# Patient Record
Sex: Male | Born: 1950 | Race: White | Hispanic: No | Marital: Married | Smoking: Never smoker
Health system: Southern US, Community
[De-identification: ages and names within clinical notes are randomized; demographics above are authoritative.]

## PROBLEM LIST (undated history)

## (undated) DIAGNOSIS — Z789 Other specified health status: Secondary | ICD-10-CM

## (undated) DIAGNOSIS — R079 Chest pain, unspecified: Secondary | ICD-10-CM

## (undated) DIAGNOSIS — R739 Hyperglycemia, unspecified: Secondary | ICD-10-CM

## (undated) DIAGNOSIS — E785 Hyperlipidemia, unspecified: Secondary | ICD-10-CM

## (undated) DIAGNOSIS — I340 Nonrheumatic mitral (valve) insufficiency: Secondary | ICD-10-CM

## (undated) DIAGNOSIS — Z951 Presence of aortocoronary bypass graft: Secondary | ICD-10-CM

## (undated) DIAGNOSIS — I214 Non-ST elevation (NSTEMI) myocardial infarction: Secondary | ICD-10-CM

## (undated) DIAGNOSIS — I2510 Atherosclerotic heart disease of native coronary artery without angina pectoris: Secondary | ICD-10-CM

## (undated) DIAGNOSIS — I10 Essential (primary) hypertension: Secondary | ICD-10-CM

## (undated) DIAGNOSIS — M1990 Unspecified osteoarthritis, unspecified site: Secondary | ICD-10-CM

## (undated) HISTORY — DX: Presence of aortocoronary bypass graft: Z95.1

## (undated) HISTORY — PX: HERNIA REPAIR: SHX51

## (undated) HISTORY — DX: Essential (primary) hypertension: I10

## (undated) HISTORY — DX: Non-ST elevation (NSTEMI) myocardial infarction: I21.4

## (undated) HISTORY — DX: Hyperglycemia, unspecified: R73.9

## (undated) HISTORY — DX: Nonrheumatic mitral (valve) insufficiency: I34.0

## (undated) HISTORY — DX: Atherosclerotic heart disease of native coronary artery without angina pectoris: I25.10

## (undated) HISTORY — DX: Chest pain, unspecified: R07.9

## (undated) HISTORY — PX: TONSILLECTOMY: SUR1361

---

## 1997-11-25 ENCOUNTER — Ambulatory Visit (HOSPITAL_BASED_OUTPATIENT_CLINIC_OR_DEPARTMENT_OTHER): Admission: RE | Admit: 1997-11-25 | Discharge: 1997-11-25 | Payer: Self-pay | Admitting: General Surgery

## 1997-12-05 ENCOUNTER — Ambulatory Visit (HOSPITAL_COMMUNITY): Admission: RE | Admit: 1997-12-05 | Discharge: 1997-12-05 | Payer: Self-pay | Admitting: Urology

## 1998-04-06 ENCOUNTER — Ambulatory Visit (HOSPITAL_BASED_OUTPATIENT_CLINIC_OR_DEPARTMENT_OTHER): Admission: RE | Admit: 1998-04-06 | Discharge: 1998-04-06 | Payer: Self-pay | Admitting: Surgery

## 2003-01-11 ENCOUNTER — Encounter: Admission: RE | Admit: 2003-01-11 | Discharge: 2003-01-11 | Payer: Self-pay | Admitting: Internal Medicine

## 2003-01-11 ENCOUNTER — Encounter: Payer: Self-pay | Admitting: Internal Medicine

## 2003-07-30 HISTORY — PX: KNEE SURGERY: SHX244

## 2004-01-05 ENCOUNTER — Ambulatory Visit (HOSPITAL_BASED_OUTPATIENT_CLINIC_OR_DEPARTMENT_OTHER): Admission: RE | Admit: 2004-01-05 | Discharge: 2004-01-05 | Payer: Self-pay | Admitting: Surgery

## 2004-01-05 ENCOUNTER — Ambulatory Visit (HOSPITAL_COMMUNITY): Admission: RE | Admit: 2004-01-05 | Discharge: 2004-01-05 | Payer: Self-pay | Admitting: Surgery

## 2004-06-28 ENCOUNTER — Ambulatory Visit (HOSPITAL_COMMUNITY): Admission: RE | Admit: 2004-06-28 | Discharge: 2004-06-28 | Payer: Self-pay | Admitting: *Deleted

## 2013-11-29 ENCOUNTER — Other Ambulatory Visit: Payer: Self-pay | Admitting: Physical Medicine and Rehabilitation

## 2013-11-29 DIAGNOSIS — M545 Low back pain, unspecified: Secondary | ICD-10-CM

## 2013-11-30 ENCOUNTER — Ambulatory Visit
Admission: RE | Admit: 2013-11-30 | Discharge: 2013-11-30 | Disposition: A | Discharge status: Home / Self Care | Payer: Self-pay | Source: Ambulatory Visit | Attending: Physical Medicine and Rehabilitation | Admitting: Physical Medicine and Rehabilitation

## 2013-11-30 ENCOUNTER — Other Ambulatory Visit: Payer: Self-pay

## 2013-11-30 DIAGNOSIS — M545 Low back pain, unspecified: Secondary | ICD-10-CM

## 2014-01-07 ENCOUNTER — Other Ambulatory Visit: Payer: Self-pay

## 2014-01-07 ENCOUNTER — Other Ambulatory Visit: Payer: Self-pay | Admitting: Orthopedic Surgery

## 2014-01-07 DIAGNOSIS — M545 Low back pain, unspecified: Secondary | ICD-10-CM

## 2014-01-26 ENCOUNTER — Other Ambulatory Visit (HOSPITAL_COMMUNITY): Payer: Self-pay | Admitting: Orthopedic Surgery

## 2014-01-26 DIAGNOSIS — M545 Low back pain, unspecified: Secondary | ICD-10-CM

## 2014-03-01 ENCOUNTER — Encounter (HOSPITAL_COMMUNITY): Admission: RE | Payer: Self-pay | Source: Ambulatory Visit

## 2014-03-01 ENCOUNTER — Ambulatory Visit (HOSPITAL_COMMUNITY): Admission: RE | Admit: 2014-03-01 | Payer: Self-pay | Source: Ambulatory Visit

## 2014-03-01 ENCOUNTER — Ambulatory Visit (HOSPITAL_COMMUNITY): Payer: Self-pay

## 2014-03-01 SURGERY — RADIOLOGY WITH ANESTHESIA
Anesthesia: General

## 2016-03-19 DIAGNOSIS — M25562 Pain in left knee: Secondary | ICD-10-CM | POA: Diagnosis not present

## 2016-04-16 DIAGNOSIS — M25562 Pain in left knee: Secondary | ICD-10-CM | POA: Diagnosis not present

## 2016-04-19 IMAGING — MR MR LUMBAR SPINE W/O CM
4 of 5 series · 25 of 48 positions shown · non-contrast
Comparison: None.

CLINICAL DATA: 62-year-old male with low back pain increasing.
Unable to lie supine. Initial encounter.

EXAM:
MRI LUMBAR SPINE WITHOUT CONTRAST
TECHNIQUE: Multiplanar, multisequence MR imaging of the lumbar spine was
performed. No intravenous contrast was administered.

[Series 6: T2 post-contrast · sagittal · 4.0mm · 0.55mm/px · 6 of 12 slices shown]
[im 1/12]
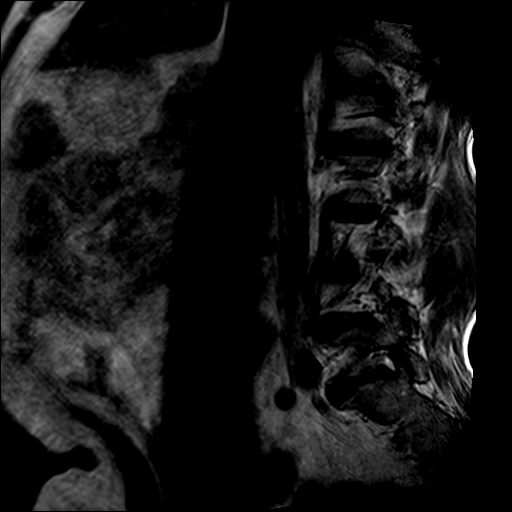
[im 3/12]
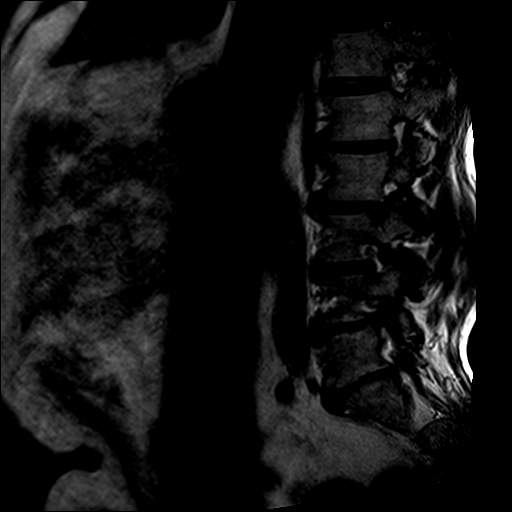
[im 5/12]
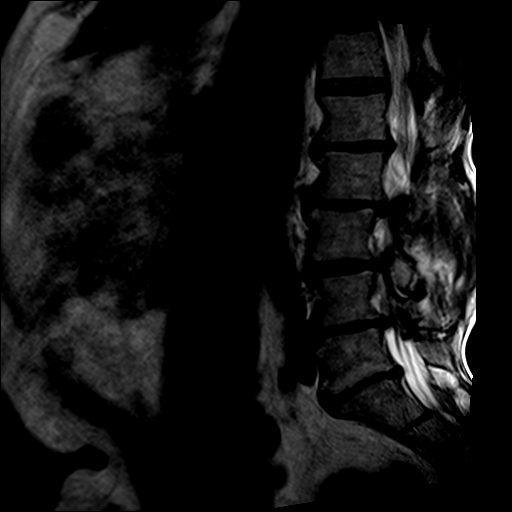
[im 7/12]
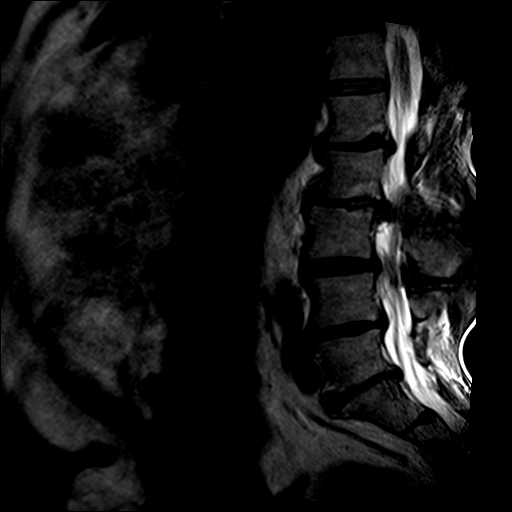
[im 9/12]
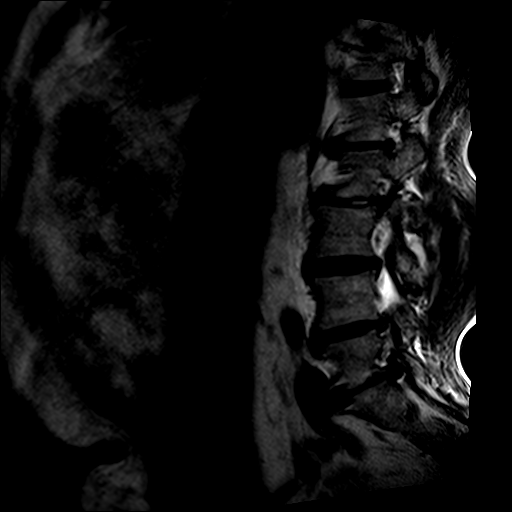
[im 12/12]
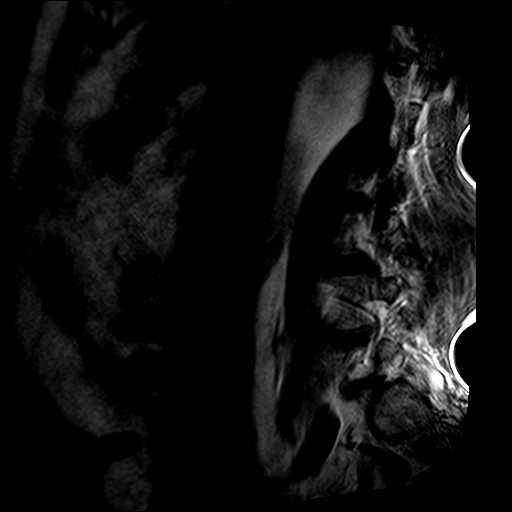

[Series 7: T1 · sagittal · 4.0mm · 0.55mm/px · 6 of 12 slices shown (1 of 2)]
[im 1/12]
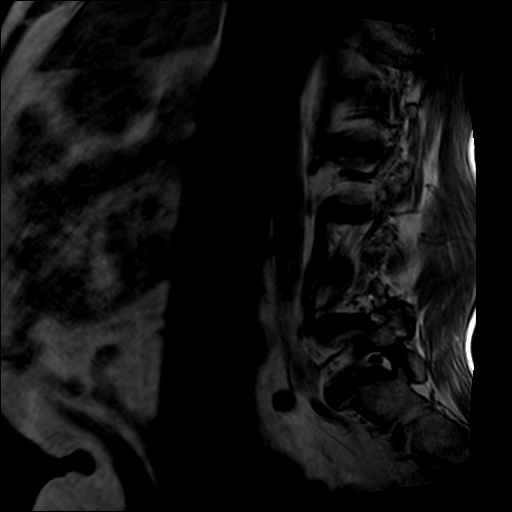
[im 3/12]
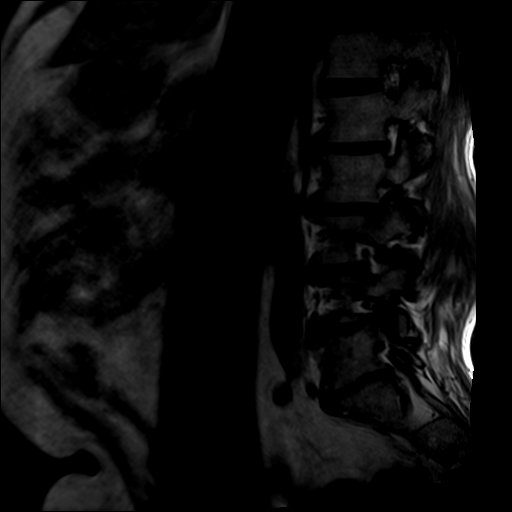
[im 5/12]
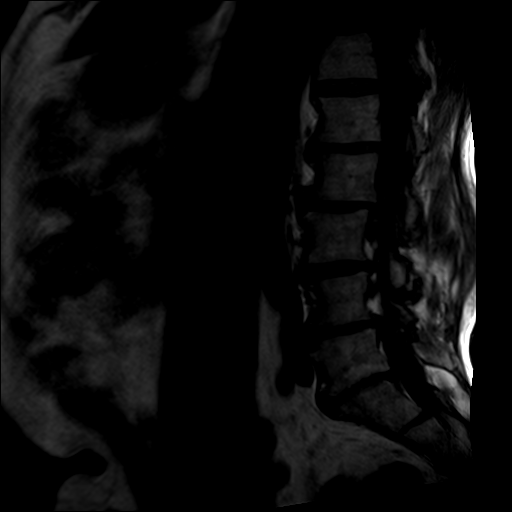
[im 7/12]
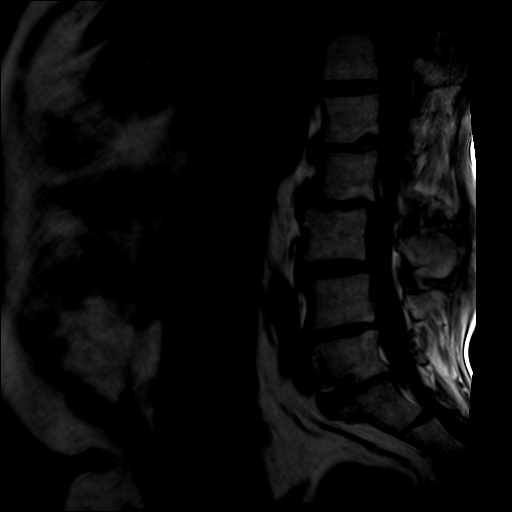
[im 9/12]
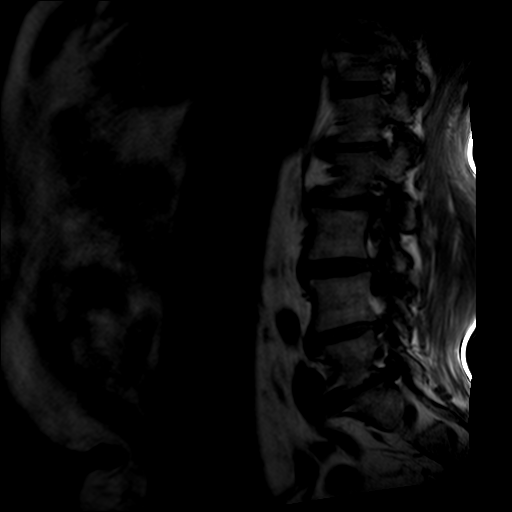
[im 12/12]
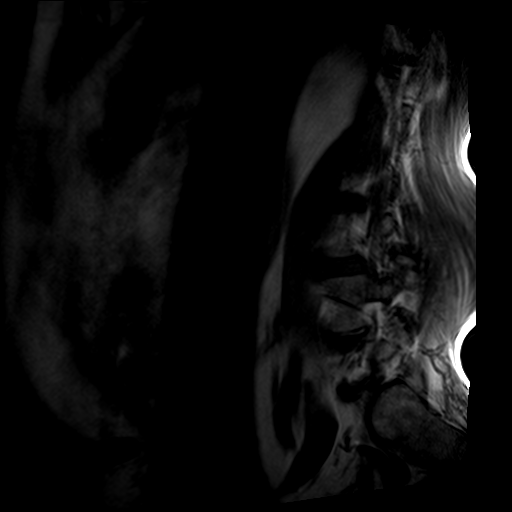

[Series 9: T1 · axial · 4.0mm · 0.35mm/px · z∈[-160,-17]mm · 4 of 32 slices shown (2 of 2)]
[im 1/32]
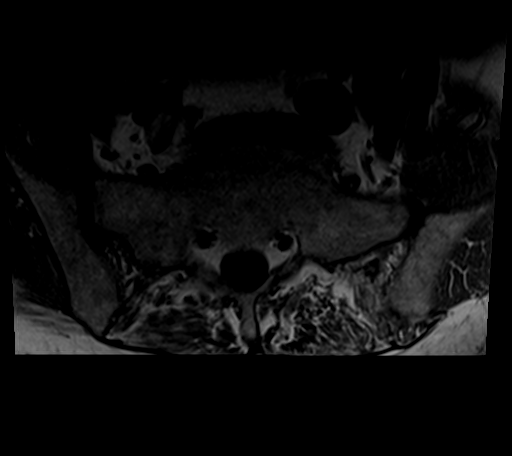
[im 5/32]
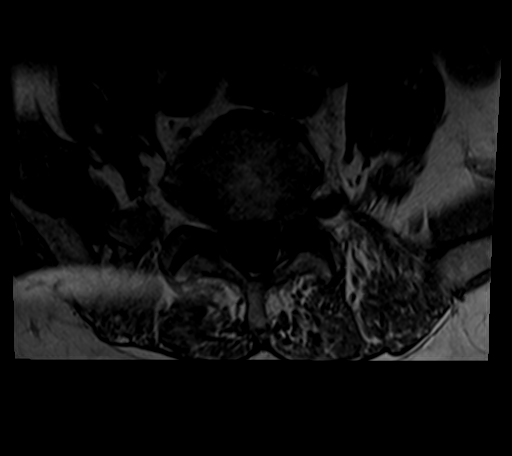
[im 16/32]
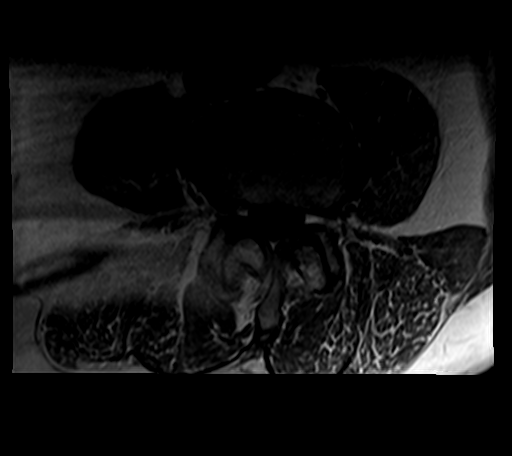
[im 27/32]
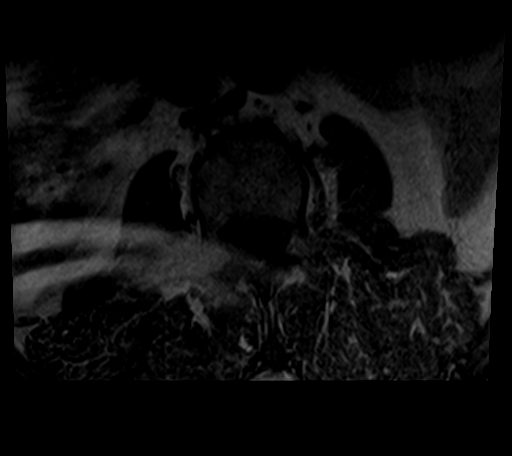

[Series 10: T2 · axial · 4.0mm · 0.70mm/px · z∈[-160,+8]mm · 9 of 32 slices shown]
[im 1/32]
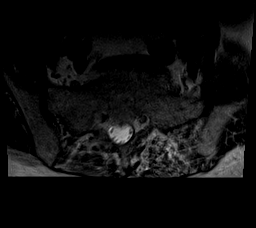
[im 5/32]
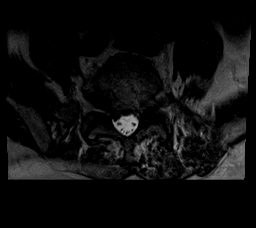
[im 9/32]
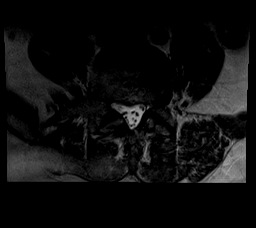
[im 14/32]
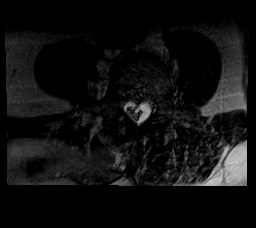
[im 16/32]
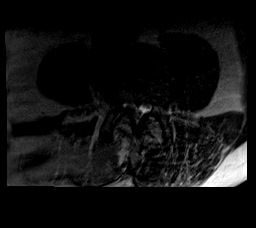
[im 18/32]
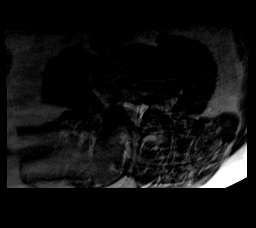
[im 23/32]
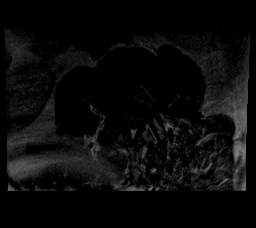
[im 27/32]
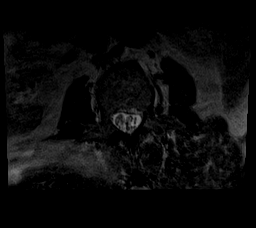
[im 32/32]
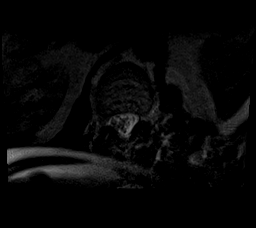

[25 of 48 positions shown; findings below may reference images not displayed]

FINDINGS: The patient was scanned in the right lateral decubitus position due
to severe pain when supine.

Lumbar segmentation appears to be normal and will be designated as
such for this report. Trace retrolisthesis at several levels,
including L1-L2 and L2-L3. No marrow edema or evidence of acute
osseous abnormality.

Visualized lower thoracic spinal cord is normal with conus medularis
at L1.

Visualized abdominal viscera and paraspinal soft tissues are within
normal limits.

T11-T12:  Partially visible, grossly negative.

T12-L1:  Negative.

L1-L2: Disc space loss. Circumferential disc bulge. Superimposed
left paracentral and foraminal disc protrusion. Mild facet
hypertrophy. Borderline spinal stenosis.

L2-L3: Disc space loss. Abnormal increased STIR signal in the disc
space. Circumferential disc bulge. Superimposed left lateral recess
disc extrusion (best visualized on series 11, image 8). Superimposed
mild to moderate facet and ligament flavum hypertrophy severe left
lateral recess stenosis, and severe spinal stenosis overall.

There is also mild to moderate bilateral L2 foraminal stenosis
related to combined disc and endplate spurring.

L3-L4: Disc space loss. Circumferential disc bulge. Moderate facet
and ligament flavum hypertrophy. Mild spinal stenosis. Mild right
greater than left L3 foraminal stenosis.

L4-L5: Disc space loss. Disc bulge. Mild to moderate facet
hypertrophy. Trace facet joint fluid. Mild right lateral recess
stenosis. Mild left and moderate right L4 foraminal stenosis.

L5-S1: Disc space loss. Circumferential disc osteophyte complex.
Mild facet hypertrophy. Mild right lateral recess stenosis. Moderate
bilateral L5 foraminal stenosis.
IMPRESSION: 1. The patient was scanned in the right lateral decubitus position
due to severe pain when supine.
2. The most abnormal level is L2-L3 where severe spinal and left
lateral recess stenosis appears do in large part to a left
paracentral and caudal disc herniation (best seen on series 11,
image 8).
3. Widespread disc degeneration elsewhere. Multifactorial borderline
to mild spinal stenosis at L1-L2 and L3-L4. Multifactorial moderate
neural foraminal stenosis at the bilateral L2, right L4, and
bilateral L5 nerve levels.

## 2016-04-24 DIAGNOSIS — M25562 Pain in left knee: Secondary | ICD-10-CM | POA: Diagnosis not present

## 2016-04-29 DIAGNOSIS — M25562 Pain in left knee: Secondary | ICD-10-CM | POA: Diagnosis not present

## 2016-07-15 DIAGNOSIS — M170 Bilateral primary osteoarthritis of knee: Secondary | ICD-10-CM | POA: Diagnosis not present

## 2016-07-15 DIAGNOSIS — M25562 Pain in left knee: Secondary | ICD-10-CM | POA: Diagnosis not present

## 2016-07-15 DIAGNOSIS — M25561 Pain in right knee: Secondary | ICD-10-CM | POA: Diagnosis not present

## 2016-07-24 DIAGNOSIS — M170 Bilateral primary osteoarthritis of knee: Secondary | ICD-10-CM | POA: Diagnosis not present

## 2016-07-24 DIAGNOSIS — M25562 Pain in left knee: Secondary | ICD-10-CM | POA: Diagnosis not present

## 2016-07-24 DIAGNOSIS — M25561 Pain in right knee: Secondary | ICD-10-CM | POA: Diagnosis not present

## 2016-07-31 DIAGNOSIS — M1712 Unilateral primary osteoarthritis, left knee: Secondary | ICD-10-CM | POA: Diagnosis not present

## 2016-07-31 DIAGNOSIS — M25562 Pain in left knee: Secondary | ICD-10-CM | POA: Diagnosis not present

## 2016-08-01 DIAGNOSIS — M1711 Unilateral primary osteoarthritis, right knee: Secondary | ICD-10-CM | POA: Diagnosis not present

## 2016-08-01 DIAGNOSIS — M25561 Pain in right knee: Secondary | ICD-10-CM | POA: Diagnosis not present

## 2016-08-07 DIAGNOSIS — M1712 Unilateral primary osteoarthritis, left knee: Secondary | ICD-10-CM | POA: Diagnosis not present

## 2016-08-07 DIAGNOSIS — M25562 Pain in left knee: Secondary | ICD-10-CM | POA: Diagnosis not present

## 2016-08-08 DIAGNOSIS — M1711 Unilateral primary osteoarthritis, right knee: Secondary | ICD-10-CM | POA: Diagnosis not present

## 2016-08-08 DIAGNOSIS — M25561 Pain in right knee: Secondary | ICD-10-CM | POA: Diagnosis not present

## 2016-08-22 DIAGNOSIS — M25562 Pain in left knee: Secondary | ICD-10-CM | POA: Diagnosis not present

## 2016-08-22 DIAGNOSIS — M170 Bilateral primary osteoarthritis of knee: Secondary | ICD-10-CM | POA: Diagnosis not present

## 2016-08-22 DIAGNOSIS — M25561 Pain in right knee: Secondary | ICD-10-CM | POA: Diagnosis not present

## 2016-09-03 DIAGNOSIS — M25561 Pain in right knee: Secondary | ICD-10-CM | POA: Diagnosis not present

## 2016-09-03 DIAGNOSIS — M170 Bilateral primary osteoarthritis of knee: Secondary | ICD-10-CM | POA: Diagnosis not present

## 2016-09-03 DIAGNOSIS — M25562 Pain in left knee: Secondary | ICD-10-CM | POA: Diagnosis not present

## 2016-10-16 DIAGNOSIS — S83412A Sprain of medial collateral ligament of left knee, initial encounter: Secondary | ICD-10-CM | POA: Diagnosis not present

## 2016-10-16 DIAGNOSIS — M84452A Pathological fracture, left femur, initial encounter for fracture: Secondary | ICD-10-CM | POA: Diagnosis not present

## 2016-10-16 DIAGNOSIS — M23322 Other meniscus derangements, posterior horn of medial meniscus, left knee: Secondary | ICD-10-CM | POA: Diagnosis not present

## 2016-12-03 DIAGNOSIS — M23322 Other meniscus derangements, posterior horn of medial meniscus, left knee: Secondary | ICD-10-CM | POA: Diagnosis not present

## 2016-12-03 DIAGNOSIS — M84452D Pathological fracture, left femur, subsequent encounter for fracture with routine healing: Secondary | ICD-10-CM | POA: Diagnosis not present

## 2016-12-03 DIAGNOSIS — R2242 Localized swelling, mass and lump, left lower limb: Secondary | ICD-10-CM | POA: Diagnosis not present

## 2016-12-03 DIAGNOSIS — S83412D Sprain of medial collateral ligament of left knee, subsequent encounter: Secondary | ICD-10-CM | POA: Diagnosis not present

## 2016-12-12 DIAGNOSIS — R2242 Localized swelling, mass and lump, left lower limb: Secondary | ICD-10-CM | POA: Diagnosis not present

## 2016-12-17 DIAGNOSIS — M23322 Other meniscus derangements, posterior horn of medial meniscus, left knee: Secondary | ICD-10-CM | POA: Diagnosis not present

## 2016-12-17 DIAGNOSIS — M84452D Pathological fracture, left femur, subsequent encounter for fracture with routine healing: Secondary | ICD-10-CM | POA: Diagnosis not present

## 2016-12-17 DIAGNOSIS — R2242 Localized swelling, mass and lump, left lower limb: Secondary | ICD-10-CM | POA: Diagnosis not present

## 2016-12-17 DIAGNOSIS — S83412D Sprain of medial collateral ligament of left knee, subsequent encounter: Secondary | ICD-10-CM | POA: Diagnosis not present

## 2017-04-11 DIAGNOSIS — M79671 Pain in right foot: Secondary | ICD-10-CM | POA: Diagnosis not present

## 2017-04-11 DIAGNOSIS — M7661 Achilles tendinitis, right leg: Secondary | ICD-10-CM | POA: Diagnosis not present

## 2017-04-11 DIAGNOSIS — M7731 Calcaneal spur, right foot: Secondary | ICD-10-CM | POA: Diagnosis not present

## 2017-04-11 DIAGNOSIS — M71571 Other bursitis, not elsewhere classified, right ankle and foot: Secondary | ICD-10-CM | POA: Diagnosis not present

## 2017-05-12 ENCOUNTER — Inpatient Hospital Stay (HOSPITAL_COMMUNITY)
Admission: EM | Admit: 2017-05-12 | Discharge: 2017-05-24 | DRG: 234 | Disposition: A | Discharge status: Home / Self Care | Payer: PPO | Attending: Cardiothoracic Surgery | Admitting: Cardiothoracic Surgery

## 2017-05-12 ENCOUNTER — Emergency Department (HOSPITAL_COMMUNITY): Payer: PPO

## 2017-05-12 ENCOUNTER — Encounter (HOSPITAL_COMMUNITY): Payer: Self-pay

## 2017-05-12 DIAGNOSIS — I700 Atherosclerosis of aorta: Secondary | ICD-10-CM | POA: Diagnosis present

## 2017-05-12 DIAGNOSIS — Z4682 Encounter for fitting and adjustment of non-vascular catheter: Secondary | ICD-10-CM | POA: Diagnosis not present

## 2017-05-12 DIAGNOSIS — I25110 Atherosclerotic heart disease of native coronary artery with unstable angina pectoris: Secondary | ICD-10-CM | POA: Diagnosis present

## 2017-05-12 DIAGNOSIS — R531 Weakness: Secondary | ICD-10-CM | POA: Diagnosis not present

## 2017-05-12 DIAGNOSIS — I214 Non-ST elevation (NSTEMI) myocardial infarction: Principal | ICD-10-CM | POA: Diagnosis present

## 2017-05-12 DIAGNOSIS — E663 Overweight: Secondary | ICD-10-CM | POA: Diagnosis present

## 2017-05-12 DIAGNOSIS — J939 Pneumothorax, unspecified: Secondary | ICD-10-CM | POA: Diagnosis not present

## 2017-05-12 DIAGNOSIS — I2510 Atherosclerotic heart disease of native coronary artery without angina pectoris: Secondary | ICD-10-CM

## 2017-05-12 DIAGNOSIS — Z951 Presence of aortocoronary bypass graft: Secondary | ICD-10-CM

## 2017-05-12 DIAGNOSIS — R079 Chest pain, unspecified: Secondary | ICD-10-CM | POA: Diagnosis not present

## 2017-05-12 DIAGNOSIS — R946 Abnormal results of thyroid function studies: Secondary | ICD-10-CM | POA: Diagnosis present

## 2017-05-12 DIAGNOSIS — G4700 Insomnia, unspecified: Secondary | ICD-10-CM | POA: Diagnosis not present

## 2017-05-12 DIAGNOSIS — I2584 Coronary atherosclerosis due to calcified coronary lesion: Secondary | ICD-10-CM | POA: Diagnosis present

## 2017-05-12 DIAGNOSIS — E785 Hyperlipidemia, unspecified: Secondary | ICD-10-CM | POA: Diagnosis present

## 2017-05-12 DIAGNOSIS — D62 Acute posthemorrhagic anemia: Secondary | ICD-10-CM | POA: Diagnosis not present

## 2017-05-12 DIAGNOSIS — I208 Other forms of angina pectoris: Secondary | ICD-10-CM | POA: Diagnosis present

## 2017-05-12 DIAGNOSIS — I272 Pulmonary hypertension, unspecified: Secondary | ICD-10-CM | POA: Diagnosis present

## 2017-05-12 DIAGNOSIS — Z9689 Presence of other specified functional implants: Secondary | ICD-10-CM

## 2017-05-12 DIAGNOSIS — E8770 Fluid overload, unspecified: Secondary | ICD-10-CM | POA: Diagnosis not present

## 2017-05-12 DIAGNOSIS — R030 Elevated blood-pressure reading, without diagnosis of hypertension: Secondary | ICD-10-CM

## 2017-05-12 DIAGNOSIS — Z8249 Family history of ischemic heart disease and other diseases of the circulatory system: Secondary | ICD-10-CM

## 2017-05-12 DIAGNOSIS — M79602 Pain in left arm: Secondary | ICD-10-CM | POA: Diagnosis not present

## 2017-05-12 DIAGNOSIS — I5030 Unspecified diastolic (congestive) heart failure: Secondary | ICD-10-CM | POA: Diagnosis not present

## 2017-05-12 DIAGNOSIS — I219 Acute myocardial infarction, unspecified: Secondary | ICD-10-CM

## 2017-05-12 DIAGNOSIS — I10 Essential (primary) hypertension: Secondary | ICD-10-CM | POA: Diagnosis present

## 2017-05-12 DIAGNOSIS — Z09 Encounter for follow-up examination after completed treatment for conditions other than malignant neoplasm: Secondary | ICD-10-CM

## 2017-05-12 DIAGNOSIS — J9811 Atelectasis: Secondary | ICD-10-CM | POA: Diagnosis not present

## 2017-05-12 DIAGNOSIS — E7800 Pure hypercholesterolemia, unspecified: Secondary | ICD-10-CM | POA: Diagnosis not present

## 2017-05-12 DIAGNOSIS — I25119 Atherosclerotic heart disease of native coronary artery with unspecified angina pectoris: Secondary | ICD-10-CM | POA: Diagnosis not present

## 2017-05-12 DIAGNOSIS — Z01810 Encounter for preprocedural cardiovascular examination: Secondary | ICD-10-CM | POA: Diagnosis not present

## 2017-05-12 DIAGNOSIS — R7989 Other specified abnormal findings of blood chemistry: Secondary | ICD-10-CM | POA: Diagnosis not present

## 2017-05-12 DIAGNOSIS — I2583 Coronary atherosclerosis due to lipid rich plaque: Secondary | ICD-10-CM | POA: Diagnosis not present

## 2017-05-12 DIAGNOSIS — I25118 Atherosclerotic heart disease of native coronary artery with other forms of angina pectoris: Secondary | ICD-10-CM | POA: Diagnosis not present

## 2017-05-12 DIAGNOSIS — Z6825 Body mass index (BMI) 25.0-25.9, adult: Secondary | ICD-10-CM | POA: Diagnosis not present

## 2017-05-12 DIAGNOSIS — I083 Combined rheumatic disorders of mitral, aortic and tricuspid valves: Secondary | ICD-10-CM | POA: Diagnosis not present

## 2017-05-12 DIAGNOSIS — R0602 Shortness of breath: Secondary | ICD-10-CM | POA: Diagnosis not present

## 2017-05-12 HISTORY — DX: Other specified health status: Z78.9

## 2017-05-12 HISTORY — DX: Chest pain, unspecified: R07.9

## 2017-05-12 LAB — BASIC METABOLIC PANEL
Anion gap: 10 (ref 5–15)
BUN: 12 mg/dL (ref 6–20)
CHLORIDE: 105 mmol/L (ref 101–111)
CO2: 25 mmol/L (ref 22–32)
Calcium: 9.1 mg/dL (ref 8.9–10.3)
Creatinine, Ser: 1.09 mg/dL (ref 0.61–1.24)
GFR calc Af Amer: >60 mL/min (ref >60)
GFR calc non Af Amer: >60 mL/min (ref >60)
Glucose, Bld: 99 mg/dL (ref 65–99)
POTASSIUM: 3.8 mmol/L (ref 3.5–5.1)
SODIUM: 140 mmol/L (ref 135–145)

## 2017-05-12 LAB — CBC
HEMATOCRIT: 44 % (ref 39.0–52.0)
Hemoglobin: 14.7 g/dL (ref 13.0–17.0)
MCH: 30.2 pg (ref 26.0–34.0)
MCHC: 33.4 g/dL (ref 30.0–36.0)
MCV: 90.3 fL (ref 78.0–100.0)
Platelets: 308 10*3/uL (ref 150–400)
RBC: 4.87 MIL/uL (ref 4.22–5.81)
RDW: 12.4 % (ref 11.5–15.5)
WBC: 6.6 10*3/uL (ref 4.0–10.5)

## 2017-05-12 LAB — TROPONIN I
Troponin I: <0.03 ng/mL (ref <0.03)
Troponin I: 0.03 ng/mL (ref <0.03)
Troponin I: 0.07 ng/mL (ref <0.03)

## 2017-05-12 LAB — LIPID PANEL
CHOL/HDL RATIO: 4.8 ratio
CHOLESTEROL: 210 mg/dL — AB (ref 0–200)
HDL: 44 mg/dL (ref >40)
LDL Cholesterol: 141 mg/dL — ABNORMAL HIGH (ref 0–99)
Triglycerides: 124 mg/dL (ref <150)
VLDL: 25 mg/dL (ref 0–40)

## 2017-05-12 MED ORDER — ENOXAPARIN SODIUM 40 MG/0.4ML ~~LOC~~ SOLN: 40.0000 mg | SUBCUTANEOUS | Status: DC

## 2017-05-12 MED ORDER — ACETAMINOPHEN 325 MG PO TABS: 650.0000 mg | ORAL_TABLET | ORAL | Status: DC | PRN

## 2017-05-12 MED ORDER — ASPIRIN 81 MG PO CHEW: 81.0000 mg | CHEWABLE_TABLET | Freq: Once | ORAL | Status: DC

## 2017-05-12 MED ORDER — ASPIRIN 81 MG PO CHEW
324.0000 mg | CHEWABLE_TABLET | Freq: Once | ORAL | Status: AC
  Administered 2017-05-12: 324 mg via ORAL
  Filled 2017-05-12: qty 4

## 2017-05-12 MED ORDER — ONDANSETRON HCL 4 MG/2ML IJ SOLN: 4.0000 mg | Freq: Four times a day (QID) | INTRAMUSCULAR | Status: DC | PRN

## 2017-05-12 NOTE — ED Notes (Signed)
PA made aware of critical troponin, no further orders at this time. Will cont to monitor pt.

## 2017-05-12 NOTE — ED Provider Notes (Signed)
[facility] EMERGENCY DEPARTMENT Provider Note   CSN: [id number] Arrival date & time: 05/12/17  1059     History   Chief Complaint Chief Complaint  Patient presents with  . Chest Pain    HPI Jason Lewis is a 66 y.o. male.  HPI Patient presents to the emergency department with chest discomfort that is been intermittent over the last week.  The patient states that he has been having episodes of chest discomfort that lasts for 15-20 minutes at a time and are not necessarily associated with activity.  The patient states that he also will get some weakness along with mild shortness of breath. Patient states that he did not take any medications prior to arrival.  Patient states that symptoms are somewhat vague, and he has a hard time describing themThe patient denies  headache,blurred vision, neck pain, fever, cough,  numbness, dizziness, anorexia, edema, abdominal pain, nausea, vomiting, diarrhea, rash, back pain, dysuria, hematemesis, bloody stool, near syncope, or syncope. History reviewed. No pertinent past medical history.  There are no active problems to display for this patient.   History reviewed. No pertinent surgical history.     Home Medications    Prior to Admission medications   Medication Sig Start Date End Date Taking? Authorizing Provider  ibuprofen (ADVIL,MOTRIN) 200 MG tablet Take 200 mg by mouth every 6 (six) hours as needed for mild pain.   Yes [provider]    Family History History reviewed. No pertinent family history.  Social History Social History  Substance Use Topics  . Smoking status: Never Smoker  . Smokeless tobacco: Not on file  . Alcohol use Yes     Comment: occ     Allergies   Patient has no known allergies.   Review of Systems Review of Systems All other systems negative except as documented in the HPI. All pertinent positives and negatives as reviewed in the HPI.  Physical Exam Updated Vital  Signs BP (!) 156/86 (BP Location: Right Arm)   Pulse 73   Temp 98.8 F (37.1 C) (Oral)   Resp 18   Ht 5\' 8"  (1.727 m)   Wt 77.1 kg (170 lb)   SpO2 97%   BMI 25.85 kg/m   Physical Exam  Constitutional: He is oriented to person, place, and time. He appears well-developed and well-nourished. No distress.  HENT:  Head: Normocephalic and atraumatic.  Mouth/Throat: Oropharynx is clear and moist.  Eyes: Pupils are equal, round, and reactive to light.  Neck: Normal range of motion. Neck supple.  Cardiovascular: Normal rate, regular rhythm and normal heart sounds.  Exam reveals no gallop and no friction rub.   No murmur heard. Pulmonary/Chest: Effort normal and breath sounds normal. No respiratory distress. He has no wheezes.  Abdominal: Soft. Bowel sounds are normal. He exhibits no distension. There is no tenderness.  Neurological: He is alert and oriented to person, place, and time. He exhibits normal muscle tone. Coordination normal.  Skin: Skin is warm and dry. Capillary refill takes less than 2 seconds. No rash noted. No erythema.  Psychiatric: He has a normal mood and affect. His behavior is normal.  Nursing note and vitals reviewed.    ED Treatments / Results  Labs (all labs ordered are listed, but only abnormal results are displayed) Labs Reviewed  TROPONIN I - Abnormal; Notable for the following:       Result Value   Troponin I 0.03 (*)    All other components within  normal limits  BASIC METABOLIC PANEL  CBC  TROPONIN I  TROPONIN I    EKG  EKG Interpretation  Date/Time:  Monday May 12 2017 15:51:07 EDT Ventricular Rate:  66 PR Interval:  118 QRS Duration: 90 QT Interval:  402 QTC Calculation: 421 R Axis:   64 Text Interpretation:  Normal sinus rhythm Normal ECG No significant change since last tracing earlier today Confirmed by Dorie Rank [phone]) on 05/12/2017 3:56:01 PM       Radiology Dg Chest 2 View  Result Date: 05/12/2017 CLINICAL DATA:  Patient  had an episode of chest pain radiating to the left shoulder 3 days ago with recurrent symptoms this morning. Persistent left shoulder achiness. No cardiac history. Never smoked. EXAM: CHEST  2 VIEW COMPARISON:  None in PACs FINDINGS: The lungs are adequately inflated and clear. The heart and pulmonary vascularity are normal. The mediastinum is normal in width. The trachea is midline. There is calcification in the wall of the aortic arch. The bony thorax is unremarkable. IMPRESSION: There is no acute cardiopulmonary abnormality. Thoracic aortic atherosclerosis. Electronically Signed   By: David  Martinique M.D.   On: 05/12/2017 11:50    Procedures Procedures (including critical care time)  Medications Ordered in ED Medications - No data to display   Initial Impression / Assessment and Plan / ED Course  I have reviewed the triage vital signs and the nursing notes.  Pertinent labs & imaging results that were available during my care of the patient were reviewed by me and considered in my medical decision making (see chart for details).    I initially spoke with cardiology about the patient and they were going to see him in their office the following day, but then C Thayer Headings second troponin which was 0.03.  I then reconsulted with them and advised a third troponin which came back at 0.07.  I spoke with cardiology about the patient and they came and saw him in the car, along with the [facility].  I advised the patient that with these changes in his troponin. We are concerned about a cardiac etiology.  I advised him that his story is very concerning, as well even though hedoes not have a lot of medical problems.  The patient does have a family history of cardiac issues.   Final Clinical Impressions(s) / ED Diagnoses   Final diagnoses:  None    New Prescriptions New Prescriptions   No medications on file     Dalia Heading, Hershal Coria 05/14/17 0041    Dorie Rank, MD 05/14/17 [phone]

## 2017-05-12 NOTE — H&P (Signed)
History and Physical    Jason Lewis JSE:[id number] DOB: 30-Dec-1950 DOA: 05/12/2017  PCP: Deland Pretty, MD  Patient coming from: Home  I have personally briefly reviewed patient's old medical records in [place]  Chief Complaint: Chest pain  HPI: Jason Lewis is a 66 y.o. male with medical history significant of previously healthy.  His father did have h/o angina and his brother has h/o MI and CABG though.  Patient presents to the ED with c/o CP, SOB, L arm pain.  Episodic.  Onset 1 week ago.  Each episode 15 mins or so and may not have symptoms for 2 days.  Had episode this morning while working.  Felt he needed to come in for eval.   ED Course: EKG nl, Trop did elevate over course of 6 hours to 0.07.   Review of Systems: As per HPI otherwise 10 point review of systems negative.   History reviewed. No pertinent past medical history.  History reviewed. No pertinent surgical history.   reports that he has never smoked. He does not have any smokeless tobacco history on file. He reports that he drinks alcohol. He reports that he does not use drugs.  No Known Allergies  Family History  Problem Relation Age of Onset  . Angina Father   . Coronary artery disease Brother        s/p CABG     Prior to Admission medications   Medication Sig Start Date End Date Taking? Authorizing Provider  ibuprofen (ADVIL,MOTRIN) 200 MG tablet Take 200 mg by mouth every 6 (six) hours as needed for mild pain.   Yes [provider]    Physical Exam: Vitals:   05/12/17 1119 05/12/17 1120 05/12/17 1938 05/12/17 2045  BP: (!) 156/86  (!) 146/83 (!) 155/93  Pulse: 73  64 62  Resp: 18  14 13   Temp: 98.8 F (37.1 C)  98.2 F (36.8 C)   TempSrc: Oral  Oral   SpO2: 97%  96% 97%  Weight:  77.1 kg (170 lb)    Height:  5\' 8"  (1.727 m)      Constitutional: NAD, calm, comfortable Eyes: PERRL, lids and conjunctivae normal ENMT: Mucous membranes are moist. Posterior pharynx  clear of any exudate or lesions.Normal dentition.  Neck: normal, supple, no masses, no thyromegaly Respiratory: clear to auscultation bilaterally, no wheezing, no crackles. Normal respiratory effort. No accessory muscle use.  Cardiovascular: Regular rate and rhythm, no murmurs / rubs / gallops. No extremity edema. 2+ pedal pulses. No carotid bruits.  Abdomen: no tenderness, no masses palpated. No hepatosplenomegaly. Bowel sounds positive.  Musculoskeletal: no clubbing / cyanosis. No joint deformity upper and lower extremities. Good ROM, no contractures. Normal muscle tone.  Skin: no rashes, lesions, ulcers. No induration Neurologic: CN 2-12 grossly intact. Sensation intact, DTR normal. Strength 5/5 in all 4.  Psychiatric: Normal judgment and insight. Alert and oriented x 3. Normal mood.    Labs on Admission: I have personally reviewed following labs and imaging studies  CBC:  Recent Labs Lab 05/12/17 1127  WBC 6.6  HGB 14.7  HCT 44.0  MCV 90.3  PLT 073   Basic Metabolic Panel:  Recent Labs Lab 05/12/17 1127  NA 140  K 3.8  CL 105  CO2 25  GLUCOSE 99  BUN 12  CREATININE 1.09  CALCIUM 9.1   GFR: Estimated Creatinine Clearance: 63.6 mL/min (by C-G formula based on SCr of 1.09 mg/dL). Liver Function Tests: No results for input(s):  AST, ALT, ALKPHOS, BILITOT, PROT, ALBUMIN in the last 168 hours. No results for input(s): LIPASE, AMYLASE in the last 168 hours. No results for input(s): AMMONIA in the last 168 hours. Coagulation Profile: No results for input(s): INR, PROTIME in the last 168 hours. Cardiac Enzymes:  Recent Labs Lab 05/12/17 1127 05/12/17 1546 05/12/17 1832  TROPONINI <0.03 0.03* 0.07*   BNP (last 3 results) No results for input(s): PROBNP in the last 8760 hours. HbA1C: No results for input(s): HGBA1C in the last 72 hours. CBG: No results for input(s): GLUCAP in the last 168 hours. Lipid Profile: No results for input(s): CHOL, HDL, LDLCALC, TRIG,  CHOLHDL, LDLDIRECT in the last 72 hours. Thyroid Function Tests: No results for input(s): TSH, T4TOTAL, FREET4, T3FREE, THYROIDAB in the last 72 hours. Anemia Panel: No results for input(s): VITAMINB12, FOLATE, FERRITIN, TIBC, IRON, RETICCTPCT in the last 72 hours. Urine analysis: No results found for: COLORURINE, APPEARANCEUR, LABSPEC, PHURINE, GLUCOSEU, HGBUR, BILIRUBINUR, KETONESUR, PROTEINUR, UROBILINOGEN, NITRITE, LEUKOCYTESUR  Radiological Exams on Admission: Dg Chest 2 View  Result Date: 05/12/2017 CLINICAL DATA:  Patient had an episode of chest pain radiating to the left shoulder 3 days ago with recurrent symptoms this morning. Persistent left shoulder achiness. No cardiac history. Never smoked. EXAM: CHEST  2 VIEW COMPARISON:  None in PACs FINDINGS: The lungs are adequately inflated and clear. The heart and pulmonary vascularity are normal. The mediastinum is normal in width. The trachea is midline. There is calcification in the wall of the aortic arch. The bony thorax is unremarkable. IMPRESSION: There is no acute cardiopulmonary abnormality. Thoracic aortic atherosclerosis. Electronically Signed   By: David  Martinique M.D.   On: 05/12/2017 11:50    EKG: Independently reviewed.  Assessment/Plan Active Problems:   Chest pain    1. Chest pain - 1. Slight trop elevation in ED to 0.07 2. See cards note 3. CP obs pathway 4. Serial trops 5. Tele monitor 6. NPO after midnight  DVT prophylaxis: Lovenox Code Status: Full Family Communication: No family in room Disposition Plan: Home after admit Consults called: Cards Admission status: Place in obs   [facility], [facility] Pager [phone]  If 7AM-7PM, please contact day team taking care of patient [email or web address] Password [email or web address]  05/12/2017, 9:12 PM

## 2017-05-12 NOTE — ED Provider Notes (Signed)
Pt presents with episode left chest pain radiating to the shoulder.    No history of cardiac disease. Overall low risk for heart disease but sx are concerning.  Initial troponin is <0.03, second is 0.03.  Case was discussed with cardiology.  Will plan on 6 hr troponin.  If not elevated, close outpatient follow up set up.  If increasing, will admit for further workup   Medical screening examination/treatment/procedure(s) were conducted as a shared visit with non-physician practitioner(s) and myself.  I personally evaluated the patient during the encounter.   EKG Interpretation  Date/Time:  Monday May 12 2017 15:51:07 EDT Ventricular Rate:  66 PR Interval:  118 QRS Duration: 90 QT Interval:  402 QTC Calculation: 421 R Axis:   64 Text Interpretation:  Normal sinus rhythm Normal ECG No significant change since last tracing earlier today Confirmed by Dorie Rank [phone]) on 05/12/2017 3:56:01 PM         Dorie Rank, MD 05/12/17 [phone]

## 2017-05-12 NOTE — Consult Note (Signed)
Cardiology Consultation:   Patient ID: JABE JEANBAPTISTE; [id number]; 10/26/1950   Admit date: 05/12/2017 Date of Consult: 05/12/2017  Primary Care Provider: Deland Pretty, MD Primary Cardiologist: New   Patient Profile:   Jason Lewis is a 66 y.o. male with no significant past medical history who is being seen today for the evaluation of chest pain at the request of Dr. Cathi Roan.  History of Present Illness:   Jason Lewis is a 66 yr old gentleman with no significant past medical history who presented to the ED this morning with episodic chest pain. Symptoms began about a week ago while working. 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 He has been having episodic "weak spells" accompanied by retrosternal chest tightness and left arm aching. There is associated shortness of breath. Each episode may last 15 minutes and then he may not experience any symptoms for 2 days.  This morning while working, he had another episode and thought he should be evaluated.  Since coming to the ED, he has had no symptoms but has been lying in bed.  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  He has noticed more fatigue over the last several months but attributes it to aging.  He denies palpitations, leg swelling, orthopnea, and paroxysmal nocturnal dyspnea.  He hasn't seen his PCP in at least 3 years.  Troponins: <0.03-->0.03-->0.07.  ECG shows normal sinus rhythm with no ischemic ST-T abnormalities, nor any arrhythmias (2 separate ECG's were independently reviewed).  BMET and CBC are normal.  Chest xray shows thoracic aortic atherosclerosis.  He does not smoke.   History reviewed. No pertinent past medical history.  History reviewed. No pertinent surgical history.     Inpatient Medications: Scheduled Meds:  Continuous Infusions:  PRN Meds:   Allergies:   No Known Allergies  Social History:   Social History   Social History    . Marital status: Married    Spouse name: N/A  . Number of children: N/A  . Years of education: N/A   Occupational History  . Not on file.   Social History Main Topics  . Smoking status: Never Smoker  . Smokeless tobacco: Not on file  . Alcohol use Yes     Comment: occ  . Drug use: No  . Sexual activity: Not on file   Other Topics Concern  . Not on file   Social History Narrative  . No narrative on file    Family History:   Father had MI in his 59's, was then lost at sea years later. Brother had MI and CABG in 56's, then died of MI at age 67.   ROS:  Please see the history of present illness.  ROS  All other ROS reviewed and negative.     Physical Exam/Data:   Vitals:   05/12/17 1119 05/12/17 1120 05/12/17 1938  BP: (!) 156/86  (!) 146/83  Pulse: 73  64  Resp: 18  14  Temp: 98.8 F (37.1 C)  98.2 F (36.8 C)  TempSrc: Oral  Oral  SpO2: 97%  96%  Weight:  170 lb (77.1 kg)   Height:  5\' 8"  (1.727 m)    No intake or output data in the 24 hours ending 05/12/17 2030 Filed Weights   05/12/17 1120  Weight: 170 lb (77.1 kg)   Body mass index is 25.85 kg/m.  General:  Well nourished, well developed, in no acute  distress HEENT: normal Lymph: no adenopathy Neck: no JVD Endocrine:  No thryomegaly Vascular: No carotid bruits  Cardiac:  normal S1, S2; RRR; no murmur  Lungs:  clear to auscultation bilaterally, no wheezing, rhonchi or rales  Abd: soft, nontender, no hepatomegaly  Ext: no edema Musculoskeletal:  No deformities, BUE and BLE strength normal and equal Skin: warm and dry  Neuro:  No focal abnormalities noted Psych:  Normal affect     Relevant CV Studies: Pending  Laboratory Data:  Chemistry Recent Labs Lab 05/12/17 1127  NA 140  K 3.8  CL 105  CO2 25  GLUCOSE 99  BUN 12  CREATININE 1.09  CALCIUM 9.1  GFRNONAA >60  GFRAA >60  ANIONGAP 10    No results for input(s): PROT, ALBUMIN, AST, ALT, ALKPHOS, BILITOT in the last 168  hours. Hematology Recent Labs Lab 05/12/17 1127  WBC 6.6  RBC 4.87  HGB 14.7  HCT 44.0  MCV 90.3  MCH 30.2  MCHC 33.4  RDW 12.4  PLT 308   Cardiac Enzymes Recent Labs Lab 05/12/17 1127 05/12/17 1546 05/12/17 1832  TROPONINI <0.03 0.03* 0.07*   No results for input(s): TROPIPOC in the last 168 hours.  BNPNo results for input(s): BNP, PROBNP in the last 168 hours.  DDimer No results for input(s): DDIMER in the last 168 hours.  Radiology/Studies:  Dg Chest 2 View  Result Date: 05/12/2017 CLINICAL DATA:  Patient had an episode of chest pain radiating to the left shoulder 3 days ago with recurrent symptoms this morning. Persistent left shoulder achiness. No cardiac history. Never smoked. EXAM: CHEST  2 VIEW COMPARISON:  None in PACs FINDINGS: The lungs are adequately inflated and clear. The heart and pulmonary vascularity are normal. The mediastinum is normal in width. The trachea is midline. There is calcification in the wall of the aortic arch. The bony thorax is unremarkable. IMPRESSION: There is no acute cardiopulmonary abnormality. Thoracic aortic atherosclerosis. Electronically Signed   By: David  Martinique M.D.   On: 05/12/2017 11:50    Assessment and Plan:   1. Chest pain with shortness of breath, left arm pain, and episodic weakness: While ECG is normal, symptoms are suspicious for ischemic heart disease. His family history is significant for premature CAD and chest xray shows thoracic aortic atherosclerosis.  Peak troponin is 0.07. I will check 2 more, two hours apart. If they continue to rise, he may need coronary angiography. I will keep him NPO at midnight as an exercise Myoview could alternatively be pursued should he remain symptom free and enzymes decrease. BP is elevated. This needs to be monitored as he may require antihypertensive therapy. I will check a lipid panel. I will order a 2-D echocardiogram with Doppler to evaluate cardiac structure, function, and regional  wall motion. He has already had ASA.  2. Elevated blood pressure: Does not carry a diagnosis of hypertension, but hasn't seen a PCP in 3 years. This needs to be monitored as he may require antihypertensive therapy.     For questions or updates, please contact [facility] Please consult [email or web address] for contact info under Cardiology/STEMI.   Signed, Kate Sable, MD  05/12/2017 8:30 PM

## 2017-05-12 NOTE — ED Notes (Signed)
Pt provided meal and beverage. Pt will be NPO after midnight

## 2017-05-12 NOTE — ED Notes (Signed)
Dr. Alcario Drought at bedside, pt has been evaluated by cards.

## 2017-05-12 NOTE — Plan of Care (Signed)
Problem: Education: Goal: Knowledge of [facility] Education information/materials will improve Outcome: Progressing Discussed plan of care and oriented pt to unit and hospital procedures and routines.  Problem: Safety: Goal: Ability to remain free from injury will improve Outcome: Progressing Pt ambulates independently without difficulty; steady gate.  Call light, phone, urinal, and personal items within reach.  Problem: Pain Managment: Goal: General experience of comfort will improve Outcome: Progressing Denies c/o pain or discomfort at this time.

## 2017-05-12 NOTE — ED Notes (Signed)
Attempted to call report, RN will return call for report  

## 2017-05-12 NOTE — ED Triage Notes (Signed)
Pt endorses an episode of chest pain with radiation to the left shoulder 3 days ago, had same episode again this morning. Denies CP at this time but still aches in the left shoulder. Denies cardiac hx. VSS

## 2017-05-12 NOTE — Progress Notes (Deleted)
Cardiology Office Note   Date:  05/12/2017   ID:  [name], DOB [birth year], MRN [id number]  PCP:  Deland Pretty, MD  Cardiologist:   Minus Breeding, MD  Referring:  ***  No chief complaint on file.     History of Present Illness: Jason Lewis is a 66 y.o. male who presents for evaluation of chest pain.  He was in the ED recently for this.  I reviewed these records for this visit.  He was seen by our service.    He did have one elevated troponin.   ***     Past Medical History:  Diagnosis Date  . Medical history non-contributory     Past Surgical History:  Procedure Laterality Date  . HERNIA REPAIR    . KNEE SURGERY Left 2005  . TONSILLECTOMY       No current facility-administered medications for this visit.    No current outpatient prescriptions on file.   Facility-Administered Medications Ordered in Other Visits  Medication Dose Route Frequency Provider Last Rate Last Dose  . acetaminophen (TYLENOL) tablet 650 mg  650 mg Oral Q4H PRN Etta Quill, DO      . [START ON 05/13/2017] enoxaparin (LOVENOX) injection 40 mg  40 mg Subcutaneous Q24H Alcario Drought, Jared M, DO      . ondansetron [facility]) injection 4 mg  4 mg Intravenous Q6H PRN Etta Quill, DO        Allergies:   Patient has no known allergies.    Social History:  The patient  reports that he has never smoked. He does not have any smokeless tobacco history on file. He reports that he drinks alcohol. He reports that he does not use drugs.   Family History:  [name] ***family history includes Angina in his father; Coronary artery disease in his brother.    ROS:  Please see the history of present illness.   Otherwise, review of systems are positive for {NONE DEFAULTED:18576::"none"}.   All other systems are reviewed and negative.    PHYSICAL EXAM: VS:  There were no vitals taken for this visit. , BMI There is no height or weight on file to calculate BMI. GENERAL:  Well appearing HEENT:   Pupils equal round and reactive, fundi not visualized, oral mucosa unremarkable NECK:  No jugular venous distention, waveform within normal limits, carotid upstroke brisk and symmetric, no bruits, no thyromegaly LYMPHATICS:  No cervical, inguinal adenopathy LUNGS:  Clear to auscultation bilaterally BACK:  No CVA tenderness CHEST:  Unremarkable HEART:  PMI not displaced or sustained,S1 and S2 within normal limits, no S3, no S4, no clicks, no rubs, *** murmurs ABD:  Flat, positive bowel sounds normal in frequency in pitch, no bruits, no rebound, no guarding, no midline pulsatile mass, no hepatomegaly, no splenomegaly EXT:  2 plus pulses throughout, no edema, no cyanosis no clubbing SKIN:  No rashes no nodules NEURO:  Cranial nerves II through XII grossly intact, motor grossly intact throughout PSYCH:  Cognitively intact, oriented to person place and time    EKG:  EKG {ACTION; IS/IS TOI:71245809} ordered today. The ekg ordered today demonstrates ***   Recent Labs: 05/12/2017: BUN 12; Creatinine, Ser 1.09; Hemoglobin 14.7; Platelets 308; Potassium 3.8; Sodium 140    Lipid Panel    Component Value Date/Time   CHOL 210 (H) 05/12/2017 1832   TRIG 124 05/12/2017 1832   HDL 44 05/12/2017 1832   CHOLHDL 4.8 05/12/2017 1832   VLDL 25 05/12/2017  [place] 141 (H) 05/12/2017 1832      Wt Readings from Last 3 Encounters:  05/12/17 180 lb 8 oz (81.9 kg)      Other studies Reviewed: Additional studies/ records that were reviewed today include: ***. Review of the above records demonstrates:  Please see elsewhere in the note.  ***   ASSESSMENT AND PLAN:  ***   Current medicines are reviewed at length with the patient today.  The patient {ACTIONS; HAS/DOES NOT HAVE:19233} concerns regarding medicines.  The following changes have been made:  {PLAN; NO CHANGE:13088:s}  Labs/ tests ordered today include: *** No orders of the defined types were placed in this  encounter.    Disposition:   FU with ***    Signed, Minus Breeding, MD  05/12/2017 10:46 PM    [facility]

## 2017-05-13 ENCOUNTER — Observation Stay (HOSPITAL_COMMUNITY): Payer: PPO

## 2017-05-13 ENCOUNTER — Encounter (HOSPITAL_COMMUNITY)
Admission: EM | Disposition: A | Discharge status: Home / Self Care | Payer: Self-pay | Source: Home / Self Care | Attending: Cardiothoracic Surgery

## 2017-05-13 ENCOUNTER — Ambulatory Visit: Payer: PPO | Admitting: Cardiology

## 2017-05-13 ENCOUNTER — Other Ambulatory Visit: Payer: Self-pay

## 2017-05-13 DIAGNOSIS — D62 Acute posthemorrhagic anemia: Secondary | ICD-10-CM | POA: Diagnosis not present

## 2017-05-13 DIAGNOSIS — E663 Overweight: Secondary | ICD-10-CM | POA: Diagnosis present

## 2017-05-13 DIAGNOSIS — I25118 Atherosclerotic heart disease of native coronary artery with other forms of angina pectoris: Secondary | ICD-10-CM

## 2017-05-13 DIAGNOSIS — I2583 Coronary atherosclerosis due to lipid rich plaque: Secondary | ICD-10-CM | POA: Diagnosis not present

## 2017-05-13 DIAGNOSIS — I208 Other forms of angina pectoris: Secondary | ICD-10-CM | POA: Diagnosis present

## 2017-05-13 DIAGNOSIS — I5030 Unspecified diastolic (congestive) heart failure: Secondary | ICD-10-CM

## 2017-05-13 DIAGNOSIS — E7800 Pure hypercholesterolemia, unspecified: Secondary | ICD-10-CM | POA: Diagnosis not present

## 2017-05-13 DIAGNOSIS — Z8249 Family history of ischemic heart disease and other diseases of the circulatory system: Secondary | ICD-10-CM | POA: Diagnosis not present

## 2017-05-13 DIAGNOSIS — G4700 Insomnia, unspecified: Secondary | ICD-10-CM | POA: Diagnosis not present

## 2017-05-13 DIAGNOSIS — I2510 Atherosclerotic heart disease of native coronary artery without angina pectoris: Secondary | ICD-10-CM | POA: Diagnosis not present

## 2017-05-13 DIAGNOSIS — E785 Hyperlipidemia, unspecified: Secondary | ICD-10-CM | POA: Diagnosis present

## 2017-05-13 DIAGNOSIS — Z01810 Encounter for preprocedural cardiovascular examination: Secondary | ICD-10-CM | POA: Diagnosis not present

## 2017-05-13 DIAGNOSIS — I214 Non-ST elevation (NSTEMI) myocardial infarction: Secondary | ICD-10-CM | POA: Diagnosis present

## 2017-05-13 DIAGNOSIS — R079 Chest pain, unspecified: Secondary | ICD-10-CM | POA: Diagnosis not present

## 2017-05-13 DIAGNOSIS — I10 Essential (primary) hypertension: Secondary | ICD-10-CM | POA: Diagnosis present

## 2017-05-13 DIAGNOSIS — Z6825 Body mass index (BMI) 25.0-25.9, adult: Secondary | ICD-10-CM | POA: Diagnosis not present

## 2017-05-13 DIAGNOSIS — R7989 Other specified abnormal findings of blood chemistry: Secondary | ICD-10-CM | POA: Diagnosis not present

## 2017-05-13 DIAGNOSIS — E8770 Fluid overload, unspecified: Secondary | ICD-10-CM | POA: Diagnosis not present

## 2017-05-13 DIAGNOSIS — R946 Abnormal results of thyroid function studies: Secondary | ICD-10-CM | POA: Diagnosis present

## 2017-05-13 DIAGNOSIS — I272 Pulmonary hypertension, unspecified: Secondary | ICD-10-CM | POA: Diagnosis present

## 2017-05-13 DIAGNOSIS — I700 Atherosclerosis of aorta: Secondary | ICD-10-CM | POA: Diagnosis present

## 2017-05-13 DIAGNOSIS — I25110 Atherosclerotic heart disease of native coronary artery with unstable angina pectoris: Secondary | ICD-10-CM | POA: Diagnosis present

## 2017-05-13 DIAGNOSIS — J9811 Atelectasis: Secondary | ICD-10-CM | POA: Diagnosis not present

## 2017-05-13 DIAGNOSIS — I25119 Atherosclerotic heart disease of native coronary artery with unspecified angina pectoris: Secondary | ICD-10-CM | POA: Diagnosis not present

## 2017-05-13 HISTORY — PX: ULTRASOUND GUIDANCE FOR VASCULAR ACCESS: SHX6516

## 2017-05-13 HISTORY — PX: CARDIAC CATHETERIZATION: SHX172

## 2017-05-13 HISTORY — PX: LEFT HEART CATH AND CORONARY ANGIOGRAPHY: CATH118249

## 2017-05-13 LAB — TROPONIN I
TROPONIN I: 0.21 ng/mL — AB (ref <0.03)
Troponin I: 0.16 ng/mL (ref <0.03)
Troponin I: 0.11 ng/mL (ref <0.03)

## 2017-05-13 LAB — ECHOCARDIOGRAM COMPLETE
HEIGHTINCHES: 68 in
WEIGHTICAEL: 2883.2 [oz_av]

## 2017-05-13 LAB — PROTIME-INR
INR: 1.17
Prothrombin Time: 14.8 seconds (ref 11.4–15.2)

## 2017-05-13 SURGERY — LEFT HEART CATH AND CORONARY ANGIOGRAPHY
Anesthesia: LOCAL

## 2017-05-13 MED ORDER — SODIUM CHLORIDE 0.9 % IV SOLN: 250.0000 mL | INTRAVENOUS | Status: DC | PRN

## 2017-05-13 MED ORDER — ATORVASTATIN CALCIUM 80 MG PO TABS: 80.0000 mg | ORAL_TABLET | Freq: Every day | ORAL | Status: DC

## 2017-05-13 MED ORDER — NITROGLYCERIN 1 MG/10 ML FOR IR/CATH LAB
INTRA_ARTERIAL | Status: DC | PRN
  Administered 2017-05-13: 200 ug via INTRACORONARY

## 2017-05-13 MED ORDER — IOPAMIDOL (ISOVUE-370) INJECTION 76%
INTRAVENOUS | Status: AC
  Filled 2017-05-13: qty 100

## 2017-05-13 MED ORDER — ONDANSETRON HCL 4 MG/2ML IJ SOLN: 4.0000 mg | Freq: Four times a day (QID) | INTRAMUSCULAR | Status: DC | PRN

## 2017-05-13 MED ORDER — METOPROLOL TARTRATE 12.5 MG HALF TABLET
12.5000 mg | ORAL_TABLET | Freq: Two times a day (BID) | ORAL | Status: DC
  Administered 2017-05-13 – 2017-05-18 (×7): 12.5 mg via ORAL
  Filled 2017-05-13 (×10): qty 1

## 2017-05-13 MED ORDER — HEPARIN SODIUM (PORCINE) 1000 UNIT/ML IJ SOLN
INTRAMUSCULAR | Status: DC | PRN
  Administered 2017-05-13: 4000 [IU] via INTRAVENOUS

## 2017-05-13 MED ORDER — ACETAMINOPHEN 325 MG PO TABS: 650.0000 mg | ORAL_TABLET | ORAL | Status: DC | PRN

## 2017-05-13 MED ORDER — SODIUM CHLORIDE 0.9 % WEIGHT BASED INFUSION: 1.0000 mL/kg/h | INTRAVENOUS | Status: DC

## 2017-05-13 MED ORDER — MIDAZOLAM HCL 2 MG/2ML IJ SOLN
INTRAMUSCULAR | Status: DC | PRN
  Administered 2017-05-13 (×2): 1 mg via INTRAVENOUS

## 2017-05-13 MED ORDER — SODIUM CHLORIDE 0.9% FLUSH: 3.0000 mL | INTRAVENOUS | Status: DC | PRN

## 2017-05-13 MED ORDER — HEPARIN SODIUM (PORCINE) 1000 UNIT/ML IJ SOLN
INTRAMUSCULAR | Status: AC
  Filled 2017-05-13: qty 1

## 2017-05-13 MED ORDER — ATORVASTATIN CALCIUM 80 MG PO TABS
80.0000 mg | ORAL_TABLET | Freq: Every day | ORAL | Status: DC
  Administered 2017-05-13 – 2017-05-23 (×10): 80 mg via ORAL
  Filled 2017-05-13 (×10): qty 1

## 2017-05-13 MED ORDER — HEPARIN (PORCINE) IN NACL 2-0.9 UNIT/ML-% IJ SOLN
INTRAMUSCULAR | Status: AC
  Filled 2017-05-13: qty 1000

## 2017-05-13 MED ORDER — HEPARIN (PORCINE) IN NACL 100-0.45 UNIT/ML-% IJ SOLN
1250.0000 [IU]/h | INTRAMUSCULAR | Status: DC
  Administered 2017-05-14 – 2017-05-18 (×5): 1250 [IU]/h via INTRAVENOUS
  Filled 2017-05-13 (×7): qty 250

## 2017-05-13 MED ORDER — NITROGLYCERIN 1 MG/10 ML FOR IR/CATH LAB
INTRA_ARTERIAL | Status: AC
  Filled 2017-05-13: qty 10

## 2017-05-13 MED ORDER — OXYCODONE HCL 5 MG PO TABS: 5.0000 mg | ORAL_TABLET | ORAL | Status: DC | PRN

## 2017-05-13 MED ORDER — VERAPAMIL HCL 2.5 MG/ML IV SOLN
INTRAVENOUS | Status: DC | PRN
  Administered 2017-05-13: 10 mL via INTRA_ARTERIAL

## 2017-05-13 MED ORDER — FENTANYL CITRATE (PF) 100 MCG/2ML IJ SOLN
INTRAMUSCULAR | Status: AC
  Filled 2017-05-13: qty 2

## 2017-05-13 MED ORDER — LIDOCAINE HCL 2 % IJ SOLN
INTRAMUSCULAR | Status: AC
  Filled 2017-05-13: qty 10

## 2017-05-13 MED ORDER — ASPIRIN 81 MG PO CHEW: 81.0000 mg | CHEWABLE_TABLET | Freq: Every day | ORAL | Status: DC

## 2017-05-13 MED ORDER — SODIUM CHLORIDE 0.9% FLUSH
3.0000 mL | Freq: Two times a day (BID) | INTRAVENOUS | Status: DC
  Administered 2017-05-13: 3 mL via INTRAVENOUS

## 2017-05-13 MED ORDER — ASPIRIN 81 MG PO CHEW
81.0000 mg | CHEWABLE_TABLET | ORAL | Status: AC
  Administered 2017-05-13: 81 mg via ORAL

## 2017-05-13 MED ORDER — IOPAMIDOL (ISOVUE-370) INJECTION 76%
INTRAVENOUS | Status: DC | PRN
  Administered 2017-05-13: 100 mL via INTRAVENOUS

## 2017-05-13 MED ORDER — HEPARIN (PORCINE) IN NACL 2-0.9 UNIT/ML-% IJ SOLN
INTRAMUSCULAR | Status: AC | PRN
  Administered 2017-05-13: 1000 mL

## 2017-05-13 MED ORDER — HEPARIN (PORCINE) IN NACL 100-0.45 UNIT/ML-% IJ SOLN
1000.0000 [IU]/h | INTRAMUSCULAR | Status: DC
  Administered 2017-05-13: 1000 [IU]/h via INTRAVENOUS
  Filled 2017-05-13: qty 250

## 2017-05-13 MED ORDER — SODIUM CHLORIDE 0.9% FLUSH: 3.0000 mL | Freq: Two times a day (BID) | INTRAVENOUS | Status: DC

## 2017-05-13 MED ORDER — HEPARIN BOLUS VIA INFUSION
4000.0000 [IU] | Freq: Once | INTRAVENOUS | Status: AC
Start: 2017-05-13 — End: 2017-05-13
  Administered 2017-05-13: 4000 [IU] via INTRAVENOUS
  Filled 2017-05-13: qty 4000

## 2017-05-13 MED ORDER — FENTANYL CITRATE (PF) 100 MCG/2ML IJ SOLN
INTRAMUSCULAR | Status: DC | PRN
  Administered 2017-05-13 (×2): 50 ug via INTRAVENOUS

## 2017-05-13 MED ORDER — ASPIRIN 81 MG PO CHEW
81.0000 mg | CHEWABLE_TABLET | Freq: Every day | ORAL | Status: DC
  Administered 2017-05-14 – 2017-05-18 (×5): 81 mg via ORAL
  Filled 2017-05-13 (×6): qty 1

## 2017-05-13 MED ORDER — MIDAZOLAM HCL 2 MG/2ML IJ SOLN
INTRAMUSCULAR | Status: AC
  Filled 2017-05-13: qty 2

## 2017-05-13 MED ORDER — SODIUM CHLORIDE 0.9 % WEIGHT BASED INFUSION
3.0000 mL/kg/h | INTRAVENOUS | Status: DC
  Administered 2017-05-13: 3 mL/kg/h via INTRAVENOUS

## 2017-05-13 MED ORDER — VERAPAMIL HCL 2.5 MG/ML IV SOLN
INTRAVENOUS | Status: AC
  Filled 2017-05-13: qty 2

## 2017-05-13 MED ORDER — SODIUM CHLORIDE 0.9 % WEIGHT BASED INFUSION
1.0000 mL/kg/h | INTRAVENOUS | Status: AC
  Administered 2017-05-13: 1 mL/kg/h via INTRAVENOUS

## 2017-05-13 MED ORDER — LIDOCAINE HCL 2 % IJ SOLN
INTRAMUSCULAR | Status: DC | PRN
  Administered 2017-05-13: 2 mL

## 2017-05-13 SURGICAL SUPPLY — 12 items
CATH INFINITI 5 FR JL3.5 (CATHETERS) ×3 IMPLANT
CATH INFINITI JR4 5F (CATHETERS) ×3 IMPLANT
COVER PRB 48X5XTLSCP FOLD TPE (BAG) ×2 IMPLANT
COVER PROBE 5X48 (BAG) ×1
DEVICE RAD COMP TR BAND LRG (VASCULAR PRODUCTS) ×3 IMPLANT
GLIDESHEATH SLEND A-KIT 6F 22G (SHEATH) ×3 IMPLANT
GUIDEWIRE INQWIRE 1.5J.035X260 (WIRE) ×2 IMPLANT
INQWIRE 1.5J .035X260CM (WIRE) ×3
KIT HEART LEFT (KITS) ×3 IMPLANT
PACK CARDIAC CATHETERIZATION (CUSTOM PROCEDURE TRAY) ×3 IMPLANT
TRANSDUCER W/STOPCOCK (MISCELLANEOUS) ×3 IMPLANT
TUBING CIL FLEX 10 FLL-RA (TUBING) ×3 IMPLANT

## 2017-05-13 NOTE — Progress Notes (Signed)
ANTICOAGULATION CONSULT NOTE - Follow up Pharmacy Consult for Heparin Indication: chest pain/ACS/NSTEMI  No Known Allergies Patient Measurements: Height: 5\' 8"  (172.7 cm) Weight: 180 lb 3.2 oz (81.7 kg) IBW/kg (Calculated) : 68.4 Heparin Dosing Weight: 81.7 kg   Vital Signs: Temp: 97.5 F (36.4 C) (10/16 0500) Temp Source: Oral (10/16 0500) BP: 126/79 (10/16 1410) Pulse Rate: 55 (10/16 1410)  Labs:  Recent Labs  05/12/17 1127  05/13/17 0044 05/13/17 0353 05/13/17 0709 05/13/17 1034  HGB 14.7  --   --   --   --   --   HCT 44.0  --   --   --   --   --   PLT 308  --   --   --   --   --   LABPROT  --   --   --   --   --  14.8  INR  --   --   --   --   --  1.17  CREATININE 1.09  --   --   --   --   --   TROPONINI <0.03  < > 0.21* 0.16* 0.11*  --   < > = values in this interval not displayed.  Estimated Creatinine Clearance: 63.6 mL/min (by C-G formula based on SCr of 1.09 mg/dL).   Medical History: Past Medical History:  Diagnosis Date  . Medical history non-contributory     Medications:  Prescriptions Prior to Admission  Medication Sig Dispense Refill Last Dose  . [DISCONTINUED] ibuprofen (ADVIL,MOTRIN) 200 MG tablet Take 200 mg by mouth every 6 (six) hours as needed for mild pain.   Past Week at Unknown time   Scheduled:  . aspirin  81 mg Oral Daily  . atorvastatin  80 mg Oral q1800  . metoprolol tartrate  12.5 mg Oral BID  . [START ON 05/14/2017] sodium chloride flush  3 mL Intravenous Q12H    Assessment: 66 y.o male to start on IV heparin infusion for chest pain/NSTEMI by troponin. CBC wnl.  No history of CAD. No medications PTA except prn ibuprofen. Heparin bolus & drip started this morning prior to going to cath lab.   Possible thrombus in mid RCA, high grade calcified stenosis in the mid LAD and diagonal.  Cardilogist recommending PCI. Pharmacy consulted to restart IV Heparin drip 8 hours after sheath removal. Sheath removed at 14:09 per cath report. Has  TR band on.  No bleeding or hematoma noted.   Now RN reports when deflated TR band, had some bleeding.  RN injected air in TR band.   Goal of Therapy:  Heparin level 0.3-0.7 units/ml Monitor platelets by anticoagulation protocol: Yes   Plan:  Aproximately 22:00 tonight (8 hours after sheath removed or after TR band removed), restart  IV Heparin drip 1000 units/hr 6 hour heparin level Daily heparin level and CBC while on IV heparin drip..   Thank you for allowing pharmacy to be part of this patients care team.  Nicole Cella, [place] Clinical Pharmacist Pager: [phone] 8a-330p [phone] 330p-1030p phone [phone] or [phone] Main pharmacy [phone] 05/13/2017,3:24 PM

## 2017-05-13 NOTE — Progress Notes (Signed)
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 which he prefers after I described CABG procedure and recovery Objective: Vital signs in last 24 hours: Temp:  [97.5 F (36.4 C)-98.2 F (36.8 C)] 97.5 F (36.4 C) (10/16 0500) Pulse Rate:  [55-73] 55 (10/16 1410) Cardiac Rhythm: Normal sinus rhythm (10/16 0900) Resp:  [12-18] 14 (10/16 1410) BP: (114-161)/(68-94) 126/79 (10/16 1410) SpO2:  [93 %-99 %] 97 % (10/16 1410) Weight:  [180 lb 3.2 oz (81.7 kg)-180 lb 8 oz (81.9 kg)] 180 lb 3.2 oz (81.7 kg) (10/16 0500)  Hemodynamic parameters for last 24 hours:    Intake/Output from previous day: 10/15 0701 - 10/16 0700 In: 600 [P.O.:600] Out: -  Intake/Output this shift: No intake/output data recorded.       Exam    General- alert and comfortable   Lungs- clear without rales, wheezes   Cor- regular rate and rhythm, no murmur , gallop   Abdomen- soft, non-tender   Extremities - warm, non-tender, minimal edema   Neuro- oriented, appropriate, no focal weakness   Lab Results:  Recent Labs  05/12/17 1127  WBC 6.6  HGB 14.7  HCT 44.0  PLT 308   BMET:  Recent Labs  05/12/17 1127  NA 140  K 3.8  CL 105  CO2 25  GLUCOSE 99  BUN 12  CREATININE 1.09  CALCIUM 9.1    PT/INR:  Recent Labs  05/13/17 1034  LABPROT 14.8  INR 1.17   ABG No results found for: PHART, HCO3, TCO2, ACIDBASEDEF, O2SAT CBG (last 3)  No results for input(s): GLUCAP in the last 72 hours.  Assessment/Plan: S/P  Procedure(s) (LRB): LEFT HEART CATH AND CORONARY ANGIOGRAPHY (N/A) Ultrasound Guidance For Vascular Access nonstemi 3v CAD Will follow pending patients decision PCI vs CABG   LOS: 0 days    Tharon Aquas Trigt III 05/13/2017

## 2017-05-13 NOTE — Interval H&P Note (Signed)
Cath Lab Visit (complete for each Cath Lab visit)  Clinical Evaluation Leading to the Procedure:   ACS: Yes.    Non-ACS:    Anginal Classification: CCS III  Anti-ischemic medical therapy: Minimal Therapy (1 class of medications)  Non-Invasive Test Results: No non-invasive testing performed  Prior CABG: No previous CABG      History and Physical Interval Note:  05/13/2017 12:56 PM  Jason Lewis  has presented today for surgery, with the diagnosis of cp  The various methods of treatment have been discussed with the patient and family. After consideration of risks, benefits and other options for treatment, the patient has consented to  Procedure(s): LEFT HEART CATH AND CORONARY ANGIOGRAPHY (N/A) as a surgical intervention .  [name] history has been reviewed, patient examined, no change in status, stable for surgery.  I have reviewed the patient's chart and labs.  Questions were answered to the patient's satisfaction.     Belva Crome III

## 2017-05-13 NOTE — Care Management Obs Status (Signed)
[place] NOTIFICATION   Patient Details  Name: Jason Lewis MRN: [id number] Date of Birth: Jan 23, 1951   Medicare Observation Status Notification Given:  Yes    Carles Collet, RN 05/13/2017, 10:40 AM

## 2017-05-13 NOTE — Research (Signed)
OPTIMIZE Informed Consent   Subject Name: Jason Lewis  Subject met inclusion and exclusion criteria.  The informed consent form, study requirements and expectations were reviewed with the subject and questions and concerns were addressed prior to the signing of the consent form.  The subject verbalized understanding of the trail requirements.  The subject agreed to participate in the OPTIMIZE trial and signed the informed consent.  The informed consent was obtained prior to performance of any protocol-specific procedures for the subject.  A copy of the signed informed consent was given to the subject and a copy was placed in the subject's medical record. Only applicable if randomized.   Philemon Kingdom D 05/13/2017, 1155 AM

## 2017-05-13 NOTE — Progress Notes (Signed)
Progress Note  Patient Name: Jason Lewis Date of Encounter: 05/13/2017  Primary Cardiologist: New  Subjective   No further chest pain, no SOB  Inpatient Medications    Scheduled Meds: . enoxaparin (LOVENOX) injection  40 mg Subcutaneous Q24H   Continuous Infusions:  PRN Meds: acetaminophen, ondansetron (ZOFRAN) IV   Vital Signs    Vitals:   05/12/17 1938 05/12/17 2045 05/12/17 2220 05/13/17 0500  BP: (!) 146/83 (!) 155/93 (!) 146/94 132/69  Pulse: 64 62 65   Resp: 14 13 16 16   Temp: 98.2 F (36.8 C)  97.9 F (36.6 C) (!) 97.5 F (36.4 C)  TempSrc: Oral  Oral Oral  SpO2: 96% 97% 99% 95%  Weight:   180 lb 8 oz (81.9 kg) 180 lb 3.2 oz (81.7 kg)  Height:   5\' 8"  (1.727 m)     Intake/Output Summary (Last 24 hours) at 05/13/17 0755 Last data filed at 05/12/17 2258  Gross per 24 hour  Intake              600 ml  Output                0 ml  Net              600 ml   Filed Weights   05/12/17 1120 05/12/17 2220 05/13/17 0500  Weight: 170 lb (77.1 kg) 180 lb 8 oz (81.9 kg) 180 lb 3.2 oz (81.7 kg)    Telemetry    SR with rare PVCs - Personally Reviewed  ECG    No new, have ordered - Personally Reviewed  Physical Exam   GEN: No acute distress.   Neck: No JVD Cardiac: RRR, no murmurs, rubs, or gallops.  Respiratory: Clear to auscultation bilaterally. GI: Soft, nontender, non-distended  MS: No edema; Rt shin with abrasion from pressure washer  Neuro:  Nonfocal  Psych: Normal affect   Labs    Chemistry Recent Labs Lab 05/12/17 1127  NA 140  K 3.8  CL 105  CO2 25  GLUCOSE 99  BUN 12  CREATININE 1.09  CALCIUM 9.1  GFRNONAA >60  GFRAA >60  ANIONGAP 10     Hematology Recent Labs Lab 05/12/17 1127  WBC 6.6  RBC 4.87  HGB 14.7  HCT 44.0  MCV 90.3  MCH 30.2  MCHC 33.4  RDW 12.4  PLT 308    Cardiac Enzymes Recent Labs Lab 05/12/17 1546 05/12/17 1832 05/13/17 0044 05/13/17 0353  TROPONINI 0.03* 0.07* 0.21* 0.16*   No results  for input(s): TROPIPOC in the last 168 hours.   BNPNo results for input(s): BNP, PROBNP in the last 168 hours.   DDimer No results for input(s): DDIMER in the last 168 hours.   Radiology    Dg Chest 2 View  Result Date: 05/12/2017 CLINICAL DATA:  Patient had an episode of chest pain radiating to the left shoulder 3 days ago with recurrent symptoms this morning. Persistent left shoulder achiness. No cardiac history. Never smoked. EXAM: CHEST  2 VIEW COMPARISON:  None in PACs FINDINGS: The lungs are adequately inflated and clear. The heart and pulmonary vascularity are normal. The mediastinum is normal in width. The trachea is midline. There is calcification in the wall of the aortic arch. The bony thorax is unremarkable. IMPRESSION: There is no acute cardiopulmonary abnormality. Thoracic aortic atherosclerosis. Electronically Signed   By: David  Martinique M.D.   On: 05/12/2017 11:50    Cardiac Studies   Cath pending  Patient Profile     66 y.o. male with no significant past medical history now with episodic chest pain, weakness and lt arm aching.  troponins elevated.  Assessment & Plan    [place] angina/NSTEMI -troponin pk 0.21 recheck EKG and plan for cath today. Add ASA 81 daily -he did have 325 last pm.  Echo is pending.  Will also add IV heparin  HTN on arrival  155/93 now 132/69 will add BB for now. No previous dx of HTN  Adding low dose BB  HLD LDL is 141 will add lipitor  For questions or updates, please contact [place] Please consult [email or web address] for contact info under Cardiology/STEMI.      Signed, Cecilie Kicks, NP  05/13/2017, 7:55 AM

## 2017-05-13 NOTE — Progress Notes (Signed)
ANTICOAGULATION CONSULT NOTE - Initial Consult  Pharmacy Consult for Heparin Indication: chest pain/ACS/NSTEMI  No Known Allergies Patient Measurements: Height: 5\' 8"  (172.7 cm) Weight: 180 lb 3.2 oz (81.7 kg) IBW/kg (Calculated) : 68.4 Heparin Dosing Weight: 81.7 kg   Vital Signs: Temp: 97.5 F (36.4 C) (10/16 0500) Temp Source: Oral (10/16 0500) BP: 161/81 (10/16 0902) Pulse Rate: 73 (10/16 0902)  Labs:  Recent Labs  05/12/17 1127  05/13/17 0044 05/13/17 0353 05/13/17 0709  HGB 14.7  --   --   --   --   HCT 44.0  --   --   --   --   PLT 308  --   --   --   --   CREATININE 1.09  --   --   --   --   TROPONINI <0.03  < > 0.21* 0.16* 0.11*  < > = values in this interval not displayed.  Estimated Creatinine Clearance: 63.6 mL/min (by C-G formula based on SCr of 1.09 mg/dL).   Medical History: Past Medical History:  Diagnosis Date  . Medical history non-contributory     Medications:  Prescriptions Prior to Admission  Medication Sig Dispense Refill Last Dose  . ibuprofen (ADVIL,MOTRIN) 200 MG tablet Take 200 mg by mouth every 6 (six) hours as needed for mild pain.   Past Week at Unknown time   Scheduled:  . aspirin  81 mg Oral Daily  . atorvastatin  80 mg Oral q1800  . metoprolol tartrate  12.5 mg Oral BID  . sodium chloride flush  3 mL Intravenous Q12H    Assessment: 66 y.o male to start on IV heparin infusion for chest pain/NSTEMI by troponin. CBC wnl.  No history of CAD. No medications PTA except prn ibuprofen.   Goal of Therapy:  Heparin level 0.3-0.7 units/ml Monitor platelets by anticoagulation protocol: Yes   Plan:  Heparin 4000 units IV bolus x1  Heparin drip 1000 units/hr 6 hour heparin level F/u after cath today.   Thank you for allowing pharmacy to be part of this patients care team.  Nicole Cella, [place] Clinical Pharmacist Pager: [phone] 8a-330p [phone] 330p-1030p phone [phone] or [phone] Main pharmacy [phone] 05/13/2017,9:43 AM

## 2017-05-13 NOTE — Care Management Note (Signed)
Case Management Note  Patient Details  Name: Jason Lewis MRN: [id number] Date of Birth: [birth year]  Subjective/Objective:                 Spoke with patient and wife at the bedside. Patient is on obs for CP. PTA describes himself as independent. Drives to appointments and pharmacy, denies barriers to obtaining medications, uses no DME.    Action/Plan:  CM will continue to follow for DC planning needs.  Expected Discharge Date:                  Expected Discharge Plan:  Home/Self Care  In-House Referral:     Discharge planning Services  CM Consult  Post Acute Care Choice:    Choice offered to:     DME Arranged:    DME Agency:     HH Arranged:    HH Agency:     Status of Service:  In process, will continue to follow  If discussed at Long Length of Stay Meetings, dates discussed:    Additional Comments:  Jason Collet, RN 05/13/2017, 10:40 AM

## 2017-05-13 NOTE — Progress Notes (Signed)
PROGRESS NOTE    Jason Lewis  VPX:[id number] DOB: 06-01-1951 DOA: 05/12/2017 PCP: Deland Pretty, MD      Brief Narrative:  66 yr old gentleman with no significant past medical history who presented to the [facility] episodic chest pain. Symptoms began about a week ago while working. 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 He has been having episodic "weak spells" accompanied by retrosternal chest tightness and left arm aching. There is associated shortness of breath. Each episode may last 15 minutes and then he may not experience any symptoms for 2 days. This morning while working, he had another episod, longer than before, more intense, he sat in the car and it slowly resolved, so he came to the ER.   Assessment & Plan:  Principal Problem:   Chest pain Active Problems:   Coronary artery disease   Non-ST elevation (NSTEMI) myocardial infarction (HCC)   NSTEMI -Heparin has been started by Cardiology -Aspirin 324 given -To cath today -Echo pending   Hypertension No prior history -Defer BB, ACEi choice to Cardiology  Hyperlipidemia -Atorvastatin has been started by Cardiology      DVT prophylaxis: Lovenox Code Status: FULL Family Communication: Wife at bedside Disposition Plan: Cath today.  Pending results, likely home tomorrow with Cardiac rehab and Cards f/u.   Consultants:   Cardiology  Procedures:   LHC  Echo      Subjective: Patient feeling well today.  No further chest pain.  Anticipating cath today.  No palpitaitons or dyspnea.  Objective: Vitals:   05/12/17 2220 05/13/17 0500 05/13/17 0902 05/13/17 1323  BP: (!) 146/94 132/69 (!) 161/81   Pulse: 65  73   Resp: 16 16    Temp: 97.9 F (36.6 C) (!) 97.5 F (36.4 C)    TempSrc: Oral Oral    SpO2: 99% 95%  99%  Weight: 81.9 kg (180 lb 8 oz) 81.7 kg (180 lb 3.2 oz)    Height: 5\' 8"  (1.727 m)       Intake/Output Summary (Last 24 hours) at 05/13/17 1412 Last data filed at 05/13/17  1100  Gross per 24 hour  Intake              600 ml  Output                0 ml  Net              600 ml   Filed Weights   05/12/17 1120 05/12/17 2220 05/13/17 0500  Weight: 77.1 kg (170 lb) 81.9 kg (180 lb 8 oz) 81.7 kg (180 lb 3.2 oz)    Examination: General appearance: Overweight adult male, alert and in noacute distress.   HEENT: Anicteric, conjunctiva pink, lids and lashes normal. No nasal deformity, discharge, epistaxis.  Lips moist.   Skin: Warm and dry.  no jaundice.  No suspicious rashes or lesions. Cardiac: RRR, nl S1-S2, no murmurs appreciated.  Capillary refill is brisk.  JVP normal.  No LE edema.  Radial pulses 2+ and symmetric. Respiratory: Normal respiratory rate and rhythm.  CTAB without rales or wheezes. Abdomen: Abdomen soft.  No TTP. No ascites, distension, hepatosplenomegaly.   MSK: No deformities or effusions. Neuro: Awake and alert.  EOMI, moves all extremities. Speech fluent.    Psych: Sensorium intact and responding to questions, attention normal. Affect normal.  Judgment and insight appear normal.    Data Reviewed: I have personally reviewed following labs and imaging studies:  CBC:  Recent  Labs Lab 05/12/17 1127  WBC 6.6  HGB 14.7  HCT 44.0  MCV 90.3  PLT 242   Basic Metabolic Panel:  Recent Labs Lab 05/12/17 1127  NA 140  K 3.8  CL 105  CO2 25  GLUCOSE 99  BUN 12  CREATININE 1.09  CALCIUM 9.1   GFR: Estimated Creatinine Clearance: 63.6 mL/min (by C-G formula based on SCr of 1.09 mg/dL). Liver Function Tests: No results for input(s): AST, ALT, ALKPHOS, BILITOT, PROT, ALBUMIN in the last 168 hours. No results for input(s): LIPASE, AMYLASE in the last 168 hours. No results for input(s): AMMONIA in the last 168 hours. Coagulation Profile:  Recent Labs Lab 05/13/17 1034  INR 1.17   Cardiac Enzymes:  Recent Labs Lab 05/12/17 1546 05/12/17 1832 05/13/17 0044 05/13/17 0353 05/13/17 0709  TROPONINI 0.03* 0.07* 0.21* 0.16*  0.11*   BNP (last 3 results) No results for input(s): PROBNP in the last 8760 hours. HbA1C: No results for input(s): HGBA1C in the last 72 hours. CBG: No results for input(s): GLUCAP in the last 168 hours. Lipid Profile:  Recent Labs  05/12/17 1832  CHOL 210*  HDL 44  LDLCALC 141*  TRIG 124  CHOLHDL 4.8   Thyroid Function Tests: No results for input(s): TSH, T4TOTAL, FREET4, T3FREE, THYROIDAB in the last 72 hours. Anemia Panel: No results for input(s): VITAMINB12, FOLATE, FERRITIN, TIBC, IRON, RETICCTPCT in the last 72 hours. Urine analysis: No results found for: COLORURINE, APPEARANCEUR, LABSPEC, PHURINE, GLUCOSEU, HGBUR, BILIRUBINUR, KETONESUR, PROTEINUR, UROBILINOGEN, NITRITE, LEUKOCYTESUR Sepsis Labs: @LABRCNTIP (procalcitonin:4,lacticidven:4)  )No results found for this or any previous visit (from the past 240 hour(s)).       Radiology Studies: Dg Chest 2 View  Result Date: 05/12/2017 CLINICAL DATA:  Patient had an episode of chest pain radiating to the left shoulder 3 days ago with recurrent symptoms this morning. Persistent left shoulder achiness. No cardiac history. Never smoked. EXAM: CHEST  2 VIEW COMPARISON:  None in PACs FINDINGS: The lungs are adequately inflated and clear. The heart and pulmonary vascularity are normal. The mediastinum is normal in width. The trachea is midline. There is calcification in the wall of the aortic arch. The bony thorax is unremarkable. IMPRESSION: There is no acute cardiopulmonary abnormality. Thoracic aortic atherosclerosis. Electronically Signed   By: David  Martinique M.D.   On: 05/12/2017 11:50        Scheduled Meds: . [MAR Hold] aspirin  81 mg Oral Daily  . [MAR Hold] atorvastatin  80 mg Oral q1800  . [MAR Hold] metoprolol tartrate  12.5 mg Oral BID  . sodium chloride flush  3 mL Intravenous Q12H   Continuous Infusions: . sodium chloride    . sodium chloride 1 mL/kg/hr (05/13/17 1000)  . heparin 1,000 Units/hr (05/13/17  1036)  . heparin       LOS: 0 days    Time spent: 20 minutes    Edwin Dada, MD [facility] Pager [phone]  If 7PM-7AM, please contact night-coverage [email or web address] Password [email or web address] 05/13/2017, 2:12 PM

## 2017-05-13 NOTE — H&P (View-Only) (Signed)
Progress Note  Patient Name: Jason Lewis Date of Encounter: 05/13/2017  Primary Cardiologist: New  Subjective   No further chest pain, no SOB  Inpatient Medications    Scheduled Meds: . enoxaparin (LOVENOX) injection  40 mg Subcutaneous Q24H   Continuous Infusions:  PRN Meds: acetaminophen, ondansetron (ZOFRAN) IV   Vital Signs    Vitals:   05/12/17 1938 05/12/17 2045 05/12/17 2220 05/13/17 0500  BP: (!) 146/83 (!) 155/93 (!) 146/94 132/69  Pulse: 64 62 65   Resp: 14 13 16 16   Temp: 98.2 F (36.8 C)  97.9 F (36.6 C) (!) 97.5 F (36.4 C)  TempSrc: Oral  Oral Oral  SpO2: 96% 97% 99% 95%  Weight:   180 lb 8 oz (81.9 kg) 180 lb 3.2 oz (81.7 kg)  Height:   5\' 8"  (1.727 m)     Intake/Output Summary (Last 24 hours) at 05/13/17 0755 Last data filed at 05/12/17 2258  Gross per 24 hour  Intake              600 ml  Output                0 ml  Net              600 ml   Filed Weights   05/12/17 1120 05/12/17 2220 05/13/17 0500  Weight: 170 lb (77.1 kg) 180 lb 8 oz (81.9 kg) 180 lb 3.2 oz (81.7 kg)    Telemetry    SR with rare PVCs - Personally Reviewed  ECG    No new, have ordered - Personally Reviewed  Physical Exam   GEN: No acute distress.   Neck: No JVD Cardiac: RRR, no murmurs, rubs, or gallops.  Respiratory: Clear to auscultation bilaterally. GI: Soft, nontender, non-distended  MS: No edema; Rt shin with abrasion from pressure washer  Neuro:  Nonfocal  Psych: Normal affect   Labs    Chemistry Recent Labs Lab 05/12/17 1127  NA 140  K 3.8  CL 105  CO2 25  GLUCOSE 99  BUN 12  CREATININE 1.09  CALCIUM 9.1  GFRNONAA >60  GFRAA >60  ANIONGAP 10     Hematology Recent Labs Lab 05/12/17 1127  WBC 6.6  RBC 4.87  HGB 14.7  HCT 44.0  MCV 90.3  MCH 30.2  MCHC 33.4  RDW 12.4  PLT 308    Cardiac Enzymes Recent Labs Lab 05/12/17 1546 05/12/17 1832 05/13/17 0044 05/13/17 0353  TROPONINI 0.03* 0.07* 0.21* 0.16*   No results  for input(s): TROPIPOC in the last 168 hours.   BNPNo results for input(s): BNP, PROBNP in the last 168 hours.   DDimer No results for input(s): DDIMER in the last 168 hours.   Radiology    Dg Chest 2 View  Result Date: 05/12/2017 CLINICAL DATA:  Patient had an episode of chest pain radiating to the left shoulder 3 days ago with recurrent symptoms this morning. Persistent left shoulder achiness. No cardiac history. Never smoked. EXAM: CHEST  2 VIEW COMPARISON:  None in PACs FINDINGS: The lungs are adequately inflated and clear. The heart and pulmonary vascularity are normal. The mediastinum is normal in width. The trachea is midline. There is calcification in the wall of the aortic arch. The bony thorax is unremarkable. IMPRESSION: There is no acute cardiopulmonary abnormality. Thoracic aortic atherosclerosis. Electronically Signed   By: David  Martinique M.D.   On: 05/12/2017 11:50    Cardiac Studies   Cath pending  Patient Profile     66 y.o. male with no significant past medical history now with episodic chest pain, weakness and lt arm aching.  troponins elevated.  Assessment & Plan    Crescendo angina/NSTEMI -troponin pk 0.21 recheck EKG and plan for cath today. Add ASA 81 daily -he did have 325 last pm.  Echo is pending.  Will also add IV heparin  HTN on arrival  155/93 now 132/69 will add BB for now. No previous dx of HTN  Adding low dose BB  HLD LDL is 141 will add lipitor  For questions or updates, please contact [place] Please consult [email or web address] for contact info under Cardiology/STEMI.      Signed, Cecilie Kicks, NP  05/13/2017, 7:55 AM

## 2017-05-13 NOTE — Progress Notes (Signed)
  Echocardiogram 2D Echocardiogram has been performed.  Jason Lewis 05/13/2017, 1:08 PM

## 2017-05-14 ENCOUNTER — Encounter (HOSPITAL_COMMUNITY): Payer: Self-pay | Admitting: Interventional Cardiology

## 2017-05-14 DIAGNOSIS — I2510 Atherosclerotic heart disease of native coronary artery without angina pectoris: Secondary | ICD-10-CM

## 2017-05-14 DIAGNOSIS — I2583 Coronary atherosclerosis due to lipid rich plaque: Secondary | ICD-10-CM

## 2017-05-14 LAB — HEPARIN LEVEL (UNFRACTIONATED)
HEPARIN UNFRACTIONATED: 0.35 [IU]/mL (ref 0.30–0.70)
Heparin Unfractionated: 0.11 IU/mL — ABNORMAL LOW (ref 0.30–0.70)

## 2017-05-14 LAB — CBC
HCT: 39.9 % (ref 39.0–52.0)
Hemoglobin: 13.2 g/dL (ref 13.0–17.0)
MCH: 30.1 pg (ref 26.0–34.0)
MCHC: 33.1 g/dL (ref 30.0–36.0)
MCV: 91.1 fL (ref 78.0–100.0)
PLATELETS: 281 10*3/uL (ref 150–400)
RBC: 4.38 MIL/uL (ref 4.22–5.81)
RDW: 12.4 % (ref 11.5–15.5)
WBC: 6.6 10*3/uL (ref 4.0–10.5)

## 2017-05-14 NOTE — Progress Notes (Signed)
ANTICOAGULATION CONSULT NOTE - Follow up Pharmacy Consult for Heparin Indication: chest pain/ACS/NSTEMI  No Known Allergies Patient Measurements: Height: 5\' 8"  (172.7 cm) Weight: 181 lb 1.6 oz (82.1 kg) IBW/kg (Calculated) : 68.4 Heparin Dosing Weight: 81.7 kg   Vital Signs: Temp: 97.9 F (36.6 C) (10/17 0346) Temp Source: Oral (10/17 0346) BP: 121/71 (10/17 0346) Pulse Rate: 54 (10/17 0346)  Labs:  Recent Labs  05/12/17 1127  05/13/17 0044 05/13/17 0353 05/13/17 0709 05/13/17 1034 05/14/17 0438  HGB 14.7  --   --   --   --   --  13.2  HCT 44.0  --   --   --   --   --  39.9  PLT 308  --   --   --   --   --  281  LABPROT  --   --   --   --   --  14.8  --   INR  --   --   --   --   --  1.17  --   HEPARINUNFRC  --   --   --   --   --   --  0.11*  CREATININE 1.09  --   --   --   --   --   --   TROPONINI <0.03  < > 0.21* 0.16* 0.11*  --   --   < > = values in this interval not displayed.  Estimated Creatinine Clearance: 68.7 mL/min (by C-G formula based on SCr of 1.09 mg/dL).   Medical History: Past Medical History:  Diagnosis Date  . Medical history non-contributory     Medications:  Prescriptions Prior to Admission  Medication Sig Dispense Refill Last Dose  . [DISCONTINUED] ibuprofen (ADVIL,MOTRIN) 200 MG tablet Take 200 mg by mouth every 6 (six) hours as needed for mild pain.   Past Week at Unknown time   Scheduled:  . aspirin  81 mg Oral Daily  . atorvastatin  80 mg Oral q1800  . metoprolol tartrate  12.5 mg Oral BID  . sodium chloride flush  3 mL Intravenous Q12H   Assessment: 66 y.o male on IV heparin infusion for chest pain/NSTEMI. Now s/p left heart cath and CVTS consulted for evaluation of 3v CAD and RCA thrombus. IV heparin resumed per pharmacy 8 hrs post sheath removal.   Heparin level subtherapeutic at 0.11 and no infusion issues per RN. CBC stable and no bleeding documented.   Will hold bolus in setting of recent cath.   Goal of Therapy:   Heparin level 0.3-0.7 units/ml Monitor platelets by anticoagulation protocol: Yes   Plan:  Increase heparin gtt to 1250 units/hr Heparin level in 6 hrs Daily heparin level and CBC Monitor for s/s bleeding F/u cards plan  Lavonda Jumbo, PharmD Clinical Pharmacist 05/14/17 6:22 AM

## 2017-05-14 NOTE — Consult Note (Signed)
[facility] CM Primary Care Navigator  05/14/2017  Jason Lewis [birth year] [id number]   Met with patient and wife Ulis Rias) at the bedside to identify possible discharge needs.  Patient reports having "chest pain and fatigue" that had led to this admission and possible surgery (Coronary Artery Bypass Graft).  Patient endorses Dr. Deland Pretty with [facility] as theprimary care provider.   [facility]. Patient states that he is managing his ownmedications at home straight out of the containers.  According to patient, hewasdriving prior to admission but his wife willbe able to providetransportation to his doctors' appointments after discharge when needed.  Wife reports that she is theprimary caregiver for patient at home.  Anticipated discharge plan is home according to wife. Per therapy, patient is currently independent with all mobility.  Patientand wife expressed understanding to callprimary care provider's officewhen he returnshome for a post discharge follow-up appointment within a week or sooner if needed. Patient letter (with PCP's contact number) was provided as a reminder.  Explained to patient and wife about [facility] CM services available for health management at home but both deniedany needs or concerns at this time.  Patient had verbally agreed and opted for EMMI calls to follow-up his recovery at home. Referral to [place] calls made after discharge.  Patientand wife voiced understanding to seek referral from primary care provider to [facility] care management services if necessary and appropriate in the future.  [facility] care management information provided for future needs that he may have.   For questions, please contact:  Dannielle Huh, BSN, RN- [facility] Primary Care Navigator  Telephone: [phone] [place]

## 2017-05-14 NOTE — Evaluation (Signed)
Physical Therapy Evaluation Patient Details Name: Jason Lewis MRN: [id number] DOB: [birth year] Today's Date: 05/14/2017   History of Present Illness  Pt adm with NSTEMI and in need of CABG. Pt with no past medical history.  Clinical Impression  Pt seen for assessment prior to possible CABG. Pt currently independent with all mobility. Spent session instructing pt in mobility without use of UE's in preparation for sternal precautions. Instructed pt in bed mobility, transfers, amb and stairs. Instructed pt to perform sit to stand reps without use of hands x 5 reps several times a day. Also recommend pt amb in halls on his own frequently.     Follow Up Recommendations No PT follow up    Equipment Recommendations  None recommended by PT    Recommendations for Other Services       Precautions / Restrictions Precautions Precautions: None      Mobility  Bed Mobility Overal bed mobility: Independent             General bed mobility comments: Instructed pt in rolling to side and going sidelying to sit to sidelying with minimal use of UE's in preparation for CABG  Transfers Overall transfer level: Independent Equipment used: None             General transfer comment: Instructed in and had pt practice coming to stand without use of hands.   Ambulation/Gait Ambulation/Gait assistance: Independent Ambulation Distance (Feet): 500 Feet Assistive device: None Gait Pattern/deviations: WFL(Within Functional Limits)        Stairs            Wheelchair Mobility    Modified Rankin (Stroke Patients Only)       Balance Overall balance assessment: No apparent balance deficits (not formally assessed)                                           Pertinent Vitals/Pain Pain Assessment: No/denies pain    Home Living Family/patient expects to be discharged to:: Private residence Living Arrangements: Spouse/significant other Available Help at  Discharge: Family Type of Home: House       Home Layout: Two level;Bed/bath upstairs;1/2 bath on main level Home Equipment: None      Prior Function Level of Independence: Independent         Comments: Works as a Cytogeneticist Extremity Assessment Upper Extremity Assessment: Overall WFL for tasks assessed    Lower Extremity Assessment Lower Extremity Assessment: Overall WFL for tasks assessed    Cervical / Trunk Assessment Cervical / Trunk Assessment: Normal  Communication   Communication: No difficulties  Cognition Arousal/Alertness: Awake/alert Behavior During Therapy: WFL for tasks assessed/performed Overall Cognitive Status: Within Functional Limits for tasks assessed                                        General Comments      Exercises     Assessment/Plan    PT Assessment Patent does not need any further PT services  PT Problem List         PT Treatment Interventions      PT Goals (Current goals can be found in the Care Plan section)  Acute Rehab  PT Goals PT Goal Formulation: All assessment and education complete, DC therapy    Frequency     Barriers to discharge        Co-evaluation               AM-PAC PT "6 Clicks" Daily Activity  Outcome Measure Difficulty turning over in bed (including adjusting bedclothes, sheets and blankets)?: None Difficulty moving from lying on back to sitting on the side of the bed? : None Difficulty sitting down on and standing up from a chair with arms (e.g., wheelchair, bedside commode, etc,.)?: None Help needed moving to and from a bed to chair (including a wheelchair)?: None Help needed walking in hospital room?: None Help needed climbing 3-5 steps with a railing? : None 6 Click Score: 24    End of Session   Activity Tolerance: Patient tolerated treatment well Patient left: in bed;with call bell/phone within reach;with  family/visitor present (sitting EOB) Nurse Communication: Mobility status PT Visit Diagnosis: Other abnormalities of gait and mobility (R26.89)    Time: 9233-0076 PT Time Calculation (min) (ACUTE ONLY): 33 min   Charges:   PT Evaluation $PT Eval Low Complexity: 1 Low     PT G CodesMarland Kitchen        [facility] PT [place] 05/14/2017, 2:58 PM

## 2017-05-14 NOTE — Progress Notes (Signed)
ANTICOAGULATION CONSULT NOTE - Follow Up Consult  Pharmacy Consult for Heparin Indication: NSTEMI  No Known Allergies  Patient Measurements: Height: 5\' 8"  (172.7 cm) Weight: 181 lb 1.6 oz (82.1 kg) IBW/kg (Calculated) : 68.4 Heparin Dosing Weight:    Vital Signs: Temp: 97.9 F (36.6 C) (10/17 0346) Temp Source: Oral (10/17 0346) BP: 111/65 (10/17 0935) Pulse Rate: 59 (10/17 0935)  Labs:  Recent Labs  05/12/17 1127  05/13/17 0044 05/13/17 0353 05/13/17 0709 05/13/17 1034 05/14/17 0438 05/14/17 1216  HGB 14.7  --   --   --   --   --  13.2  --   HCT 44.0  --   --   --   --   --  39.9  --   PLT 308  --   --   --   --   --  281  --   LABPROT  --   --   --   --   --  14.8  --   --   INR  --   --   --   --   --  1.17  --   --   HEPARINUNFRC  --   --   --   --   --   --  0.11* 0.35  CREATININE 1.09  --   --   --   --   --   --   --   TROPONINI <0.03  < > 0.21* 0.16* 0.11*  --   --   --   < > = values in this interval not displayed.  Estimated Creatinine Clearance: 68.7 mL/min (by C-G formula based on SCr of 1.09 mg/dL).  Assessment:  Anticoag: CP. NSTEMI by troponin. HL 0.11 - no issues with gtt per RN. No bolus in setting of recent cath 10/16. Resumed post-cath. Needs PCI to RCA, LAD. Hgb 14.7>13.2, Plt 281. Consult to CVTS, likely 72 hr heparin. HL 0.35 now back in goal range.  Goal of Therapy:  Heparin level 0.3-0.7 units/ml Monitor platelets by anticoagulation protocol: Yes   Plan:  Continue IV heparin at 1250 units/hr Daily HL and CBC  Damir Leung S. Alford Highland, PharmD, BCPS Clinical Staff Pharmacist Pager [phone]  Eilene Ghazi Stillinger 05/14/2017,1:18 PM

## 2017-05-14 NOTE — Progress Notes (Signed)
PROGRESS NOTE    Jason Lewis  QMG:[id number] DOB: 20-Dec-1950 DOA: 05/12/2017 PCP: Deland Pretty, MD      Brief Narrative:  66 yo M with no sig PMHx presents with intermittent progressive CP found to have NSTEMI.  Cath on 10/16 showed severe 3V disease and multiple branch point stenoses with some residual RCA thrombus.   Assessment & Plan:  Principal Problem:   Chest pain Active Problems:   Coronary artery disease   Non-ST elevation (NSTEMI) myocardial infarction [facility])   NSTEMI (non-ST elevated myocardial infarction) (HCC)   NSTEMI/ASCAD: -Continue aspirin, BB -Continue newly started high dose statin -Continue heparin gtt (at least 72 hrs from 10/16 AM per CVTS) -CVTS and Cardiology are both following, patient has apprehension about CABG, but after talking to Dr. Radford Pax this morning, does seem to be leaning towards CABG as more durable treatment option   DVT prophylaxis: N/A Code Status: FULL Family Communication: Wife at ebdisde Disposition Plan: Consult to CVTS, likely 72 hr heparin, and ultrasounds per CVTS and seems patient will ultimately elect CABG.   Consultants:   CVTS  Cardiology  Procedures:   LHC 10/16  Mid RCA lesion, 60 %stenosed.    Non-ST elevation myocardial infarction presentation without definite culprit identified.  Widely patent left main.  Heavily calcified and diffusely diseased mid LAD and second diagonal containing 80 and 70% stenoses respectively, forming a [place] 0, 1,1 bifurcation stenosis. A small third diagonal contains 80%proximal narrowing.  Significant circumflex disease with 90-95% stenosis in the moderate sized first obtuse marginal and 70% obstruction in the continuation of the circumflex beyond the origin of the 1st marginal branch.  The RCA contains mid-vessel intraluminal thrombus with obstruction up to 50% and mid PDA 75% obstruction. Distal left ventricular branch 60-70% narrowing.  Overall normal LV function. EF 60%.  LVEDP is normal, with no evidence of diastolic heart failure.  RECOMMENDATIONS:   Heart team approach: Unclear culprit for presentation, likely the mid RCA which contains thrombus and </= 50% stenosis and otherwise has TIMI-3 flow. The other possibility is the first obtuse marginal branch which is part of a relatively complex bifurcation stenosis involving the circumflex. Additionally, there is high-grade calcified stenosis in the mid LAD and diagonal #2 territory. When all is considered, surgical therapy may be his best option. Further discussion needed. Each PCI option is relatively complex.   Echocardiogram 10/16 Study Conclusions  - Left ventricle: The cavity size was normal. Wall thickness was   normal. Systolic function was normal. The estimated ejection   fraction was in the range of 55% to 60%. Wall motion was normal;   there were no regional wall motion abnormalities. Features are   consistent with a pseudonormal left ventricular filling pattern,   with concomitant abnormal relaxation and increased filling   pressure (grade 2 diastolic dysfunction). - Aortic valve: There was no stenosis. - Mitral valve: There was trivial regurgitation. - Right ventricle: The cavity size was normal. Systolic function   was normal. - Tricuspid valve: Peak RV-RA gradient (S): 36 mm Hg. - Pulmonary arteries: PA peak pressure: 39 mm Hg (S). - Inferior vena cava: The vessel was normal in size. The   respirophasic diameter changes were in the normal range (>= 50%),   consistent with normal central venous pressure.  Impressions:  - Normal LV size and systolic function, EF 32-67%. Moderate   diastolic dysfunction. Normal RV size and systolic function. Mild   pulmonary hypertension.       Subjective: Feeling  well.  NO chest pain, no dyspnea.  Right wrist fine.  No other complaints  Objective: Vitals:   05/13/17 1618 05/13/17 1649 05/13/17 2045 05/14/17 0346  BP: 103/68 (!) 58/43 (!)  110/56 121/71  Pulse: (!) 57 (!) 55 (!) 56 (!) 54  Resp:   20 12  Temp:   98.4 F (36.9 C) 97.9 F (36.6 C)  TempSrc:   Oral Oral  SpO2: 96% 96% 96% 96%  Weight:    82.1 kg (181 lb 1.6 oz)  Height:        Intake/Output Summary (Last 24 hours) at 05/14/17 0823 Last data filed at 05/14/17 0093  Gross per 24 hour  Intake          1272.16 ml  Output             1450 ml  Net          -177.84 ml   Filed Weights   05/12/17 2220 05/13/17 0500 05/14/17 0346  Weight: 81.9 kg (180 lb 8 oz) 81.7 kg (180 lb 3.2 oz) 82.1 kg (181 lb 1.6 oz)    Examination: General appearance: Well appearing adult male, alert and in no acute distress.   HEENT: Anicteric, conjunctiva pink, lids and lashes normal. No nasal deformity, discharge, epistaxis.  Lips moist.   Skin: Warm and dry.  No jaundice.  No suspicious rashes or lesions. Cardiac: RRR, nl S1-S2, no murmurs appreciated.  Capillary refill is brisk.  JVP normal.  No LE edema.  Right wrist site appears well. Respiratory: Normal respiratory rate and rhythm.  CTAB without rales or wheezes. Abdomen: Abdomen soft.  No TTP. No ascites, distension, hepatosplenomegaly.   MSK: No deformities or effusions. Neuro: Awake and alert.  EOMI, moves all extremities. Speech fluent.    Psych: Sensorium intact and responding to questions, attention normal. Affect normal.  Judgment and insight appear normal.    Data Reviewed: I have personally reviewed following labs and imaging studies:  CBC:  Recent Labs Lab 05/12/17 1127 05/14/17 0438  WBC 6.6 6.6  HGB 14.7 13.2  HCT 44.0 39.9  MCV 90.3 91.1  PLT 308 818   Basic Metabolic Panel:  Recent Labs Lab 05/12/17 1127  NA 140  K 3.8  CL 105  CO2 25  GLUCOSE 99  BUN 12  CREATININE 1.09  CALCIUM 9.1   GFR: Estimated Creatinine Clearance: 68.7 mL/min (by C-G formula based on SCr of 1.09 mg/dL). Liver Function Tests: No results for input(s): AST, ALT, ALKPHOS, BILITOT, PROT, ALBUMIN in the last 168  hours. No results for input(s): LIPASE, AMYLASE in the last 168 hours. No results for input(s): AMMONIA in the last 168 hours. Coagulation Profile:  Recent Labs Lab 05/13/17 1034  INR 1.17   Cardiac Enzymes:  Recent Labs Lab 05/12/17 1546 05/12/17 1832 05/13/17 0044 05/13/17 0353 05/13/17 0709  TROPONINI 0.03* 0.07* 0.21* 0.16* 0.11*   BNP (last 3 results) No results for input(s): PROBNP in the last 8760 hours. HbA1C: No results for input(s): HGBA1C in the last 72 hours. CBG: No results for input(s): GLUCAP in the last 168 hours. Lipid Profile:  Recent Labs  05/12/17 1832  CHOL 210*  HDL 44  LDLCALC 141*  TRIG 124  CHOLHDL 4.8   Thyroid Function Tests: No results for input(s): TSH, T4TOTAL, FREET4, T3FREE, THYROIDAB in the last 72 hours. Anemia Panel: No results for input(s): VITAMINB12, FOLATE, FERRITIN, TIBC, IRON, RETICCTPCT in the last 72 hours. Urine analysis: No results found for: COLORURINE,  APPEARANCEUR, LABSPEC, PHURINE, GLUCOSEU, HGBUR, BILIRUBINUR, KETONESUR, PROTEINUR, UROBILINOGEN, NITRITE, LEUKOCYTESUR Sepsis Labs: @LABRCNTIP (procalcitonin:4,lacticidven:4)  )No results found for this or any previous visit (from the past 240 hour(s)).       Radiology Studies: Dg Chest 2 View  Result Date: 05/12/2017 CLINICAL DATA:  Patient had an episode of chest pain radiating to the left shoulder 3 days ago with recurrent symptoms this morning. Persistent left shoulder achiness. No cardiac history. Never smoked. EXAM: CHEST  2 VIEW COMPARISON:  None in PACs FINDINGS: The lungs are adequately inflated and clear. The heart and pulmonary vascularity are normal. The mediastinum is normal in width. The trachea is midline. There is calcification in the wall of the aortic arch. The bony thorax is unremarkable. IMPRESSION: There is no acute cardiopulmonary abnormality. Thoracic aortic atherosclerosis. Electronically Signed   By: David  Martinique M.D.   On: 05/12/2017 11:50         Scheduled Meds: . aspirin  81 mg Oral Daily  . atorvastatin  80 mg Oral q1800  . metoprolol tartrate  12.5 mg Oral BID  . sodium chloride flush  3 mL Intravenous Q12H   Continuous Infusions: . sodium chloride    . heparin 1,250 Units/hr (05/14/17 0705)     LOS: 1 day    Time spent: 20 minutes    Edwin Dada, MD [facility] Pager [phone]  If 7PM-7AM, please contact night-coverage [email or web address] Password [email or web address] 05/14/2017, 8:23 AM

## 2017-05-14 NOTE — Progress Notes (Signed)
Progress Note  Patient Name: Jason Lewis Date of Encounter: 05/14/2017  Primary Cardiologist: Dr. Radford Pax  Subjective   No complaints of CP or SOB  Inpatient Medications    Scheduled Meds: . aspirin  81 mg Oral Daily  . atorvastatin  80 mg Oral q1800  . metoprolol tartrate  12.5 mg Oral BID  . sodium chloride flush  3 mL Intravenous Q12H   Continuous Infusions: . sodium chloride    . heparin 1,250 Units/hr (05/14/17 0705)   PRN Meds: sodium chloride, acetaminophen, ondansetron (ZOFRAN) IV, oxyCODONE, sodium chloride flush   Vital Signs    Vitals:   05/13/17 1618 05/13/17 1649 05/13/17 2045 05/14/17 0346  BP: 103/68 (!) 58/43 (!) 110/56 121/71  Pulse: (!) 57 (!) 55 (!) 56 (!) 54  Resp:   20 12  Temp:   98.4 F (36.9 C) 97.9 F (36.6 C)  TempSrc:   Oral Oral  SpO2: 96% 96% 96% 96%  Weight:    181 lb 1.6 oz (82.1 kg)  Height:        Intake/Output Summary (Last 24 hours) at 05/14/17 0729 Last data filed at 05/14/17 0705  Gross per 24 hour  Intake          1272.16 ml  Output             1450 ml  Net          -177.84 ml   Filed Weights   05/12/17 2220 05/13/17 0500 05/14/17 0346  Weight: 180 lb 8 oz (81.9 kg) 180 lb 3.2 oz (81.7 kg) 181 lb 1.6 oz (82.1 kg)    Telemetry    NSR - Personally Reviewed  ECG    No new EKG to review - Personally Reviewed  Physical Exam   GEN: No acute distress.   Neck: No JVD Cardiac: RRR, no murmurs, rubs, or gallops.  Respiratory: Clear to auscultation bilaterally. GI: Soft, nontender, non-distended  MS: No edema; No deformity. Neuro:  Nonfocal  Psych: Normal affect   Labs    Chemistry Recent Labs Lab 05/12/17 1127  NA 140  K 3.8  CL 105  CO2 25  GLUCOSE 99  BUN 12  CREATININE 1.09  CALCIUM 9.1  GFRNONAA >60  GFRAA >60  ANIONGAP 10     Hematology Recent Labs Lab 05/12/17 1127 05/14/17 0438  WBC 6.6 6.6  RBC 4.87 4.38  HGB 14.7 13.2  HCT 44.0 39.9  MCV 90.3 91.1  MCH 30.2 30.1  MCHC 33.4  33.1  RDW 12.4 12.4  PLT 308 281    Cardiac Enzymes Recent Labs Lab 05/12/17 1832 05/13/17 0044 05/13/17 0353 05/13/17 0709  TROPONINI 0.07* 0.21* 0.16* 0.11*   No results for input(s): TROPIPOC in the last 168 hours.   BNPNo results for input(s): BNP, PROBNP in the last 168 hours.   DDimer No results for input(s): DDIMER in the last 168 hours.   Radiology    Dg Chest 2 View  Result Date: 05/12/2017 CLINICAL DATA:  Patient had an episode of chest pain radiating to the left shoulder 3 days ago with recurrent symptoms this morning. Persistent left shoulder achiness. No cardiac history. Never smoked. EXAM: CHEST  2 VIEW COMPARISON:  None in PACs FINDINGS: The lungs are adequately inflated and clear. The heart and pulmonary vascularity are normal. The mediastinum is normal in width. The trachea is midline. There is calcification in the wall of the aortic arch. The bony thorax is unremarkable. IMPRESSION: There is  no acute cardiopulmonary abnormality. Thoracic aortic atherosclerosis. Electronically Signed   By: David  Martinique M.D.   On: 05/12/2017 11:50    Cardiac Studies   2D echo 04/2017 Study Conclusions  - Left ventricle: The cavity size was normal. Wall thickness was   normal. Systolic function was normal. The estimated ejection   fraction was in the range of 55% to 60%. Wall motion was normal;   there were no regional wall motion abnormalities. Features are   consistent with a pseudonormal left ventricular filling pattern,   with concomitant abnormal relaxation and increased filling   pressure (grade 2 diastolic dysfunction). - Aortic valve: There was no stenosis. - Mitral valve: There was trivial regurgitation. - Right ventricle: The cavity size was normal. Systolic function   was normal. - Tricuspid valve: Peak RV-RA gradient (S): 36 mm Hg. - Pulmonary arteries: PA peak pressure: 39 mm Hg (S). - Inferior vena cava: The vessel was normal in size. The   respirophasic  diameter changes were in the normal range (>= 50%),   consistent with normal central venous pressure.  Impressions:  - Normal LV size and systolic function, EF 80-99%. Moderate   diastolic dysfunction. Normal RV size and systolic function. Mild   pulmonary hypertension.  Cardiac Cath 04/2017 Conclusion     Mid RCA lesion, 60 %stenosed.    Non-ST elevation myocardial infarction presentation without definite culprit identified.  Widely patent left main.  Heavily calcified and diffusely diseased mid LAD and second diagonal containing 80 and 70% stenoses respectively, forming a Medina 0, 1,1 bifurcation stenosis. A small third diagonal contains 80%proximal narrowing.  Significant circumflex disease with 90-95% stenosis in the moderate sized first obtuse marginal and 70% obstruction in the continuation of the circumflex beyond the origin of the 1st marginal branch.  The RCA contains mid-vessel intraluminal thrombus with obstruction up to 50% and mid PDA 75% obstruction. Distal left ventricular branch 60-70% narrowing.  Overall normal LV function. EF 60%. LVEDP is normal, with no evidence of diastolic heart failure.  RECOMMENDATIONS:   Heart team approach: Unclear culprit for presentation, likely the mid RCA which contains thrombus and </= 50% stenosis and otherwise has TIMI-3 flow. The other possibility is the first obtuse marginal branch which is part of a relatively complex bifurcation stenosis involving the circumflex. Additionally, there is high-grade calcified stenosis in the mid LAD and diagonal #2 territory. When all is considered, surgical therapy may be his best option. Further discussion needed. Each PCI option is relatively complex.      Patient Profile     66 y.o. male with no significant past medical history who presented to the EDwith episodic chest pain. Symptoms began about a week ago while working. 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 He has been  having episodic "weak spells" accompanied by retrosternal chest tightness and left arm aching. There is associated shortness of breath. Each episode may last 15 minutes and then he may not experience any symptoms for 2 days  Assessment & Plan   NSTEMI -troponin pk 0.21 and trending downward. 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 - continue  ASA 81 daily, IV heparin gtt, statin and BB.  ASCAD - severe 3 vessel with several branch point stenosis in major epicardial vessels.  - appreciate CVTS input.  Patient reluctant to have CABG and more interested in multivessel staged PCI. - I had a long talk with him regarding options for  revascularization.  He understands that CABG will have the best long term patency compared with multivessel PCI.  Due to the complexity of his disease there is higher risk for complications with PCI as well.  At this time he is likely going to choose CABG.  I will have Dr. Prescott Gum come back to talk with him further.  HTN - BP well controlled after adding BB. - continue metoprolol  HLD LDL is 141  - continue high dose lipitor. - will need repeat FLP and ALT in 6 weeks.    For questions or updates, please contact [place] Please consult [email or web address] for contact info under Cardiology/STEMI.      Signed, Fransico Him, MD  05/14/2017, 7:29 AM

## 2017-05-15 ENCOUNTER — Inpatient Hospital Stay (HOSPITAL_COMMUNITY): Payer: PPO

## 2017-05-15 LAB — PULMONARY FUNCTION TEST
DL/VA % pred: 90 %
DL/VA: 4.04 ml/min/mmHg/L
DLCO cor % pred: 83 %
DLCO cor: 24.61 ml/min/mmHg
DLCO unc % pred: 81 %
DLCO unc: 24.11 ml/min/mmHg
FEF 25-75 Post: 3.03 L/sec
FEF 25-75 Pre: 3.08 L/sec
FEF2575-%Change-Post: -1 %
FEF2575-%Pred-Post: 124 %
FEF2575-%Pred-Pre: 126 %
FEV1-%Change-Post: 0 %
FEV1-%Pred-Post: 96 %
FEV1-%Pred-Pre: 96 %
FEV1-Post: 3 L
FEV1-Pre: 3 L
FEV1FVC-%Change-Post: 0 %
FEV1FVC-%Pred-Pre: 107 %
FEV6-%Change-Post: 0 %
FEV6-%Pred-Post: 94 %
FEV6-%Pred-Pre: 93 %
FEV6-Post: 3.73 L
FEV6-Pre: 3.71 L
FEV6FVC-%Change-Post: 1 %
FEV6FVC-%Pred-Post: 105 %
FEV6FVC-%Pred-Pre: 103 %
FVC-%Change-Post: 0 %
FVC-%Pred-Post: 89 %
FVC-%Pred-Pre: 89 %
FVC-Post: 3.74 L
FVC-Pre: 3.76 L
Post FEV1/FVC ratio: 80 %
Post FEV6/FVC ratio: 100 %
Pre FEV1/FVC ratio: 80 %
Pre FEV6/FVC Ratio: 99 %
RV % pred: 124 %
RV: 2.84 L
TLC % pred: 103 %
TLC: 6.85 L

## 2017-05-15 LAB — CBC
HCT: 40.6 % (ref 39.0–52.0)
Hemoglobin: 13.9 g/dL (ref 13.0–17.0)
MCH: 30.9 pg (ref 26.0–34.0)
MCHC: 34.2 g/dL (ref 30.0–36.0)
MCV: 90.2 fL (ref 78.0–100.0)
Platelets: 260 10*3/uL (ref 150–400)
RBC: 4.5 MIL/uL (ref 4.22–5.81)
RDW: 12.5 % (ref 11.5–15.5)
WBC: 8.9 10*3/uL (ref 4.0–10.5)

## 2017-05-15 LAB — URINALYSIS, ROUTINE W REFLEX MICROSCOPIC
Bilirubin Urine: NEGATIVE
Glucose, UA: NEGATIVE mg/dL
Hgb urine dipstick: NEGATIVE
Ketones, ur: NEGATIVE mg/dL
Leukocytes, UA: NEGATIVE
Nitrite: NEGATIVE
Protein, ur: NEGATIVE mg/dL
Specific Gravity, Urine: 1.014 (ref 1.005–1.030)
pH: 5 (ref 5.0–8.0)

## 2017-05-15 LAB — HEPARIN LEVEL (UNFRACTIONATED): HEPARIN UNFRACTIONATED: 0.59 [IU]/mL (ref 0.30–0.70)

## 2017-05-15 LAB — BASIC METABOLIC PANEL
Anion gap: 8 (ref 5–15)
BUN: 11 mg/dL (ref 6–20)
CHLORIDE: 104 mmol/L (ref 101–111)
CO2: 25 mmol/L (ref 22–32)
Calcium: 9.2 mg/dL (ref 8.9–10.3)
Creatinine, Ser: 0.98 mg/dL (ref 0.61–1.24)
GFR calc non Af Amer: >60 mL/min (ref >60)
Glucose, Bld: 105 mg/dL — ABNORMAL HIGH (ref 65–99)
Potassium: 3.9 mmol/L (ref 3.5–5.1)
SODIUM: 137 mmol/L (ref 135–145)

## 2017-05-15 LAB — SURGICAL PCR SCREEN
MRSA, PCR: NEGATIVE
Staphylococcus aureus: NEGATIVE

## 2017-05-15 MED ORDER — ALBUTEROL SULFATE (2.5 MG/3ML) 0.083% IN NEBU
2.5000 mg | INHALATION_SOLUTION | Freq: Once | RESPIRATORY_TRACT | Status: AC
  Administered 2017-05-15: 2.5 mg via RESPIRATORY_TRACT

## 2017-05-15 NOTE — Progress Notes (Signed)
ANTICOAGULATION CONSULT NOTE - Follow Up Consult  Pharmacy Consult for Heparin Indication: Multi-vessel CAD  No Known Allergies  Patient Measurements: Height: 5\' 8"  (172.7 cm) Weight: 180 lb 11.2 oz (82 kg) IBW/kg (Calculated) : 68.4 Heparin Dosing Weight: 82 kg  Vital Signs: Temp: 97.7 F (36.5 C) (10/18 0443) Temp Source: Oral (10/18 0443) BP: 111/80 (10/18 1109) Pulse Rate: 60 (10/18 1109)  Labs:  Recent Labs  05/13/17 0044 05/13/17 0353 05/13/17 0709 05/13/17 1034 05/14/17 0438 05/14/17 1216 05/15/17 0246 05/15/17 1026  HGB  --   --   --   --  13.2  --  13.9  --   HCT  --   --   --   --  39.9  --  40.6  --   PLT  --   --   --   --  281  --  260  --   LABPROT  --   --   --  14.8  --   --   --   --   INR  --   --   --  1.17  --   --   --   --   HEPARINUNFRC  --   --   --   --  0.11* 0.35 0.59  --   CREATININE  --   --   --   --   --   --   --  0.98  TROPONINI 0.21* 0.16* 0.11*  --   --   --   --   --     Estimated Creatinine Clearance: 70.8 mL/min (by C-G formula based on SCr of 0.98 mg/dL).   Assessment:  Anticoag: CP. NSTEMI by troponin. No bolus in setting of recent cath 10/16. Resumed post-cath. Needs PCI to RCA, LAD. CBC remains WNL.HL 0.59 in goal. CVTS consulted. Pt deciding on PCI vs CABG.  Goal of Therapy:  Heparin level 0.3-0.7 units/ml Monitor platelets by anticoagulation protocol: Yes   Plan:  Heparin 1250 units/hr Daily HL and CBC  Jason Lewis [place], PharmD, BCPS Clinical Staff Pharmacist Pager [phone]  Eilene Ghazi Stillinger 05/15/2017,12:30 PM

## 2017-05-15 NOTE — Progress Notes (Signed)
Progress Note  Patient Name: Jason Lewis Date of Encounter: 05/15/2017  Primary Cardiologist: Dr. Radford Pax  Subjective   He denies any chest pain or SOB.  Anxious to talk again with CVTS  Inpatient Medications    Scheduled Meds: . aspirin  81 mg Oral Daily  . atorvastatin  80 mg Oral q1800  . metoprolol tartrate  12.5 mg Oral BID  . sodium chloride flush  3 mL Intravenous Q12H   Continuous Infusions: . sodium chloride    . heparin 1,250 Units/hr (05/14/17 1717)   PRN Meds: sodium chloride, acetaminophen, ondansetron (ZOFRAN) IV, oxyCODONE, sodium chloride flush   Vital Signs    Vitals:   05/14/17 1342 05/14/17 2007 05/14/17 2115 05/15/17 0443  BP: 113/68 102/62 115/73 116/67  Pulse:  (!) 59 64 (!) 58  Resp:    20  Temp: 98 F (36.7 C) 98.7 F (37.1 C)  97.7 F (36.5 C)  TempSrc: Oral Oral  Oral  SpO2:  96%  98%  Weight:    180 lb 11.2 oz (82 kg)  Height:        Intake/Output Summary (Last 24 hours) at 05/15/17 0913 Last data filed at 05/15/17 0600  Gross per 24 hour  Intake           886.46 ml  Output                0 ml  Net           886.46 ml   Filed Weights   05/13/17 0500 05/14/17 0346 05/15/17 0443  Weight: 180 lb 3.2 oz (81.7 kg) 181 lb 1.6 oz (82.1 kg) 180 lb 11.2 oz (82 kg)    Telemetry    NSR - Personally Reviewed  ECG    No new EKG to review - Personally Reviewed  Physical Exam   GEN: Well nourished, well developed in no acute distress HEENT: Normal NECK: No JVD; No carotid bruits LYMPHATICS: No lymphadenopathy CARDIAC:RRR, no murmurs, rubs, gallops RESPIRATORY:  Clear to auscultation without rales, wheezing or rhonchi  ABDOMEN: Soft, non-tender, non-distended MUSCULOSKELETAL:  No edema; No deformity  SKIN: Warm and dry NEUROLOGIC:  Alert and oriented x 3 PSYCHIATRIC:  Normal affect    Labs    Chemistry  Recent Labs Lab 05/12/17 1127  NA 140  K 3.8  CL 105  CO2 25  GLUCOSE 99  BUN 12  CREATININE 1.09  CALCIUM  9.1  GFRNONAA >60  GFRAA >60  ANIONGAP 10     Hematology  Recent Labs Lab 05/12/17 1127 05/14/17 0438 05/15/17 0246  WBC 6.6 6.6 8.9  RBC 4.87 4.38 4.50  HGB 14.7 13.2 13.9  HCT 44.0 39.9 40.6  MCV 90.3 91.1 90.2  MCH 30.2 30.1 30.9  MCHC 33.4 33.1 34.2  RDW 12.4 12.4 12.5  PLT 308 281 260    Cardiac Enzymes  Recent Labs Lab 05/12/17 1832 05/13/17 0044 05/13/17 0353 05/13/17 0709  TROPONINI 0.07* 0.21* 0.16* 0.11*   No results for input(s): TROPIPOC in the last 168 hours.   BNPNo results for input(s): BNP, PROBNP in the last 168 hours.   DDimer No results for input(s): DDIMER in the last 168 hours.   Radiology    No results found.  Cardiac Studies   2D echo 04/2017 Study Conclusions  - Left ventricle: The cavity size was normal. Wall thickness was   normal. Systolic function was normal. The estimated ejection   fraction was in the range of  55% to 60%. Wall motion was normal;   there were no regional wall motion abnormalities. Features are   consistent with a pseudonormal left ventricular filling pattern,   with concomitant abnormal relaxation and increased filling   pressure (grade 2 diastolic dysfunction). - Aortic valve: There was no stenosis. - Mitral valve: There was trivial regurgitation. - Right ventricle: The cavity size was normal. Systolic function   was normal. - Tricuspid valve: Peak RV-RA gradient (S): 36 mm Hg. - Pulmonary arteries: PA peak pressure: 39 mm Hg (S). - Inferior vena cava: The vessel was normal in size. The   respirophasic diameter changes were in the normal range (>= 50%),   consistent with normal central venous pressure.  Impressions:  - Normal LV size and systolic function, EF 78-29%. Moderate   diastolic dysfunction. Normal RV size and systolic function. Mild   pulmonary hypertension.  Cardiac Cath 04/2017 Conclusion     Mid RCA lesion, 60 %stenosed.    Non-ST elevation myocardial infarction presentation  without definite culprit identified.  Widely patent left main.  Heavily calcified and diffusely diseased mid LAD and second diagonal containing 80 and 70% stenoses respectively, forming a Medina 0, 1,1 bifurcation stenosis. A small third diagonal contains 80%proximal narrowing.  Significant circumflex disease with 90-95% stenosis in the moderate sized first obtuse marginal and 70% obstruction in the continuation of the circumflex beyond the origin of the 1st marginal branch.  The RCA contains mid-vessel intraluminal thrombus with obstruction up to 50% and mid PDA 75% obstruction. Distal left ventricular branch 60-70% narrowing.  Overall normal LV function. EF 60%. LVEDP is normal, with no evidence of diastolic heart failure.  RECOMMENDATIONS:   Heart team approach: Unclear culprit for presentation, likely the mid RCA which contains thrombus and </= 50% stenosis and otherwise has TIMI-3 flow. The other possibility is the first obtuse marginal branch which is part of a relatively complex bifurcation stenosis involving the circumflex. Additionally, there is high-grade calcified stenosis in the mid LAD and diagonal #2 territory. When all is considered, surgical therapy may be his best option. Further discussion needed. Each PCI option is relatively complex.      Patient Profile     66 y.o. male with no significant past medical history who presented to the EDwith episodic chest pain. Symptoms began about a week ago while working. 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 He has been having episodic "weak spells" accompanied by retrosternal chest tightness and left arm aching. There is associated shortness of breath. Each episode may last 15 minutes and then he may not experience any symptoms for 2 days  Assessment & Plan   NSTEMI -troponin pk 0.21 and trending downward. 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 - continue   ASA 81 daily, IV heparin gtt, statin and BB.  ASCAD - severe 3 vessel with several branch point stenosis in major epicardial vessels.  - appreciate CVTS input.  Patient reluctant to have CABG and more interested in multivessel staged PCI. - I had a long talk with him regarding options for revascularization.  He understands that CABG will have the best long term patency compared with multivessel PCI.  Due to the complexity of his disease there is higher risk for complications with PCI as well.  At this time he is likely going to choose  - he is anxious to talk again with CVTS to get plan going forward for when CABG  will occur - will call CVTS to see him today to answer all his questions.  HTN - BP well controlled at 116/33mmHg today after adding BB. - continue metoprolol  HLD LDL is 141  - continue high dose lipitor. - will need repeat FLP and ALT in 6 weeks.    For questions or updates, please contact [place] Please consult [email or web address] for contact info under Cardiology/STEMI.      Signed, Fransico Him, MD  05/15/2017, 9:13 AM

## 2017-05-15 NOTE — Progress Notes (Signed)
PROGRESS NOTE  Jason Lewis GMW:[id number] DOB: 11-21-1950 DOA: 05/12/2017 PCP: Deland Pretty, MD   LOS: 2 days   Brief Narrative / Interim history: 66 yo M with no sig PMHx presents with intermittent progressive CP found to have NSTEMI.  Cath on 10/16 showed severe 3V disease and multiple branch point stenoses with some residual RCA thrombus.  Assessment & Plan: Principal Problem:   Chest pain Active Problems:   Coronary artery disease   Non-ST elevation (NSTEMI) myocardial infarction [facility])   NSTEMI (non-ST elevated myocardial infarction) ([place])   NSTEMI / CAD -cardiology and TCV following, patient to discuss with surgery today.  -to undergo multi PCI vs CABG, patient still deciding -continue heparin infusion, continue beta-blockers, continue atorvastatin   DVT prophylaxis: heparin infusion Code Status: Full code Family Communication: wife at bedside Disposition Plan: home  Consultants:   Cardiology  TCV  Procedures:   LHC 10/16  Mid RCA lesion, 60 %stenosed.   Non-ST elevation myocardial infarction presentation without definite culprit identified.  Widely patent left main.  Heavily calcified and diffusely diseased mid LAD and second diagonal containing 80 and 70% stenoses respectively, forming a Medina 0, 1,1 bifurcation stenosis. A small third diagonal contains 80%proximal narrowing.  Significant circumflex disease with 90-95% stenosis in the moderate sized first obtuse marginal and 70% obstruction in the continuation of the circumflex beyond the origin of the 1st marginal branch.  The RCA contains mid-vessel intraluminal thrombus with obstruction up to 50% and mid PDA 75% obstruction. Distal left ventricular branch 60-70% narrowing.  Overall normal LV function. EF 60%. LVEDP is normal, with no evidence of diastolic heart failure.  RECOMMENDATIONS:   Heart team approach: Unclear culprit for presentation, likely the mid RCA which contains thrombus and  </= 50% stenosis and otherwise has TIMI-3 flow. The other possibility is the first obtuse marginal branch which is part of a relatively complex bifurcation stenosis involving the circumflex. Additionally, there is high-grade calcified stenosis in the mid LAD and diagonal #2 territory. When all is considered, surgical therapy may be his best option. Further discussion needed. Each PCI option is relatively complex.   Echocardiogram 10/16 Study Conclusions - Left ventricle: The cavity size was normal. Wall thickness wasnormal. Systolic function was normal. The estimated ejectionfraction was in the range of 55% to 60%. Wall motion was normal;there were no regional wall motion abnormalities. Features areconsistent with a pseudonormal left ventricular filling pattern,with concomitant abnormal relaxation and increased fillingpressure (grade 2 diastolic dysfunction). - Aortic valve: There was no stenosis. - Mitral valve: There was trivial regurgitation. - Right ventricle: The cavity size was normal. Systolic functionwas normal. - Tricuspid valve: Peak RV-RA gradient (S): 36 mm Hg. - Pulmonary arteries: PA peak pressure: 39 mm Hg (S). - Inferior vena cava: The vessel was normal in size. Therespirophasic diameter changes were in the normal range (>= 50%),consistent with normal central venous pressure.  Impressions:  - Normal LV size and systolic function, EF 44-03%. Moderatediastolic dysfunction. Normal RV size and systolic function. Mildpulmonary hypertension.  Antimicrobials:  None    Subjective: No chest pain, no shortness of breath.  Ambulating in hallway without problems.  Objective: Vitals:   05/14/17 2115 05/15/17 0443 05/15/17 1109 05/15/17 1315  BP: 115/73 116/67 111/80 (!) 104/58  Pulse: 64 (!) 58 60   Resp:  20    Temp:  97.7 F (36.5 C)  98.1 F (36.7 C)  TempSrc:  Oral  Oral  SpO2:  98%    Weight:  82 kg (  180 lb 11.2 oz)    Height:        Intake/Output Summary  (Last 24 hours) at 05/15/17 1318 Last data filed at 05/15/17 1316  Gross per 24 hour  Intake           626.46 ml  Output                0 ml  Net           626.46 ml   Filed Weights   05/13/17 0500 05/14/17 0346 05/15/17 0443  Weight: 81.7 kg (180 lb 3.2 oz) 82.1 kg (181 lb 1.6 oz) 82 kg (180 lb 11.2 oz)    Examination:  Constitutional: NAD Respiratory: CTA biL Cardiovascular: RRR   Data Reviewed: I have independently reviewed following labs and imaging studies   CBC:  Recent Labs Lab 05/12/17 1127 05/14/17 0438 05/15/17 0246  WBC 6.6 6.6 8.9  HGB 14.7 13.2 13.9  HCT 44.0 39.9 40.6  MCV 90.3 91.1 90.2  PLT 308 281 413   Basic Metabolic Panel:  Recent Labs Lab 05/12/17 1127 05/15/17 1026  NA 140 137  K 3.8 3.9  CL 105 104  CO2 25 25  GLUCOSE 99 105*  BUN 12 11  CREATININE 1.09 0.98  CALCIUM 9.1 9.2   GFR: Estimated Creatinine Clearance: 70.8 mL/min (by C-G formula based on SCr of 0.98 mg/dL). Liver Function Tests: No results for input(s): AST, ALT, ALKPHOS, BILITOT, PROT, ALBUMIN in the last 168 hours. No results for input(s): LIPASE, AMYLASE in the last 168 hours. No results for input(s): AMMONIA in the last 168 hours. Coagulation Profile:  Recent Labs Lab 05/13/17 1034  INR 1.17   Cardiac Enzymes:  Recent Labs Lab 05/12/17 1546 05/12/17 1832 05/13/17 0044 05/13/17 0353 05/13/17 0709  TROPONINI 0.03* 0.07* 0.21* 0.16* 0.11*   BNP (last 3 results) No results for input(s): PROBNP in the last 8760 hours. HbA1C: No results for input(s): HGBA1C in the last 72 hours. CBG: No results for input(s): GLUCAP in the last 168 hours. Lipid Profile:  Recent Labs  05/12/17 1832  CHOL 210*  HDL 44  LDLCALC 141*  TRIG 124  CHOLHDL 4.8   Thyroid Function Tests: No results for input(s): TSH, T4TOTAL, FREET4, T3FREE, THYROIDAB in the last 72 hours. Anemia Panel: No results for input(s): VITAMINB12, FOLATE, FERRITIN, TIBC, IRON, RETICCTPCT in the  last 72 hours. Urine analysis: No results found for: COLORURINE, APPEARANCEUR, LABSPEC, PHURINE, GLUCOSEU, HGBUR, BILIRUBINUR, KETONESUR, PROTEINUR, UROBILINOGEN, NITRITE, LEUKOCYTESUR Sepsis Labs: Invalid input(s): PROCALCITONIN, LACTICIDVEN  No results found for this or any previous visit (from the past 240 hour(s)).    Radiology Studies: No results found.   Scheduled Meds: . aspirin  81 mg Oral Daily  . atorvastatin  80 mg Oral q1800  . metoprolol tartrate  12.5 mg Oral BID  . sodium chloride flush  3 mL Intravenous Q12H   Continuous Infusions: . sodium chloride    . heparin 1,250 Units/hr (05/14/17 1717)     Marzetta Board, MD, PhD [facility] Pager [phone]  If 7PM-7AM, please contact night-coverage [email or web address] Password [email or web address] 05/15/2017, 1:18 PM

## 2017-05-15 NOTE — Progress Notes (Signed)
2 Days Post-Op Procedure(s) (LRB): LEFT HEART CATH AND CORONARY ANGIOGRAPHY (N/A) Ultrasound Guidance For Vascular Access Subjective: 3v CAD with unstable angina On preop iv heparin to allow resolution of RCA thrombus PFTs, CXR ok Pre cabadopplers pending CABG sched for Mon am   Objective: Vital signs in last 24 hours: Temp:  [97.7 F (36.5 C)-98.7 F (37.1 C)] 98.1 F (36.7 C) (10/18 1315) Pulse Rate:  [58-64] 60 (10/18 1109) Cardiac Rhythm: Normal sinus rhythm;Sinus bradycardia (10/18 0830) Resp:  [20] 20 (10/18 0443) BP: (102-116)/(58-80) 104/58 (10/18 1315) SpO2:  [96 %-98 %] 98 % (10/18 0443) Weight:  [180 lb 11.2 oz (82 kg)] 180 lb 11.2 oz (82 kg) (10/18 0443)  Hemodynamic parameters for last 24 hours:    Intake/Output from previous day: 10/17 0701 - 10/18 0700 In: 967.3 [P.O.:600; I.V.:367.3] Out: 300 [Urine:300] Intake/Output this shift: Total I/O In: 100 [P.O.:100] Out: -        Exam    General- alert and comfortable   Lungs- clear without rales, wheezes   Cor- regular rate and rhythm, no murmur , gallop   Abdomen- soft, non-tender   Extremities - warm, non-tender, minimal edema   Neuro- oriented, appropriate, no focal weakness   Lab Results:  Recent Labs  05/14/17 0438 05/15/17 0246  WBC 6.6 8.9  HGB 13.2 13.9  HCT 39.9 40.6  PLT 281 260   BMET:  Recent Labs  05/15/17 1026  NA 137  K 3.9  CL 104  CO2 25  GLUCOSE 105*  BUN 11  CREATININE 0.98  CALCIUM 9.2    PT/INR:  Recent Labs  05/13/17 1034  LABPROT 14.8  INR 1.17   ABG No results found for: PHART, HCO3, TCO2, ACIDBASEDEF, O2SAT CBG (last 3)  No results for input(s): GLUCAP in the last 72 hours.  Assessment/Plan: S/P Procedure(s) (LRB): LEFT HEART CATH AND CORONARY ANGIOGRAPHY (N/A) Ultrasound Guidance For Vascular Access Cont iv hep until surg10-22   LOS: 2 days    Tharon Aquas Trigt III 05/15/2017

## 2017-05-16 ENCOUNTER — Inpatient Hospital Stay (HOSPITAL_COMMUNITY): Payer: PPO

## 2017-05-16 DIAGNOSIS — Z01810 Encounter for preprocedural cardiovascular examination: Secondary | ICD-10-CM

## 2017-05-16 DIAGNOSIS — R7989 Other specified abnormal findings of blood chemistry: Secondary | ICD-10-CM

## 2017-05-16 DIAGNOSIS — E785 Hyperlipidemia, unspecified: Secondary | ICD-10-CM

## 2017-05-16 LAB — CBC
HEMATOCRIT: 44.1 % (ref 39.0–52.0)
Hemoglobin: 14.6 g/dL (ref 13.0–17.0)
MCH: 30.2 pg (ref 26.0–34.0)
MCHC: 33.1 g/dL (ref 30.0–36.0)
MCV: 91.1 fL (ref 78.0–100.0)
Platelets: 299 10*3/uL (ref 150–400)
RBC: 4.84 MIL/uL (ref 4.22–5.81)
RDW: 12.8 % (ref 11.5–15.5)
WBC: 7.9 10*3/uL (ref 4.0–10.5)

## 2017-05-16 LAB — PROTIME-INR
INR: 0.97
Prothrombin Time: 12.8 seconds (ref 11.4–15.2)

## 2017-05-16 LAB — TSH: TSH: 9.087 u[IU]/mL — ABNORMAL HIGH (ref 0.350–4.500)

## 2017-05-16 LAB — HEPARIN LEVEL (UNFRACTIONATED): Heparin Unfractionated: 0.48 IU/mL (ref 0.30–0.70)

## 2017-05-16 NOTE — Progress Notes (Signed)
Progress Note  Patient Name: Jason Lewis Date of Encounter: 05/16/2017  Primary Cardiologist: Dr. Radford Pax  Subjective   Denies any chest pain or SOB  Inpatient Medications    Scheduled Meds: . aspirin  81 mg Oral Daily  . atorvastatin  80 mg Oral q1800  . metoprolol tartrate  12.5 mg Oral BID  . sodium chloride flush  3 mL Intravenous Q12H   Continuous Infusions: . sodium chloride    . heparin 1,250 Units/hr (05/16/17 0507)   PRN Meds: sodium chloride, acetaminophen, ondansetron (ZOFRAN) IV, oxyCODONE, sodium chloride flush   Vital Signs    Vitals:   05/15/17 1109 05/15/17 1315 05/15/17 2131 05/16/17 0456  BP: 111/80 (!) 104/58 122/74 115/73  Pulse: 60  (!) 57 (!) 57  Resp:      Temp:  98.1 F (36.7 C) 97.9 F (36.6 C) (!) 97.5 F (36.4 C)  TempSrc:  Oral Oral Oral  SpO2:   98% 97%  Weight:    181 lb (82.1 kg)  Height:        Intake/Output Summary (Last 24 hours) at 05/16/17 6063 Last data filed at 05/16/17 0600  Gross per 24 hour  Intake              400 ml  Output                0 ml  Net              400 ml   Filed Weights   05/14/17 0346 05/15/17 0443 05/16/17 0456  Weight: 181 lb 1.6 oz (82.1 kg) 180 lb 11.2 oz (82 kg) 181 lb (82.1 kg)    Telemetry    NSR- Personally Reviewed  ECG    No new EKG to review - Personally Reviewed  Physical Exam   tGEN: Well nourished, well developed in no acute distress HEENT: Normal NECK: No JVD; No carotid bruits LYMPHATICS: No lymphadenopathy CARDIAC:RRR, no murmurs, rubs, gallops RESPIRATORY:  Clear to auscultation without rales, wheezing or rhonchi  ABDOMEN: Soft, non-tender, non-distended MUSCULOSKELETAL:  No edema; No deformity  SKIN: Warm and dry NEUROLOGIC:  Alert and oriented x 3 PSYCHIATRIC:  Normal affect    Labs    Chemistry  Recent Labs Lab 05/12/17 1127 05/15/17 1026  NA 140 137  K 3.8 3.9  CL 105 104  CO2 25 25  GLUCOSE 99 105*  BUN 12 11  CREATININE 1.09 0.98  CALCIUM  9.1 9.2  GFRNONAA >60 >60  GFRAA >60 >60  ANIONGAP 10 8     Hematology  Recent Labs Lab 05/12/17 1127 05/14/17 0438 05/15/17 0246  WBC 6.6 6.6 8.9  RBC 4.87 4.38 4.50  HGB 14.7 13.2 13.9  HCT 44.0 39.9 40.6  MCV 90.3 91.1 90.2  MCH 30.2 30.1 30.9  MCHC 33.4 33.1 34.2  RDW 12.4 12.4 12.5  PLT 308 281 260    Cardiac Enzymes  Recent Labs Lab 05/12/17 1832 05/13/17 0044 05/13/17 0353 05/13/17 0709  TROPONINI 0.07* 0.21* 0.16* 0.11*   No results for input(s): TROPIPOC in the last 168 hours.   BNPNo results for input(s): BNP, PROBNP in the last 168 hours.   DDimer No results for input(s): DDIMER in the last 168 hours.   Radiology    No results found.  Cardiac Studies   2D echo 04/2017 Study Conclusions  - Left ventricle: The cavity size was normal. Wall thickness was   normal. Systolic function was normal. The estimated ejection  fraction was in the range of 55% to 60%. Wall motion was normal;   there were no regional wall motion abnormalities. Features are   consistent with a pseudonormal left ventricular filling pattern,   with concomitant abnormal relaxation and increased filling   pressure (grade 2 diastolic dysfunction). - Aortic valve: There was no stenosis. - Mitral valve: There was trivial regurgitation. - Right ventricle: The cavity size was normal. Systolic function   was normal. - Tricuspid valve: Peak RV-RA gradient (S): 36 mm Hg. - Pulmonary arteries: PA peak pressure: 39 mm Hg (S). - Inferior vena cava: The vessel was normal in size. The   respirophasic diameter changes were in the normal range (>= 50%),   consistent with normal central venous pressure.  Impressions:  - Normal LV size and systolic function, EF 78-29%. Moderate   diastolic dysfunction. Normal RV size and systolic function. Mild   pulmonary hypertension.  Cardiac Cath 04/2017 Conclusion     Mid RCA lesion, 60 %stenosed.    Non-ST elevation myocardial infarction  presentation without definite culprit identified.  Widely patent left main.  Heavily calcified and diffusely diseased mid LAD and second diagonal containing 80 and 70% stenoses respectively, forming a [place] 0, 1,1 bifurcation stenosis. A small third diagonal contains 80%proximal narrowing.  Significant circumflex disease with 90-95% stenosis in the moderate sized first obtuse marginal and 70% obstruction in the continuation of the circumflex beyond the origin of the 1st marginal branch.  The RCA contains mid-vessel intraluminal thrombus with obstruction up to 50% and mid PDA 75% obstruction. Distal left ventricular branch 60-70% narrowing.  Overall normal LV function. EF 60%. LVEDP is normal, with no evidence of diastolic heart failure.  RECOMMENDATIONS:   Heart team approach: Unclear culprit for presentation, likely the mid RCA which contains thrombus and </= 50% stenosis and otherwise has TIMI-3 flow. The other possibility is the first obtuse marginal branch which is part of a relatively complex bifurcation stenosis involving the circumflex. Additionally, there is high-grade calcified stenosis in the mid LAD and diagonal #2 territory. When all is considered, surgical therapy may be his best option. Further discussion needed. Each PCI option is relatively complex.      Patient Profile     66 y.o. male with no significant past medical history who presented to the EDwith episodic chest pain. Symptoms began about a week ago while working. 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 He has been having episodic "weak spells" accompanied by retrosternal chest tightness and left arm aching. There is associated shortness of breath. Each episode may last 15 minutes and then he may not experience any symptoms for 2 days  Assessment & Plan   NSTEMI -troponin pk 0.21 and trending downward.  - Cath with severe multivessel ASCAD with culprit lesion being thrombus in RCC which has minimal  disease. - continue  ASA 81 daily, IV heparin gtt, statin and BB. - plan for continuing Heparin over the weekend due to RCA thrombus - CABG on Monday  ASCAD - severe 3 vessel with several branch point stenosis in major epicardial vessels.  - continue ASA, BB, statin   HTN - BP well controlled at 115/10mmHg today  - continue metoprolol  HLD LDL is 141  - continue high dose lipitor. - will need repeat FLP and ALT in 6 weeks.    For questions or updates, please contact [place] Please consult [email or web address] for contact info under Cardiology/STEMI.      Signed, Fransico Him,  MD  05/16/2017, 8:32 AM

## 2017-05-16 NOTE — Progress Notes (Signed)
PROGRESS NOTE  Jason Lewis DJM:[id number] DOB: [birth year] DOA: 05/12/2017 PCP: Deland Pretty, MD   LOS: 3 days   Brief Narrative / Interim history: 66 yo M with no sig PMHx presents with intermittent progressive CP found to have NSTEMI.  Cath on 10/16 showed severe 3V disease and multiple branch point stenoses with some residual RCA thrombus. Thoracic surgery consulted and plans are in place for patient to undergo CABG on Monday and meanwhile to be maintained on IV heparin  Assessment & Plan: Principal Problem:   Chest pain Active Problems:   Coronary artery disease   Non-ST elevation (NSTEMI) myocardial infarction [facility])   NSTEMI (non-ST elevated myocardial infarction) ([place])   NSTEMI / CAD -cardiology and TCV following, patient to have CABG Monday -continue heparin infusion, continue beta-blockers, continue atorvastatin  Elevated TSH -Patient asymptomatic, suspect subclinical hypothyroidism, will check a free T3 and free T4 for completeness   DVT prophylaxis: heparin infusion Code Status: Full code Family Communication: wife at bedside Disposition Plan: home  Consultants:   Cardiology  TCV  Procedures:   LHC 10/16  Mid RCA lesion, 60 %stenosed.   Non-ST elevation myocardial infarction presentation without definite culprit identified.  Widely patent left main.  Heavily calcified and diffusely diseased mid LAD and second diagonal containing 80 and 70% stenoses respectively, forming a [place] 0, 1,1 bifurcation stenosis. A small third diagonal contains 80%proximal narrowing.  Significant circumflex disease with 90-95% stenosis in the moderate sized first obtuse marginal and 70% obstruction in the continuation of the circumflex beyond the origin of the 1st marginal branch.  The RCA contains mid-vessel intraluminal thrombus with obstruction up to 50% and mid PDA 75% obstruction. Distal left ventricular branch 60-70% narrowing.  Overall normal LV function. EF 60%.  LVEDP is normal, with no evidence of diastolic heart failure.  RECOMMENDATIONS:   Heart team approach: Unclear culprit for presentation, likely the mid RCA which contains thrombus and </= 50% stenosis and otherwise has TIMI-3 flow. The other possibility is the first obtuse marginal branch which is part of a relatively complex bifurcation stenosis involving the circumflex. Additionally, there is high-grade calcified stenosis in the mid LAD and diagonal #2 territory. When all is considered, surgical therapy may be his best option. Further discussion needed. Each PCI option is relatively complex.   Echocardiogram 10/16 Study Conclusions - Left ventricle: The cavity size was normal. Wall thickness wasnormal. Systolic function was normal. The estimated ejectionfraction was in the range of 55% to 60%. Wall motion was normal;there were no regional wall motion abnormalities. Features areconsistent with a pseudonormal left ventricular filling pattern,with concomitant abnormal relaxation and increased fillingpressure (grade 2 diastolic dysfunction). - Aortic valve: There was no stenosis. - Mitral valve: There was trivial regurgitation. - Right ventricle: The cavity size was normal. Systolic functionwas normal. - Tricuspid valve: Peak RV-RA gradient (S): 36 mm Hg. - Pulmonary arteries: PA peak pressure: 39 mm Hg (S). - Inferior vena cava: The vessel was normal in size. Therespirophasic diameter changes were in the normal range (>= 50%),consistent with normal central venous pressure.  Impressions:  - Normal LV size and systolic function, EF 22-29%. Moderatediastolic dysfunction. Normal RV size and systolic function. Mildpulmonary hypertension.  Antimicrobials:  None    Subjective: -No complaints, no chest pain, no shortness of breath.  Ambulates in the hallway without difficulties.  Objective: Vitals:   05/15/17 1109 05/15/17 1315 05/15/17 2131 05/16/17 0456  BP: 111/80 (!) 104/58  122/74 115/73  Pulse: 60  (!) 57 (!) 57  Resp:      Temp:  98.1 F (36.7 C) 97.9 F (36.6 C) (!) 97.5 F (36.4 C)  TempSrc:  Oral Oral Oral  SpO2:   98% 97%  Weight:    82.1 kg (181 lb)  Height:        Intake/Output Summary (Last 24 hours) at 05/16/17 1303 Last data filed at 05/16/17 0600  Gross per 24 hour  Intake              400 ml  Output                0 ml  Net              400 ml   Filed Weights   05/14/17 0346 05/15/17 0443 05/16/17 0456  Weight: 82.1 kg (181 lb 1.6 oz) 82 kg (180 lb 11.2 oz) 82.1 kg (181 lb)    Examination:  Constitutional: NAD, calm, comfortable Eyes:  lids and conjunctivae normal ENMT: Mucous membranes are moist.  Respiratory: clear to auscultation bilaterally, no wheezing, no crackles.  Cardiovascular: Regular rate and rhythm, no murmurs / rubs / gallops. No LE edema.  Abdomen: no tenderness. Bowel sounds positive.  Neurologic: non focal   Data Reviewed: I have independently reviewed following labs and imaging studies   CBC:  Recent Labs Lab 05/12/17 1127 05/14/17 0438 05/15/17 0246 05/16/17 0732  WBC 6.6 6.6 8.9 7.9  HGB 14.7 13.2 13.9 14.6  HCT 44.0 39.9 40.6 44.1  MCV 90.3 91.1 90.2 91.1  PLT 308 281 260 578   Basic Metabolic Panel:  Recent Labs Lab 05/12/17 1127 05/15/17 1026  NA 140 137  K 3.8 3.9  CL 105 104  CO2 25 25  GLUCOSE 99 105*  BUN 12 11  CREATININE 1.09 0.98  CALCIUM 9.1 9.2   GFR: Estimated Creatinine Clearance: 76.5 mL/min (by C-G formula based on SCr of 0.98 mg/dL). Liver Function Tests: No results for input(s): AST, ALT, ALKPHOS, BILITOT, PROT, ALBUMIN in the last 168 hours. No results for input(s): LIPASE, AMYLASE in the last 168 hours. No results for input(s): AMMONIA in the last 168 hours. Coagulation Profile:  Recent Labs Lab 05/13/17 1034 05/16/17 0732  INR 1.17 0.97   Cardiac Enzymes:  Recent Labs Lab 05/12/17 1546 05/12/17 1832 05/13/17 0044 05/13/17 0353 05/13/17 0709    TROPONINI 0.03* 0.07* 0.21* 0.16* 0.11*   BNP (last 3 results) No results for input(s): PROBNP in the last 8760 hours. HbA1C: No results for input(s): HGBA1C in the last 72 hours. CBG: No results for input(s): GLUCAP in the last 168 hours. Lipid Profile: No results for input(s): CHOL, HDL, LDLCALC, TRIG, CHOLHDL, LDLDIRECT in the last 72 hours. Thyroid Function Tests:  Recent Labs  05/16/17 0732  TSH 9.087*   Anemia Panel: No results for input(s): VITAMINB12, FOLATE, FERRITIN, TIBC, IRON, RETICCTPCT in the last 72 hours. Urine analysis:    Component Value Date/Time   COLORURINE YELLOW 05/15/2017 1923   APPEARANCEUR CLEAR 05/15/2017 1923   LABSPEC 1.014 05/15/2017 [place] 5.0 05/15/2017 [place] NEGATIVE 05/15/2017 [place] NEGATIVE 05/15/2017 [place] NEGATIVE 05/15/2017 [place] NEGATIVE 05/15/2017 [place] NEGATIVE 05/15/2017 1923   NITRITE NEGATIVE 05/15/2017 1923   LEUKOCYTESUR NEGATIVE 05/15/2017 1923   Sepsis Labs: Invalid input(s): PROCALCITONIN, LACTICIDVEN  Recent Results (from the past 240 hour(s))  Surgical pcr screen     Status: None   Collection Time: 05/15/17  5:46 PM  Result Value Ref Range Status   MRSA, PCR NEGATIVE NEGATIVE Final   Staphylococcus aureus NEGATIVE NEGATIVE Final    Comment: (NOTE) The Xpert SA Assay (FDA approved for NASAL specimens in patients 1 years of age and older), is one component of a comprehensive surveillance program. It is not intended to diagnose infection nor to guide or monitor treatment.       Radiology Studies: No results found.   Scheduled Meds: . aspirin  81 mg Oral Daily  . atorvastatin  80 mg Oral q1800  . metoprolol tartrate  12.5 mg Oral BID  . sodium chloride flush  3 mL Intravenous Q12H   Continuous Infusions: . sodium chloride    . heparin 1,250 Units/hr (05/16/17 0507)     Marzetta Board, MD, PhD [facility] Pager [phone]  If  7PM-7AM, please contact night-coverage [email or web address] Password [email or web address] 05/16/2017, 1:03 PM

## 2017-05-16 NOTE — Progress Notes (Signed)
Nutrition Education Note  Pt is a 66 year old male with intermittment progressive CP found to have NSTEMI. Cath on 10/16 showed severe 3V disease and multiple branch stenoses with some residual RCA thrombus. No significant PMH.  Educated pt and his wife on saturated/ trans fat, sodium content in foods/ reading labels, and tips when eating at restaurants. Discussed and provided the following handouts from [facility] Nutrition Care Manual: "Heart Healthy Reduced Sodium Nutrition Therapy", "Sodium Free Flavoring Tips", and "Heart Healthy Cooking Tips".   Pts wife does most of the cooking and she is eager to make changes for her husband. Pt reports typically eating a honey bun on his drive to work, fast food for lunch, and a meal of fried meat and vegetables cooked by his wife for dinner. Pt and his wife report an interest in eating more fish, vegetables, and reading nutrition labels for sodium and fat content.   Expect good compliance.   Leslie Dietetic Intern Pager: [phone] 05/16/2017 10:19 AM

## 2017-05-16 NOTE — Progress Notes (Signed)
Pre-op Cardiac Surgery  Carotid Findings:  Bilateral: No significant (1-39%) ICA stenosis. Antegrade vertebral flow.    Upper Extremity Right Left  Brachial Pressures 118 112  Radial Waveforms Tri Tri  Ulnar Waveforms Tri Tri  Palmar Arch (Allen's Test) Normal  Normal with radial compression, obliterates with ulnar compression   Findings:  Pedal artery waveforms within normal limits.  Landry Mellow, RDMS, RVT 05/16/2017

## 2017-05-16 NOTE — Progress Notes (Signed)
CARDIAC REHAB PHASE I   Pt ambulating independently, no complaints, declined additional ambulation with cardiac rehab at this time. Cardiac surgery pre-op education completed with pt and wife at bedside. Reviewed IS, sternal precautions, activity progression, cardiac surgery booklet and cardiac surgery guidelines. Pt and wife verbalized understanding, declined cardiac surgery videos. Pt up ad lib, call bell within reach. Will follow post-op.  [place], RN, BSN 05/16/2017 12:31 PM

## 2017-05-16 NOTE — Progress Notes (Signed)
ANTICOAGULATION CONSULT NOTE - Follow Up Consult  Pharmacy Consult for Heparin Indication: Multi-vessel CAD plan CABG 10/22  No Known Allergies  Patient Measurements: Height: 5\' 8"  (172.7 cm) Weight: 181 lb (82.1 kg) IBW/kg (Calculated) : 68.4 Heparin Dosing Weight: 82 kg  Vital Signs: Temp: 99.2 F (37.3 C) (10/19 1422) Temp Source: Oral (10/19 1422) BP: 115/73 (10/19 0456) Pulse Rate: 57 (10/19 0456)  Labs:  Recent Labs  05/14/17 0438 05/14/17 1216 05/15/17 0246 05/15/17 1026 05/16/17 0728 05/16/17 0732  HGB 13.2  --  13.9  --   --  14.6  HCT 39.9  --  40.6  --   --  44.1  PLT 281  --  260  --   --  299  LABPROT  --   --   --   --   --  12.8  INR  --   --   --   --   --  0.97  HEPARINUNFRC 0.11* 0.35 0.59  --  0.48  --   CREATININE  --   --   --  0.98  --   --     Estimated Creatinine Clearance: 76.5 mL/min (by C-G formula based on SCr of 0.98 mg/dL).   Assessment:  Anticoag: CP. NSTEMI by troponin. No bolus in setting of recent cath 10/16. Resumed post-cath.  CBC remains WNL.HL 0.48 at goal on heparin drip rate 1250 uts/hr. CVTS consulted - plan CABG 10/22.  Goal of Therapy:  Heparin level 0.3-0.7 units/ml Monitor platelets by anticoagulation protocol: Yes   Plan:  Heparin 1250 units/hr Daily HL and CBC  Bonnita Nasuti Pharm.D. CPP, BCPS Clinical Pharmacist [phone] 05/16/2017 4:05 PM

## 2017-05-17 DIAGNOSIS — I25119 Atherosclerotic heart disease of native coronary artery with unspecified angina pectoris: Secondary | ICD-10-CM

## 2017-05-17 LAB — T4, FREE: FREE T4: 0.77 ng/dL (ref 0.61–1.12)

## 2017-05-17 LAB — CBC
HCT: 41.1 % (ref 39.0–52.0)
HEMOGLOBIN: 14.2 g/dL (ref 13.0–17.0)
MCH: 31.5 pg (ref 26.0–34.0)
MCHC: 34.5 g/dL (ref 30.0–36.0)
MCV: 91.1 fL (ref 78.0–100.0)
PLATELETS: 253 10*3/uL (ref 150–400)
RBC: 4.51 MIL/uL (ref 4.22–5.81)
RDW: 12.9 % (ref 11.5–15.5)
WBC: 7.5 10*3/uL (ref 4.0–10.5)

## 2017-05-17 LAB — HEPARIN LEVEL (UNFRACTIONATED): HEPARIN UNFRACTIONATED: 0.59 [IU]/mL (ref 0.30–0.70)

## 2017-05-17 NOTE — Progress Notes (Signed)
PROGRESS NOTE  Jason Lewis JME:[id number] DOB: 1951-07-01 DOA: 05/12/2017 PCP: Deland Pretty, MD   LOS: 4 days   Brief Narrative / Interim history: 66 yo M with no sig PMHx presents with intermittent progressive CP found to have NSTEMI.  Cath on 10/16 showed severe 3V disease and multiple branch point stenoses with some residual RCA thrombus. Thoracic surgery consulted and plans are in place for patient to undergo CABG on Monday and meanwhile to be maintained on IV heparin  Assessment & Plan: Principal Problem:   Chest pain Active Problems:   Coronary artery disease   Non-ST elevation (NSTEMI) myocardial infarction [facility])   NSTEMI (non-ST elevated myocardial infarction) ([place])   NSTEMI / CAD -cardiology and TCV following, patient to have CABG Monday -continue heparin infusion, continue beta-blockers, continue atorvastatin. No bleeding   Elevated TSH (at 9) -Patient asymptomatic, suspect subclinical hypothyroidism, T4 normal, recommend recheck thyroid function tests in 3-4 weeks   DVT prophylaxis: heparin infusion Code Status: Full code Family Communication: no family cta bedside Disposition Plan: home  Consultants:   Cardiology  TCV  Procedures:   LHC 10/16  Mid RCA lesion, 60 %stenosed.   Non-ST elevation myocardial infarction presentation without definite culprit identified.  Widely patent left main.  Heavily calcified and diffusely diseased mid LAD and second diagonal containing 80 and 70% stenoses respectively, forming a Medina 0, 1,1 bifurcation stenosis. A small third diagonal contains 80%proximal narrowing.  Significant circumflex disease with 90-95% stenosis in the moderate sized first obtuse marginal and 70% obstruction in the continuation of the circumflex beyond the origin of the 1st marginal branch.  The RCA contains mid-vessel intraluminal thrombus with obstruction up to 50% and mid PDA 75% obstruction. Distal left ventricular branch 60-70%  narrowing.  Overall normal LV function. EF 60%. LVEDP is normal, with no evidence of diastolic heart failure.  RECOMMENDATIONS:   Heart team approach: Unclear culprit for presentation, likely the mid RCA which contains thrombus and </= 50% stenosis and otherwise has TIMI-3 flow. The other possibility is the first obtuse marginal branch which is part of a relatively complex bifurcation stenosis involving the circumflex. Additionally, there is high-grade calcified stenosis in the mid LAD and diagonal #2 territory. When all is considered, surgical therapy may be his best option. Further discussion needed. Each PCI option is relatively complex.   Echocardiogram 10/16 Study Conclusions - Left ventricle: The cavity size was normal. Wall thickness wasnormal. Systolic function was normal. The estimated ejectionfraction was in the range of 55% to 60%. Wall motion was normal;there were no regional wall motion abnormalities. Features areconsistent with a pseudonormal left ventricular filling pattern,with concomitant abnormal relaxation and increased fillingpressure (grade 2 diastolic dysfunction). - Aortic valve: There was no stenosis. - Mitral valve: There was trivial regurgitation. - Right ventricle: The cavity size was normal. Systolic functionwas normal. - Tricuspid valve: Peak RV-RA gradient (S): 36 mm Hg. - Pulmonary arteries: PA peak pressure: 39 mm Hg (S). - Inferior vena cava: The vessel was normal in size. Therespirophasic diameter changes were in the normal range (>= 50%),consistent with normal central venous pressure.  Impressions:  - Normal LV size and systolic function, EF 22-97%. Moderatediastolic dysfunction. Normal RV size and systolic function. Mildpulmonary hypertension.  Antimicrobials:  None    Subjective: -feels well, awaiting CABG  Objective: Vitals:   05/16/17 2200 05/17/17 0447 05/17/17 0835 05/17/17 0937  BP:  115/65 113/67 113/67  Pulse: (!) 53 (!)  50 (!) 50 (!) 59  Resp:  16  Temp:  97.9 F (36.6 C) 98.6 F (37 C)   TempSrc:  Oral Oral   SpO2:  94% 97%   Weight:  81.6 kg (179 lb 12.8 oz)    Height:        Intake/Output Summary (Last 24 hours) at 05/17/17 1353 Last data filed at 05/17/17 0856  Gross per 24 hour  Intake              403 ml  Output              400 ml  Net                3 ml   Filed Weights   05/15/17 0443 05/16/17 0456 05/17/17 0447  Weight: 82 kg (180 lb 11.2 oz) 82.1 kg (181 lb) 81.6 kg (179 lb 12.8 oz)    Examination:  Constitutional: NAD Respiratory: CTA Cardiovascular: RRR   Data Reviewed: I have independently reviewed following labs and imaging studies   CBC:  Recent Labs Lab 05/12/17 1127 05/14/17 0438 05/15/17 0246 05/16/17 0732 05/17/17 0351  WBC 6.6 6.6 8.9 7.9 7.5  HGB 14.7 13.2 13.9 14.6 14.2  HCT 44.0 39.9 40.6 44.1 41.1  MCV 90.3 91.1 90.2 91.1 91.1  PLT 308 281 260 299 010   Basic Metabolic Panel:  Recent Labs Lab 05/12/17 1127 05/15/17 1026  NA 140 137  K 3.8 3.9  CL 105 104  CO2 25 25  GLUCOSE 99 105*  BUN 12 11  CREATININE 1.09 0.98  CALCIUM 9.1 9.2   GFR: Estimated Creatinine Clearance: 70.8 mL/min (by C-G formula based on SCr of 0.98 mg/dL). Liver Function Tests: No results for input(s): AST, ALT, ALKPHOS, BILITOT, PROT, ALBUMIN in the last 168 hours. No results for input(s): LIPASE, AMYLASE in the last 168 hours. No results for input(s): AMMONIA in the last 168 hours. Coagulation Profile:  Recent Labs Lab 05/13/17 1034 05/16/17 0732  INR 1.17 0.97   Cardiac Enzymes:  Recent Labs Lab 05/12/17 1546 05/12/17 1832 05/13/17 0044 05/13/17 0353 05/13/17 0709  TROPONINI 0.03* 0.07* 0.21* 0.16* 0.11*   BNP (last 3 results) No results for input(s): PROBNP in the last 8760 hours. HbA1C: No results for input(s): HGBA1C in the last 72 hours. CBG: No results for input(s): GLUCAP in the last 168 hours. Lipid Profile: No results for input(s):  CHOL, HDL, LDLCALC, TRIG, CHOLHDL, LDLDIRECT in the last 72 hours. Thyroid Function Tests:  Recent Labs  05/16/17 0732 05/17/17 0351  TSH 9.087*  --   FREET4  --  0.77   Anemia Panel: No results for input(s): VITAMINB12, FOLATE, FERRITIN, TIBC, IRON, RETICCTPCT in the last 72 hours. Urine analysis:    Component Value Date/Time   COLORURINE YELLOW 05/15/2017 1923   APPEARANCEUR CLEAR 05/15/2017 1923   LABSPEC 1.014 05/15/2017 [place] 5.0 05/15/2017 [place] NEGATIVE 05/15/2017 [place] NEGATIVE 05/15/2017 [place] NEGATIVE 05/15/2017 [place] NEGATIVE 05/15/2017 [place] NEGATIVE 05/15/2017 1923   NITRITE NEGATIVE 05/15/2017 1923   LEUKOCYTESUR NEGATIVE 05/15/2017 1923   Sepsis Labs: Invalid input(s): PROCALCITONIN, LACTICIDVEN  Recent Results (from the past 240 hour(s))  Surgical pcr screen     Status: None   Collection Time: 05/15/17  5:46 PM  Result Value Ref Range Status   MRSA, PCR NEGATIVE NEGATIVE Final   Staphylococcus aureus NEGATIVE NEGATIVE Final    Comment: (NOTE) The Xpert SA Assay (FDA approved for NASAL  specimens in patients 75 years of age and older), is one component of a comprehensive surveillance program. It is not intended to diagnose infection nor to guide or monitor treatment.       Radiology Studies: No results found.   Scheduled Meds: . aspirin  81 mg Oral Daily  . atorvastatin  80 mg Oral q1800  . metoprolol tartrate  12.5 mg Oral BID  . sodium chloride flush  3 mL Intravenous Q12H   Continuous Infusions: . sodium chloride    . heparin 1,250 Units/hr (05/17/17 0227)     Marzetta Board, MD, PhD [facility] Pager [phone]  If 7PM-7AM, please contact night-coverage [email or web address] Password [email or web address] 05/17/2017, 1:53 PM

## 2017-05-17 NOTE — Progress Notes (Signed)
ANTICOAGULATION CONSULT NOTE - Follow Up Consult  Pharmacy Consult for Heparin Indication: Multi-vessel CAD plan CABG 10/22  No Known Allergies  Patient Measurements: Height: 5\' 8"  (172.7 cm) Weight: 179 lb 12.8 oz (81.6 kg) IBW/kg (Calculated) : 68.4 Heparin Dosing Weight: 82 kg  Vital Signs: Temp: 98.6 F (37 C) (10/20 0835) Temp Source: Oral (10/20 0835) BP: 113/67 (10/20 0937) Pulse Rate: 59 (10/20 0937)  Labs:  Recent Labs  05/15/17 0246 05/15/17 1026 05/16/17 0728 05/16/17 0732 05/17/17 0351  HGB 13.9  --   --  14.6 14.2  HCT 40.6  --   --  44.1 41.1  PLT 260  --   --  299 253  LABPROT  --   --   --  12.8  --   INR  --   --   --  0.97  --   HEPARINUNFRC 0.59  --  0.48  --  0.59  CREATININE  --  0.98  --   --   --     Estimated Creatinine Clearance: 70.8 mL/min (by C-G formula based on SCr of 0.98 mg/dL).   Assessment:  Anticoag: CP. NSTEMI by troponin. No bolus in setting of recent cath 10/16. Resumed post-cath.  CBC remains WNL.HL 0.59 at goal on heparin drip rate 1250 uts/hr. CVTS consulted - plan CABG 10/22.  Meds ok   Goal of Therapy:  Heparin level 0.3-0.7 units/ml Monitor platelets by anticoagulation protocol: Yes   Plan:  Heparin 1250 units/hr Daily HL and CBC  Bonnita Nasuti Pharm.D. CPP, BCPS Clinical Pharmacist [phone] 05/17/2017 1:08 PM

## 2017-05-17 NOTE — Progress Notes (Signed)
Progress Note  Patient Name: Jason Lewis Date of Encounter: 05/17/2017  Primary Cardiologist: Dr. Fransico Him  Subjective   No chest pain or shortness of breath at rest. No abdominal pain or nausea.  Inpatient Medications    Scheduled Meds: . aspirin  81 mg Oral Daily  . atorvastatin  80 mg Oral q1800  . metoprolol tartrate  12.5 mg Oral BID  . sodium chloride flush  3 mL Intravenous Q12H   Continuous Infusions: . sodium chloride    . heparin 1,250 Units/hr (05/17/17 0227)   PRN Meds: sodium chloride, acetaminophen, ondansetron (ZOFRAN) IV, oxyCODONE, sodium chloride flush   Vital Signs    Vitals:   05/16/17 1422 05/16/17 2025 05/16/17 2200 05/17/17 0447  BP:  118/78  115/65  Pulse:  (!) 57 (!) 53 (!) 50  Resp:  18  16  Temp: 99.2 F (37.3 C) 97.6 F (36.4 C)  97.9 F (36.6 C)  TempSrc: Oral Oral  Oral  SpO2: 98% 99%  94%  Weight:    179 lb 12.8 oz (81.6 kg)  Height:        Intake/Output Summary (Last 24 hours) at 05/17/17 0800 Last data filed at 05/17/17 0600  Gross per 24 hour  Intake             1125 ml  Output              900 ml  Net              225 ml   Filed Weights   05/15/17 0443 05/16/17 0456 05/17/17 0447  Weight: 180 lb 11.2 oz (82 kg) 181 lb (82.1 kg) 179 lb 12.8 oz (81.6 kg)    Telemetry    Sinus rhythm. Personally reviewed.  ECG    Tracing from 05/12/2017 shows normal sinus rhythm. Personally reviewed.  Physical Exam   GEN: No acute distress.   Neck: No JVD. Cardiac: RRR, no murmur, rub, or gallop.  Respiratory: Nonlabored. Clear to auscultation bilaterally. GI: Soft, nontender, bowel sounds present. MS: No edema; No deformity. Neuro:  Nonfocal. Psych: Alert and oriented x 3. Normal affect.  Labs    Chemistry Recent Labs Lab 05/12/17 1127 05/15/17 1026  NA 140 137  K 3.8 3.9  CL 105 104  CO2 25 25  GLUCOSE 99 105*  BUN 12 11  CREATININE 1.09 0.98  CALCIUM 9.1 9.2  GFRNONAA >60 >60  GFRAA >60 >60    ANIONGAP 10 8     Hematology Recent Labs Lab 05/15/17 0246 05/16/17 0732 05/17/17 0351  WBC 8.9 7.9 7.5  RBC 4.50 4.84 4.51  HGB 13.9 14.6 14.2  HCT 40.6 44.1 41.1  MCV 90.2 91.1 91.1  MCH 30.9 30.2 31.5  MCHC 34.2 33.1 34.5  RDW 12.5 12.8 12.9  PLT 260 299 253    Cardiac Enzymes Recent Labs Lab 05/12/17 1832 05/13/17 0044 05/13/17 0353 05/13/17 0709  TROPONINI 0.07* 0.21* 0.16* 0.11*   No results for input(s): TROPIPOC in the last 168 hours.    Radiology    No results found.  Cardiac Studies   Cardiac catheterization 05/13/2017:  Mid RCA lesion, 60 %stenosed.    Non-ST elevation myocardial infarction presentation without definite culprit identified.  Widely patent left main.  Heavily calcified and diffusely diseased mid LAD and second diagonal containing 80 and 70% stenoses respectively, forming a Medina 0, 1,1 bifurcation stenosis. A small third diagonal contains 80%proximal narrowing.  Significant circumflex disease with 90-95% stenosis  in the moderate sized first obtuse marginal and 70% obstruction in the continuation of the circumflex beyond the origin of the 1st marginal branch.  The RCA contains mid-vessel intraluminal thrombus with obstruction up to 50% and mid PDA 75% obstruction. Distal left ventricular branch 60-70% narrowing.  Overall normal LV function. EF 60%. LVEDP is normal, with no evidence of diastolic heart failure.  Echocardiogram 05/13/2017: Study Conclusions  - Left ventricle: The cavity size was normal. Wall thickness was   normal. Systolic function was normal. The estimated ejection   fraction was in the range of 55% to 60%. Wall motion was normal;   there were no regional wall motion abnormalities. Features are   consistent with a pseudonormal left ventricular filling pattern,   with concomitant abnormal relaxation and increased filling   pressure (grade 2 diastolic dysfunction). - Aortic valve: There was no stenosis. -  Mitral valve: There was trivial regurgitation. - Right ventricle: The cavity size was normal. Systolic function   was normal. - Tricuspid valve: Peak RV-RA gradient (S): 36 mm Hg. - Pulmonary arteries: PA peak pressure: 39 mm Hg (S). - Inferior vena cava: The vessel was normal in size. The   respirophasic diameter changes were in the normal range (>= 50%),   consistent with normal central venous pressure.  Impressions:  - Normal LV size and systolic function, EF 79-02%. Moderate   diastolic dysfunction. Normal RV size and systolic function. Mild   pulmonary hypertension.  Patient Profile     66 y.o. male with recently documented hypertension and hyperlipidemia, presenting with NSTEMI and diagnosis of multivessel CAD.  Assessment & Plan    1. NSTEMI, peak troponin I 0.21. No active chest pain at this time.  2. Multivessel CAD with plan for CABG on Monday. Patient has been evaluated by Dr. Prescott Gum. He continues on heparin with thrombotic RCA.  3. Essential hypertension, currently on Lopressor.blood pressure adequately controlled.  4. Hyperlipidemia with LDL 141. Tolerating high-dose Lipitor.  No change in current regimen. Plan to continue heparin through the weekend for treatment of thrombotic RCA. Otherwise continue aspirin, Lopressor, and Lipitor.  Signed, Rozann Lesches, MD  05/17/2017, 8:00 AM

## 2017-05-18 ENCOUNTER — Inpatient Hospital Stay (HOSPITAL_COMMUNITY): Payer: PPO

## 2017-05-18 LAB — VAS US DOPPLER PRE CABG
LEFT ECA DIAS: -17 cm/s
LEFT VERTEBRAL DIAS: 27 cm/s
Left CCA dist dias: -26 cm/s
Left CCA dist sys: -128 cm/s
Left CCA prox dias: 29 cm/s
Left CCA prox sys: 128 cm/s
Left ICA dist dias: -32 cm/s
Left ICA dist sys: -91 cm/s
Left ICA prox dias: 21 cm/s
Left ICA prox sys: 69 cm/s
RIGHT ECA DIAS: -16 cm/s
RIGHT VERTEBRAL DIAS: 16 cm/s
Right CCA prox dias: 18 cm/s
Right CCA prox sys: 90 cm/s
Right cca dist sys: -61 cm/s

## 2017-05-18 LAB — BLOOD GAS, ARTERIAL
Acid-Base Excess: 2.3 mmol/L — ABNORMAL HIGH (ref 0.0–2.0)
Bicarbonate: 26.5 mmol/L (ref 20.0–28.0)
Drawn by: 10552
FIO2: 21
O2 Saturation: 94.1 %
Patient temperature: 98.6
pCO2 arterial: 41.9 mmHg (ref 32.0–48.0)
pH, Arterial: 7.417 (ref 7.350–7.450)
pO2, Arterial: 74.6 mmHg — ABNORMAL LOW (ref 83.0–108.0)

## 2017-05-18 LAB — BASIC METABOLIC PANEL
ANION GAP: 5 (ref 5–15)
BUN: 14 mg/dL (ref 6–20)
CALCIUM: 9.2 mg/dL (ref 8.9–10.3)
CO2: 30 mmol/L (ref 22–32)
Chloride: 103 mmol/L (ref 101–111)
Creatinine, Ser: 1.12 mg/dL (ref 0.61–1.24)
GFR calc Af Amer: >60 mL/min (ref >60)
GLUCOSE: 99 mg/dL (ref 65–99)
Potassium: 4.3 mmol/L (ref 3.5–5.1)
Sodium: 138 mmol/L (ref 135–145)

## 2017-05-18 LAB — CBC
HCT: 41.2 % (ref 39.0–52.0)
Hemoglobin: 14.1 g/dL (ref 13.0–17.0)
MCH: 31.3 pg (ref 26.0–34.0)
MCHC: 34.2 g/dL (ref 30.0–36.0)
MCV: 91.4 fL (ref 78.0–100.0)
PLATELETS: 235 10*3/uL (ref 150–400)
RBC: 4.51 MIL/uL (ref 4.22–5.81)
RDW: 12.6 % (ref 11.5–15.5)
WBC: 8.7 10*3/uL (ref 4.0–10.5)

## 2017-05-18 LAB — COMPREHENSIVE METABOLIC PANEL
ALT: 102 U/L — ABNORMAL HIGH (ref 17–63)
AST: 77 U/L — ABNORMAL HIGH (ref 15–41)
Albumin: 4.1 g/dL (ref 3.5–5.0)
Alkaline Phosphatase: 74 U/L (ref 38–126)
Anion gap: 8 (ref 5–15)
BUN: 16 mg/dL (ref 6–20)
CO2: 28 mmol/L (ref 22–32)
Calcium: 9.3 mg/dL (ref 8.9–10.3)
Chloride: 103 mmol/L (ref 101–111)
Creatinine, Ser: 1.19 mg/dL (ref 0.61–1.24)
GFR calc Af Amer: >60 mL/min (ref >60)
GFR calc non Af Amer: >60 mL/min (ref >60)
Glucose, Bld: 83 mg/dL (ref 65–99)
Potassium: 3.8 mmol/L (ref 3.5–5.1)
Sodium: 139 mmol/L (ref 135–145)
Total Bilirubin: 0.9 mg/dL (ref 0.3–1.2)
Total Protein: 6.6 g/dL (ref 6.5–8.1)

## 2017-05-18 LAB — ABO/RH: ABO/RH(D): O POS

## 2017-05-18 LAB — HEMOGLOBIN A1C
Hgb A1c MFr Bld: 5.7 % — ABNORMAL HIGH (ref 4.8–5.6)
Mean Plasma Glucose: 116.89 mg/dL

## 2017-05-18 LAB — T3, FREE: T3 FREE: 3.1 pg/mL (ref 2.0–4.4)

## 2017-05-18 LAB — HEPARIN LEVEL (UNFRACTIONATED): Heparin Unfractionated: 0.59 IU/mL (ref 0.30–0.70)

## 2017-05-18 LAB — PREPARE RBC (CROSSMATCH)

## 2017-05-18 LAB — APTT: aPTT: 85 seconds — ABNORMAL HIGH (ref 24–36)

## 2017-05-18 MED ORDER — METOPROLOL TARTRATE 12.5 MG HALF TABLET
12.5000 mg | ORAL_TABLET | Freq: Once | ORAL | Status: AC
  Administered 2017-05-19: 12.5 mg via ORAL
  Filled 2017-05-18: qty 1

## 2017-05-18 MED ORDER — NITROGLYCERIN IN D5W 200-5 MCG/ML-% IV SOLN
2.0000 ug/min | INTRAVENOUS | Status: DC
  Filled 2017-05-18: qty 250

## 2017-05-18 MED ORDER — PLASMA-LYTE 148 IV SOLN
INTRAVENOUS | Status: AC
  Administered 2017-05-19: 500 mL
  Filled 2017-05-18: qty 2.5

## 2017-05-18 MED ORDER — DEXMEDETOMIDINE HCL IN NACL 400 MCG/100ML IV SOLN
0.1000 ug/kg/h | INTRAVENOUS | Status: AC
  Administered 2017-05-19: .5 ug/kg/h via INTRAVENOUS
  Filled 2017-05-18: qty 100

## 2017-05-18 MED ORDER — ALPRAZOLAM 0.25 MG PO TABS: 0.2500 mg | ORAL_TABLET | ORAL | Status: DC | PRN

## 2017-05-18 MED ORDER — BISACODYL 5 MG PO TBEC
5.0000 mg | DELAYED_RELEASE_TABLET | Freq: Once | ORAL | Status: AC
  Administered 2017-05-18: 5 mg via ORAL
  Filled 2017-05-18: qty 1

## 2017-05-18 MED ORDER — DEXTROSE 5 % IV SOLN
1.5000 g | INTRAVENOUS | Status: AC
  Administered 2017-05-19: 1.5 g via INTRAVENOUS
  Administered 2017-05-19: .75 g via INTRAVENOUS
  Filled 2017-05-18: qty 1.5

## 2017-05-18 MED ORDER — TRANEXAMIC ACID (OHS) PUMP PRIME SOLUTION
2.0000 mg/kg | INTRAVENOUS | Status: DC
  Filled 2017-05-18: qty 1.62

## 2017-05-18 MED ORDER — TEMAZEPAM 15 MG PO CAPS: 15.0000 mg | ORAL_CAPSULE | Freq: Once | ORAL | Status: DC | PRN

## 2017-05-18 MED ORDER — SODIUM CHLORIDE 0.9 % IV SOLN
INTRAVENOUS | Status: DC
  Filled 2017-05-18: qty 30

## 2017-05-18 MED ORDER — CHLORHEXIDINE GLUCONATE 0.12 % MT SOLN
15.0000 mL | Freq: Once | OROMUCOSAL | Status: AC
  Administered 2017-05-19: 15 mL via OROMUCOSAL
  Filled 2017-05-18: qty 15

## 2017-05-18 MED ORDER — INSULIN REGULAR HUMAN 100 UNIT/ML IJ SOLN
INTRAMUSCULAR | Status: AC
  Administered 2017-05-19: 1.6 [IU]/h via INTRAVENOUS
  Filled 2017-05-18: qty 1

## 2017-05-18 MED ORDER — SODIUM CHLORIDE 0.9 % IV SOLN
30.0000 ug/min | INTRAVENOUS | Status: AC
  Administered 2017-05-19: 20 ug/min via INTRAVENOUS
  Filled 2017-05-18: qty 2

## 2017-05-18 MED ORDER — DEXTROSE 5 % IV SOLN
750.0000 mg | INTRAVENOUS | Status: DC
  Filled 2017-05-18: qty 750

## 2017-05-18 MED ORDER — CHLORHEXIDINE GLUCONATE 4 % EX LIQD
60.0000 mL | Freq: Once | CUTANEOUS | Status: AC
  Administered 2017-05-19: 4 via TOPICAL
  Filled 2017-05-18: qty 60

## 2017-05-18 MED ORDER — MAGNESIUM SULFATE 50 % IJ SOLN
40.0000 meq | INTRAMUSCULAR | Status: DC
  Filled 2017-05-18: qty 10

## 2017-05-18 MED ORDER — POTASSIUM CHLORIDE 2 MEQ/ML IV SOLN
80.0000 meq | INTRAVENOUS | Status: DC
  Filled 2017-05-18: qty 40

## 2017-05-18 MED ORDER — TRANEXAMIC ACID (OHS) BOLUS VIA INFUSION
15.0000 mg/kg | INTRAVENOUS | Status: AC
  Administered 2017-05-19: 15 mg via INTRAVENOUS
  Filled 2017-05-18: qty 1217

## 2017-05-18 MED ORDER — EPINEPHRINE PF 1 MG/ML IJ SOLN
0.0000 ug/min | INTRAMUSCULAR | Status: DC
  Filled 2017-05-18: qty 4

## 2017-05-18 MED ORDER — DOPAMINE-DEXTROSE 3.2-5 MG/ML-% IV SOLN
0.0000 ug/kg/min | INTRAVENOUS | Status: AC
  Administered 2017-05-19: 2.5 ug/kg/min via INTRAVENOUS
  Filled 2017-05-18: qty 250

## 2017-05-18 MED ORDER — VANCOMYCIN HCL 10 G IV SOLR
1250.0000 mg | INTRAVENOUS | Status: AC
  Administered 2017-05-19: 1250 mg via INTRAVENOUS
  Filled 2017-05-18: qty 1250

## 2017-05-18 MED ORDER — CHLORHEXIDINE GLUCONATE 4 % EX LIQD
60.0000 mL | Freq: Once | CUTANEOUS | Status: AC
  Administered 2017-05-18: 4 via TOPICAL
  Filled 2017-05-18: qty 60

## 2017-05-18 MED ORDER — DIAZEPAM 5 MG PO TABS
5.0000 mg | ORAL_TABLET | Freq: Once | ORAL | Status: AC
  Administered 2017-05-19: 5 mg via ORAL
  Filled 2017-05-18: qty 1

## 2017-05-18 MED ORDER — TRANEXAMIC ACID 1000 MG/10ML IV SOLN
1.5000 mg/kg/h | INTRAVENOUS | Status: AC
  Administered 2017-05-19: 1.5 mg/kg/h via INTRAVENOUS
  Filled 2017-05-18: qty 25

## 2017-05-18 NOTE — Anesthesia Preprocedure Evaluation (Addendum)
Anesthesia Evaluation  Patient identified by MRN, date of birth, ID band Patient awake    Reviewed: Allergy & Precautions, NPO status , Patient's Chart, lab work & pertinent test results, reviewed documented beta blocker date and time   Airway Mallampati: II  TM Distance: >3 FB Neck ROM: Full    Dental  (+) Dental Advisory Given, Edentulous Upper   Pulmonary neg pulmonary ROS,    Pulmonary exam normal breath sounds clear to auscultation       Cardiovascular + CAD and + Past MI  Normal cardiovascular exam Rhythm:Regular Rate:Normal  Echo 04/2017  - Left ventricle: The cavity size was normal. Wall thickness was   normal. Systolic function was normal. The estimated ejection   fraction was in the range of 55% to 60%. Wall motion was normal;   there were no regional wall motion abnormalities. Features are   consistent with a pseudonormal left ventricular filling pattern,   with concomitant abnormal relaxation and increased filling   pressure (grade 2 diastolic dysfunction). - Aortic valve: There was no stenosis. - Mitral valve: There was trivial regurgitation. - Right ventricle: The cavity size was normal. Systolic function   was normal. - Tricuspid valve: Peak RV-RA gradient (S): 36 mm Hg. - Pulmonary arteries: PA peak pressure: 39 mm Hg (S). - Inferior vena cava: The vessel was normal in size. The   respirophasic diameter changes were in the normal range (>= 50%),   consistent with normal central venous pressure.  Impressions:  - Normal LV size and systolic function, EF 69-67%. Moderate   diastolic dysfunction. Normal RV size and systolic function. Mild   pulmonary hypertension.    Neuro/Psych negative neurological ROS  negative psych ROS   GI/Hepatic negative GI ROS, Neg liver ROS,   Endo/Other  negative endocrine ROS  Renal/GU negative Renal ROS  negative genitourinary   Musculoskeletal negative  musculoskeletal ROS (+)   Abdominal   Peds negative pediatric ROS (+)  Hematology negative hematology ROS (+)   Anesthesia Other Findings   Reproductive/Obstetrics negative OB ROS                           Anesthesia Physical Anesthesia Plan  ASA: IV  Anesthesia Plan: General   Post-op Pain Management:    Induction: Intravenous  PONV Risk Score and Plan: 3 and Midazolam and Treatment may vary due to age or medical condition  Airway Management Planned: Oral ETT  Additional Equipment: Arterial line, CVP, PA Cath, Ultrasound Guidance Line Placement and TEE  Intra-op Plan:   Post-operative Plan: Post-operative intubation/ventilation  Informed Consent: I have reviewed the patients History and Physical, chart, labs and discussed the procedure including the risks, benefits and alternatives for the proposed anesthesia with the patient or authorized representative who has indicated his/her understanding and acceptance.   Dental advisory given  Plan Discussed with: CRNA  Anesthesia Plan Comments:        Anesthesia Quick Evaluation

## 2017-05-18 NOTE — Progress Notes (Signed)
ANTICOAGULATION CONSULT NOTE - Follow Up Consult  Pharmacy Consult for Heparin Indication: Multi-vessel CAD plan CABG 10/22  No Known Allergies  Patient Measurements: Height: 5\' 8"  (172.7 cm) Weight: 178 lb 11.2 oz (81.1 kg) IBW/kg (Calculated) : 68.4 Heparin Dosing Weight: 82 kg  Vital Signs: Temp: 97.8 F (36.6 C) (10/21 0300) Temp Source: Oral (10/21 0300) BP: 116/74 (10/21 0830) Pulse Rate: 67 (10/21 0830)  Labs:  Recent Labs  05/16/17 0728  05/16/17 0732 05/17/17 0351 05/18/17 0559  HGB  --   < > 14.6 14.2 14.1  HCT  --   --  44.1 41.1 41.2  PLT  --   --  299 253 235  LABPROT  --   --  12.8  --   --   INR  --   --  0.97  --   --   HEPARINUNFRC 0.48  --   --  0.59 0.59  CREATININE  --   --   --   --  1.12  < > = values in this interval not displayed.  Estimated Creatinine Clearance: 61.9 mL/min (by C-G formula based on SCr of 1.12 mg/dL).   Assessment:  Anticoag: CP. NSTEMI by troponin. No bolus in setting of recent cath 10/16. Resumed post-cath.  CBC remains WNL.HL 0.59 at goal on heparin drip rate 1250 uts/hr. CVTS consulted - plan CABG 10/22.  Meds ok asa81, atorvaststin 80, metoprolol 12.5 - SR 60, SBP 115  Goal of Therapy:  Heparin level 0.3-0.7 units/ml Monitor platelets by anticoagulation protocol: Yes   Plan:  Heparin 1250 units/hr Daily HL and CBC  Bonnita Nasuti Pharm.D. CPP, BCPS Clinical Pharmacist [phone] 05/18/2017 10:37 AM

## 2017-05-18 NOTE — Progress Notes (Signed)
Progress Note  Patient Name: Jason Lewis Date of Encounter: 05/18/2017  Primary Cardiologist: Dr. Fransico Him  Subjective   No chest pain, shortness of breath, palpitations, dizziness. Good appetite.  Inpatient Medications    Scheduled Meds: . aspirin  81 mg Oral Daily  . atorvastatin  80 mg Oral q1800  . bisacodyl  5 mg Oral Once  . chlorhexidine  60 mL Topical Once   And  . [START ON 05/19/2017] chlorhexidine  60 mL Topical Once  . [START ON 05/19/2017] chlorhexidine  15 mL Mouth/Throat Once  . [START ON 05/19/2017] diazepam  5 mg Oral Once  . metoprolol tartrate  12.5 mg Oral BID  . [START ON 05/19/2017] metoprolol tartrate  12.5 mg Oral Once  . sodium chloride flush  3 mL Intravenous Q12H   Continuous Infusions: . sodium chloride    . heparin 1,250 Units/hr (05/17/17 1905)   PRN Meds: sodium chloride, acetaminophen, ALPRAZolam, ondansetron (ZOFRAN) IV, oxyCODONE, sodium chloride flush, temazepam   Vital Signs    Vitals:   05/17/17 0937 05/17/17 2030 05/17/17 2230 05/18/17 0300  BP: 113/67 115/71 126/65 122/74  Pulse: (!) 59 (!) 59    Resp:      Temp:  98.2 F (36.8 C) 97.8 F (36.6 C) 97.8 F (36.6 C)  TempSrc:  Oral Oral Oral  SpO2:  92% 95%   Weight:    178 lb 11.2 oz (81.1 kg)  Height:        Intake/Output Summary (Last 24 hours) at 05/18/17 0827 Last data filed at 05/17/17 2230  Gross per 24 hour  Intake           654.25 ml  Output                0 ml  Net           654.25 ml   Filed Weights   05/16/17 0456 05/17/17 0447 05/18/17 0300  Weight: 181 lb (82.1 kg) 179 lb 12.8 oz (81.6 kg) 178 lb 11.2 oz (81.1 kg)    Telemetry    Sinus rhythm with occasional PACs. Personally reviewed.  Physical Exam   GEN: No acute distress.   Neck: No JVD. Cardiac: RRR, no murmur, rub, or gallop.  Respiratory: Nonlabored. Clear to auscultation bilaterally. GI: Soft, nontender, bowel sounds present. MS: No edema; No deformity. Neuro:   Nonfocal. Psych: Alert and oriented x 3. Normal affect.  Labs    Chemistry Recent Labs Lab 05/12/17 1127 05/15/17 1026 05/18/17 0559  NA 140 137 138  K 3.8 3.9 4.3  CL 105 104 103  CO2 25 25 30   GLUCOSE 99 105* 99  BUN 12 11 14   CREATININE 1.09 0.98 1.12  CALCIUM 9.1 9.2 9.2  GFRNONAA >60 >60 >60  GFRAA >60 >60 >60  ANIONGAP 10 8 5      Hematology Recent Labs Lab 05/16/17 0732 05/17/17 0351 05/18/17 0559  WBC 7.9 7.5 8.7  RBC 4.84 4.51 4.51  HGB 14.6 14.2 14.1  HCT 44.1 41.1 41.2  MCV 91.1 91.1 91.4  MCH 30.2 31.5 31.3  MCHC 33.1 34.5 34.2  RDW 12.8 12.9 12.6  PLT 299 253 235    Cardiac Enzymes Recent Labs Lab 05/12/17 1832 05/13/17 0044 05/13/17 0353 05/13/17 0709  TROPONINI 0.07* 0.21* 0.16* 0.11*   No results for input(s): TROPIPOC in the last 168 hours.   Radiology    No results found.  Cardiac Studies   Cardiac catheterization 05/13/2017:  Mid RCA  lesion, 60 %stenosed.   Non-ST elevation myocardial infarction presentation without definite culprit identified.  Widely patent left main.  Heavily calcified and diffusely diseased mid LAD and second diagonal containing 80 and 70% stenoses respectively, forming a [place] 0, 1,1 bifurcation stenosis. A small third diagonal contains 80%proximal narrowing.  Significant circumflex disease with 90-95% stenosis in the moderate sized first obtuse marginal and 70% obstruction in the continuation of the circumflex beyond the origin of the 1st marginal branch.  The RCA contains mid-vessel intraluminal thrombus with obstruction up to 50% and mid PDA 75% obstruction. Distal left ventricular branch 60-70% narrowing.  Overall normal LV function. EF 60%. LVEDP is normal, with no evidence of diastolic heart failure.  Echocardiogram 05/13/2017: Study Conclusions  - Left ventricle: The cavity size was normal. Wall thickness was normal. Systolic function was normal. The estimated ejection fraction was in  the range of 55% to 60%. Wall motion was normal; there were no regional wall motion abnormalities. Features are consistent with a pseudonormal left ventricular filling pattern, with concomitant abnormal relaxation and increased filling pressure (grade 2 diastolic dysfunction). - Aortic valve: There was no stenosis. - Mitral valve: There was trivial regurgitation. - Right ventricle: The cavity size was normal. Systolic function was normal. - Tricuspid valve: Peak RV-RA gradient (S): 36 mm Hg. - Pulmonary arteries: PA peak pressure: 39 mm Hg (S). - Inferior vena cava: The vessel was normal in size. The respirophasic diameter changes were in the normal range (>= 50%), consistent with normal central venous pressure.  Impressions:  - Normal LV size and systolic function, EF 27-25%. Moderate diastolic dysfunction. Normal RV size and systolic function. Mild pulmonary hypertension.  Patient Profile     66 y.o. male with recently documented hypertension and hyperlipidemia, presenting with NSTEMI and diagnosis of multivessel CAD.  Assessment & Plan    1. NSTEMI with peak troponin I of 0.21. Clinically stable without angina on medical therapy.  2. Multivessel CAD, on schedule for CABG on Monday morning with Dr. Prescott Gum. Patient continues on heparin infusion with thrombotic RCA.  3. Essential hypertension, blood pressure control is adequate.  4. Hyperlipidemia with LDL 141. Continues on Lipitor.  Patient on scheduled for CABG tomorrow morning. No changes made present regimen. Follow-up CBC and BMET in AM.  Signed, Rozann Lesches, MD  05/18/2017, 8:27 AM

## 2017-05-18 NOTE — Progress Notes (Signed)
PROGRESS NOTE    Jason Lewis   BOF:[id number]  DOB: 21-Jan-1951  DOA: 05/12/2017 PCP: Deland Pretty, MD   Brief Narrative:   66 yo M with no sig PMHx presents with intermittent progressive CP found to have NSTEMI. Cath on 10/16 showed severe 3V disease and multiple branch point stenoses with some residual RCA thrombus. Thoracic surgery consulted and plans are in place for patient to undergo CABG on Monday and meanwhile to be maintained on IV heparin  Subjective: ROS: no complaints of nausea, vomiting, constipation diarrhea, cough, dyspnea or dysuria. No other complaints.   Assessment & Plan:  NSTEMI / CAD -cardiology and TCV following, patient to have CABG Monday -continue heparin infusion, continue beta-blockers, continue atorvastatin. No bleeding   Elevated TSH (at 9) -Patient asymptomatic- suspect subclinical hypothyroidism - T4 normal, recommend recheck thyroid function tests in 3-4 weeks  DVT prophylaxis: heparin infusion Code Status:Full code Family Communication:  Disposition Plan: home when stable Consultants:   CT surgery  Cardiology  Procedures:  10/16 Left heart cath and   Mid RCA lesion, 60 %stenosed.   Non-ST elevation myocardial infarction presentation without definite culprit identified.  Widely patent left main.  Heavily calcified and diffusely diseased mid LAD and second diagonal containing 80 and 70% stenoses respectively, forming a [place] 0, 1,1 bifurcation stenosis. A small third diagonal contains 80%proximal narrowing.  Significant circumflex disease with 90-95% stenosis in the moderate sized first obtuse marginal and 70% obstruction in the continuation of the circumflex beyond the origin of the 1st marginal branch.  The RCA contains mid-vessel intraluminal thrombus with obstruction up to 50% and mid PDA 75% obstruction. Distal left ventricular branch 60-70% narrowing.  Overall normal LV function. EF 60%. LVEDP is normal, with no evidence of  diastolic heart failure.  2 D ECHO Normal LV size and systolic function, EF 77-82%. Moderatediastolic dysfunction. Normal RV size and systolic function. Mildpulmonary hypertension.  Antimicrobials:  Anti-infectives    Start     Dose/Rate Route Frequency Ordered Stop   05/19/17 0400  vancomycin (VANCOCIN) 1,250 mg in sodium chloride 0.9 % 250 mL IVPB     1,250 mg 166.7 mL/hr over 90 Minutes Intravenous To Surgery 05/18/17 0936 05/20/17 0400   05/19/17 0400  cefUROXime (ZINACEF) 1.5 g in dextrose 5 % 50 mL IVPB     1.5 g 100 mL/hr over 30 Minutes Intravenous To Surgery 05/18/17 0936 05/20/17 0400   05/19/17 0400  cefUROXime (ZINACEF) 750 mg in dextrose 5 % 50 mL IVPB     750 mg 100 mL/hr over 30 Minutes Intravenous To Surgery 05/18/17 0936 05/20/17 0400       Objective: Vitals:   05/17/17 2230 05/18/17 0300 05/18/17 0830 05/18/17 1340  BP: 126/65 122/74 116/74 111/62  Pulse:   67 64  Resp:      Temp: 97.8 F (36.6 C) 97.8 F (36.6 C)  98 F (36.7 C)  TempSrc: Oral Oral  Oral  SpO2: 95%   97%  Weight:  81.1 kg (178 lb 11.2 oz)    Height:        Intake/Output Summary (Last 24 hours) at 05/18/17 1554 Last data filed at 05/18/17 0800  Gross per 24 hour  Intake           716.25 ml  Output                0 ml  Net           716.25 ml   Filed  Weights   05/16/17 0456 05/17/17 0447 05/18/17 0300  Weight: 82.1 kg (181 lb) 81.6 kg (179 lb 12.8 oz) 81.1 kg (178 lb 11.2 oz)    Examination: General exam: Appears comfortable  HEENT: PERRLA, oral mucosa moist, no sclera icterus or thrush Respiratory system: Clear to auscultation. Respiratory effort normal. Cardiovascular system: S1 & S2 heard, RRR.    Gastrointestinal system: Abdomen soft, non-tender, nondistended. Normal bowel sound. No organomegaly Central nervous system: Alert and oriented. No focal neurological deficits. Extremities: No cyanosis, clubbing or edema Skin: No rashes or ulcers Psychiatry:  Mood & affect  appropriate.     Data Reviewed: I have personally reviewed following labs and imaging studies  CBC:  Recent Labs Lab 05/14/17 0438 05/15/17 0246 05/16/17 0732 05/17/17 0351 05/18/17 0559  WBC 6.6 8.9 7.9 7.5 8.7  HGB 13.2 13.9 14.6 14.2 14.1  HCT 39.9 40.6 44.1 41.1 41.2  MCV 91.1 90.2 91.1 91.1 91.4  PLT 281 260 299 253 761   Basic Metabolic Panel:  Recent Labs Lab 05/12/17 1127 05/15/17 1026 05/18/17 0559  NA 140 137 138  K 3.8 3.9 4.3  CL 105 104 103  CO2 25 25 30   GLUCOSE 99 105* 99  BUN 12 11 14   CREATININE 1.09 0.98 1.12  CALCIUM 9.1 9.2 9.2   GFR: Estimated Creatinine Clearance: 61.9 mL/min (by C-G formula based on SCr of 1.12 mg/dL). Liver Function Tests: No results for input(s): AST, ALT, ALKPHOS, BILITOT, PROT, ALBUMIN in the last 168 hours. No results for input(s): LIPASE, AMYLASE in the last 168 hours. No results for input(s): AMMONIA in the last 168 hours. Coagulation Profile:  Recent Labs Lab 05/13/17 1034 05/16/17 0732  INR 1.17 0.97   Cardiac Enzymes:  Recent Labs Lab 05/12/17 1546 05/12/17 1832 05/13/17 0044 05/13/17 0353 05/13/17 0709  TROPONINI 0.03* 0.07* 0.21* 0.16* 0.11*   BNP (last 3 results) No results for input(s): PROBNP in the last 8760 hours. HbA1C: No results for input(s): HGBA1C in the last 72 hours. CBG: No results for input(s): GLUCAP in the last 168 hours. Lipid Profile: No results for input(s): CHOL, HDL, LDLCALC, TRIG, CHOLHDL, LDLDIRECT in the last 72 hours. Thyroid Function Tests:  Recent Labs  05/16/17 0732 05/17/17 0351  TSH 9.087*  --   FREET4  --  0.77  T3FREE  --  3.1   Anemia Panel: No results for input(s): VITAMINB12, FOLATE, FERRITIN, TIBC, IRON, RETICCTPCT in the last 72 hours. Urine analysis:    Component Value Date/Time   COLORURINE YELLOW 05/15/2017 1923   APPEARANCEUR CLEAR 05/15/2017 1923   LABSPEC 1.014 05/15/2017 [place] 5.0 05/15/2017 [place] NEGATIVE  05/15/2017 [place] NEGATIVE 05/15/2017 [place] NEGATIVE 05/15/2017 [place] NEGATIVE 05/15/2017 1923   PROTEINUR NEGATIVE 05/15/2017 1923   NITRITE NEGATIVE 05/15/2017 1923   LEUKOCYTESUR NEGATIVE 05/15/2017 1923   Sepsis Labs: @LABRCNTIP (procalcitonin:4,lacticidven:4) ) Recent Results (from the past 240 hour(s))  Surgical pcr screen     Status: None   Collection Time: 05/15/17  5:46 PM  Result Value Ref Range Status   MRSA, PCR NEGATIVE NEGATIVE Final   Staphylococcus aureus NEGATIVE NEGATIVE Final    Comment: (NOTE) The Xpert SA Assay (FDA approved for NASAL specimens in patients 21 years of age and older), is one component of a comprehensive surveillance program. It is not intended to diagnose infection nor to guide or monitor treatment.  Radiology Studies: Dg Chest 2 View  Result Date: 05/18/2017 CLINICAL DATA:  Initial evaluation for myocardial infarction. EXAM: CHEST  2 VIEW COMPARISON:  Prior radiograph from 05/12/2017. FINDINGS: Transverse heart size at the upper limits of normal, stable. Mediastinal silhouette within normal limits. Aortic atherosclerosis noted. Lungs normally inflated. No focal infiltrates. No pulmonary edema or pleural effusion. No pneumothorax. No acute osseus abnormality. Degenerative changes noted about the [facility] joints bilaterally. IMPRESSION: 1. No active cardiopulmonary disease. 2. Aortic atherosclerosis. Electronically Signed   By: Jeannine Boga M.D.   On: 05/18/2017 15:34      Scheduled Meds: . aspirin  81 mg Oral Daily  . atorvastatin  80 mg Oral q1800  . chlorhexidine  60 mL Topical Once   And  . [START ON 05/19/2017] chlorhexidine  60 mL Topical Once  . [START ON 05/19/2017] chlorhexidine  15 mL Mouth/Throat Once  . [START ON 05/19/2017] diazepam  5 mg Oral Once  . [START ON 05/19/2017] heparin-papaverine-plasmalyte irrigation   Irrigation To OR  . [START ON 05/19/2017] magnesium sulfate  40 mEq  Other To OR  . metoprolol tartrate  12.5 mg Oral BID  . [START ON 05/19/2017] metoprolol tartrate  12.5 mg Oral Once  . [START ON 05/19/2017] potassium chloride  80 mEq Other To OR  . sodium chloride flush  3 mL Intravenous Q12H  . [START ON 05/19/2017] tranexamic acid  15 mg/kg Intravenous To OR  . [START ON 05/19/2017] tranexamic acid  2 mg/kg Intracatheter To OR   Continuous Infusions: . sodium chloride    . [START ON 05/19/2017] cefUROXime (ZINACEF)  IV    . [START ON 05/19/2017] cefUROXime (ZINACEF)  IV    . [START ON 05/19/2017] dexmedetomidine    . [START ON 05/19/2017] DOPamine    . [START ON 05/19/2017] epinephrine    . [START ON 05/19/2017] heparin 30,000 units/NS 1000 mL solution for CELLSAVER    . heparin 1,250 Units/hr (05/17/17 1905)  . [START ON 05/19/2017] insulin (NOVOLIN-R) infusion    . [START ON 05/19/2017] nitroGLYCERIN    . [START ON 05/19/2017] phenylephrine 20mg /228mL NS (0.08mg /ml) infusion    . [START ON 05/19/2017] tranexamic acid (CYKLOKAPRON) infusion ([facility])    . [START ON 05/19/2017] vancomycin       LOS: 5 days    Time spent in minutes: 35    Debbe Odea, MD [facility] Pager: [email or web address] Password [email or web address] 05/18/2017, 3:54 PM

## 2017-05-19 ENCOUNTER — Inpatient Hospital Stay (HOSPITAL_COMMUNITY): Payer: PPO

## 2017-05-19 ENCOUNTER — Encounter (HOSPITAL_COMMUNITY): Payer: Self-pay | Admitting: Certified Registered Nurse Anesthetist

## 2017-05-19 ENCOUNTER — Inpatient Hospital Stay (HOSPITAL_COMMUNITY)
Admission: EM | Disposition: A | Discharge status: Home / Self Care | Payer: Self-pay | Source: Home / Self Care | Attending: Cardiothoracic Surgery

## 2017-05-19 DIAGNOSIS — I25110 Atherosclerotic heart disease of native coronary artery with unstable angina pectoris: Secondary | ICD-10-CM

## 2017-05-19 DIAGNOSIS — Z951 Presence of aortocoronary bypass graft: Secondary | ICD-10-CM

## 2017-05-19 DIAGNOSIS — I200 Unstable angina: Secondary | ICD-10-CM

## 2017-05-19 DIAGNOSIS — I214 Non-ST elevation (NSTEMI) myocardial infarction: Secondary | ICD-10-CM

## 2017-05-19 HISTORY — PX: TEE WITHOUT CARDIOVERSION: SHX5443

## 2017-05-19 HISTORY — DX: Presence of aortocoronary bypass graft: Z95.1

## 2017-05-19 HISTORY — PX: CORONARY ARTERY BYPASS GRAFT: SHX141

## 2017-05-19 LAB — POCT I-STAT, CHEM 8
BUN: 18 mg/dL (ref 6–20)
BUN: 15 mg/dL (ref 6–20)
BUN: 17 mg/dL (ref 6–20)
BUN: 16 mg/dL (ref 6–20)
BUN: 16 mg/dL (ref 6–20)
BUN: 16 mg/dL (ref 6–20)
BUN: 15 mg/dL (ref 6–20)
BUN: 15 mg/dL (ref 6–20)
CALCIUM ION: 1.22 mmol/L (ref 1.15–1.40)
CALCIUM ION: 1.04 mmol/L — AB (ref 1.15–1.40)
CALCIUM ION: 1.14 mmol/L — AB (ref 1.15–1.40)
CHLORIDE: 104 mmol/L (ref 101–111)
CHLORIDE: 99 mmol/L — AB (ref 101–111)
CHLORIDE: 98 mmol/L — AB (ref 101–111)
CHLORIDE: 99 mmol/L — AB (ref 101–111)
CHLORIDE: 103 mmol/L (ref 101–111)
CREATININE: 0.9 mg/dL (ref 0.61–1.24)
CREATININE: 0.7 mg/dL (ref 0.61–1.24)
CREATININE: 0.9 mg/dL (ref 0.61–1.24)
CREATININE: 0.8 mg/dL (ref 0.61–1.24)
Calcium, Ion: 1.22 mmol/L (ref 1.15–1.40)
Calcium, Ion: 0.96 mmol/L — ABNORMAL LOW (ref 1.15–1.40)
Calcium, Ion: 1.08 mmol/L — ABNORMAL LOW (ref 1.15–1.40)
Calcium, Ion: 1.07 mmol/L — ABNORMAL LOW (ref 1.15–1.40)
Calcium, Ion: 1.08 mmol/L — ABNORMAL LOW (ref 1.15–1.40)
Chloride: 103 mmol/L (ref 101–111)
Chloride: 99 mmol/L — ABNORMAL LOW (ref 101–111)
Chloride: 99 mmol/L — ABNORMAL LOW (ref 101–111)
Creatinine, Ser: 0.9 mg/dL (ref 0.61–1.24)
Creatinine, Ser: 0.8 mg/dL (ref 0.61–1.24)
Creatinine, Ser: 0.9 mg/dL (ref 0.61–1.24)
Creatinine, Ser: 0.9 mg/dL (ref 0.61–1.24)
GLUCOSE: 138 mg/dL — AB (ref 65–99)
Glucose, Bld: 110 mg/dL — ABNORMAL HIGH (ref 65–99)
Glucose, Bld: 94 mg/dL (ref 65–99)
Glucose, Bld: 134 mg/dL — ABNORMAL HIGH (ref 65–99)
Glucose, Bld: 159 mg/dL — ABNORMAL HIGH (ref 65–99)
Glucose, Bld: 160 mg/dL — ABNORMAL HIGH (ref 65–99)
Glucose, Bld: 139 mg/dL — ABNORMAL HIGH (ref 65–99)
Glucose, Bld: 146 mg/dL — ABNORMAL HIGH (ref 65–99)
HEMATOCRIT: 35 % — AB (ref 39.0–52.0)
HEMATOCRIT: 26 % — AB (ref 39.0–52.0)
HEMATOCRIT: 32 % — AB (ref 39.0–52.0)
HEMATOCRIT: 27 % — AB (ref 39.0–52.0)
HEMATOCRIT: 28 % — AB (ref 39.0–52.0)
HEMATOCRIT: 29 % — AB (ref 39.0–52.0)
HEMATOCRIT: 26 % — AB (ref 39.0–52.0)
HEMATOCRIT: 26 % — AB (ref 39.0–52.0)
HEMOGLOBIN: 11.9 g/dL — AB (ref 13.0–17.0)
HEMOGLOBIN: 8.8 g/dL — AB (ref 13.0–17.0)
HEMOGLOBIN: 9.9 g/dL — AB (ref 13.0–17.0)
Hemoglobin: 10.9 g/dL — ABNORMAL LOW (ref 13.0–17.0)
Hemoglobin: 9.2 g/dL — ABNORMAL LOW (ref 13.0–17.0)
Hemoglobin: 9.5 g/dL — ABNORMAL LOW (ref 13.0–17.0)
Hemoglobin: 8.8 g/dL — ABNORMAL LOW (ref 13.0–17.0)
Hemoglobin: 8.8 g/dL — ABNORMAL LOW (ref 13.0–17.0)
POTASSIUM: 4.8 mmol/L (ref 3.5–5.1)
POTASSIUM: 5.9 mmol/L — AB (ref 3.5–5.1)
POTASSIUM: 6.3 mmol/L — AB (ref 3.5–5.1)
POTASSIUM: 4.5 mmol/L (ref 3.5–5.1)
Potassium: 4.2 mmol/L (ref 3.5–5.1)
Potassium: 4.8 mmol/L (ref 3.5–5.1)
Potassium: 5.8 mmol/L — ABNORMAL HIGH (ref 3.5–5.1)
Potassium: 4.3 mmol/L (ref 3.5–5.1)
SODIUM: 136 mmol/L (ref 135–145)
SODIUM: 137 mmol/L (ref 135–145)
SODIUM: 130 mmol/L — AB (ref 135–145)
SODIUM: 134 mmol/L — AB (ref 135–145)
SODIUM: 136 mmol/L (ref 135–145)
SODIUM: 138 mmol/L (ref 135–145)
Sodium: 137 mmol/L (ref 135–145)
Sodium: 132 mmol/L — ABNORMAL LOW (ref 135–145)
TCO2: 24 mmol/L (ref 22–32)
TCO2: 24 mmol/L (ref 22–32)
TCO2: 31 mmol/L (ref 22–32)
TCO2: 27 mmol/L (ref 22–32)
TCO2: 25 mmol/L (ref 22–32)
TCO2: 28 mmol/L (ref 22–32)
TCO2: 25 mmol/L (ref 22–32)
TCO2: 23 mmol/L (ref 22–32)

## 2017-05-19 LAB — CREATININE, SERUM
Creatinine, Ser: 1.02 mg/dL (ref 0.61–1.24)
GFR calc Af Amer: >60 mL/min (ref >60)
GFR calc non Af Amer: >60 mL/min (ref >60)

## 2017-05-19 LAB — POCT I-STAT 3, ART BLOOD GAS (G3+)
ACID-BASE DEFICIT: 2 mmol/L (ref 0.0–2.0)
ACID-BASE DEFICIT: 3 mmol/L — AB (ref 0.0–2.0)
ACID-BASE EXCESS: 4 mmol/L — AB (ref 0.0–2.0)
ACID-BASE EXCESS: 2 mmol/L (ref 0.0–2.0)
Acid-Base Excess: 2 mmol/L (ref 0.0–2.0)
BICARBONATE: 28.4 mmol/L — AB (ref 20.0–28.0)
BICARBONATE: 24.9 mmol/L (ref 20.0–28.0)
BICARBONATE: 26.7 mmol/L (ref 20.0–28.0)
BICARBONATE: 24.2 mmol/L (ref 20.0–28.0)
Bicarbonate: 26.6 mmol/L (ref 20.0–28.0)
Bicarbonate: 22.4 mmol/L (ref 20.0–28.0)
O2 SAT: 100 %
O2 SAT: 99 %
O2 SAT: 99 %
O2 Saturation: 100 %
O2 Saturation: 99 %
O2 Saturation: 97 %
PCO2 ART: 43 mmHg (ref 32.0–48.0)
PCO2 ART: 38.6 mmHg (ref 32.0–48.0)
PH ART: 7.4 (ref 7.350–7.450)
PH ART: 7.417 (ref 7.350–7.450)
PO2 ART: 213 mmHg — AB (ref 83.0–108.0)
PO2 ART: 467 mmHg — AB (ref 83.0–108.0)
PO2 ART: 122 mmHg — AB (ref 83.0–108.0)
PO2 ART: 141 mmHg — AB (ref 83.0–108.0)
PO2 ART: 128 mmHg — AB (ref 83.0–108.0)
PO2 ART: 92 mmHg (ref 83.0–108.0)
Patient temperature: 36.2
Patient temperature: 37
Patient temperature: 37.3
TCO2: 28 mmol/L (ref 22–32)
TCO2: 30 mmol/L (ref 22–32)
TCO2: 26 mmol/L (ref 22–32)
TCO2: 28 mmol/L (ref 22–32)
TCO2: 26 mmol/L (ref 22–32)
TCO2: 24 mmol/L (ref 22–32)
pCO2 arterial: 42.4 mmHg (ref 32.0–48.0)
pCO2 arterial: 38.7 mmHg (ref 32.0–48.0)
pCO2 arterial: 44.4 mmHg (ref 32.0–48.0)
pCO2 arterial: 41 mmHg (ref 32.0–48.0)
pH, Arterial: 7.434 (ref 7.350–7.450)
pH, Arterial: 7.444 (ref 7.350–7.450)
pH, Arterial: 7.344 — ABNORMAL LOW (ref 7.350–7.450)
pH, Arterial: 7.348 — ABNORMAL LOW (ref 7.350–7.450)

## 2017-05-19 LAB — BASIC METABOLIC PANEL
Anion gap: 8 (ref 5–15)
BUN: 17 mg/dL (ref 6–20)
CO2: 26 mmol/L (ref 22–32)
Calcium: 9 mg/dL (ref 8.9–10.3)
Chloride: 101 mmol/L (ref 101–111)
Creatinine, Ser: 1.13 mg/dL (ref 0.61–1.24)
GFR calc Af Amer: >60 mL/min (ref >60)
GFR calc non Af Amer: >60 mL/min (ref >60)
Glucose, Bld: 105 mg/dL — ABNORMAL HIGH (ref 65–99)
Potassium: 4 mmol/L (ref 3.5–5.1)
Sodium: 135 mmol/L (ref 135–145)

## 2017-05-19 LAB — POCT I-STAT 4, (NA,K, GLUC, HGB,HCT)
GLUCOSE: 110 mg/dL — AB (ref 65–99)
HCT: 29 % — ABNORMAL LOW (ref 39.0–52.0)
Hemoglobin: 9.9 g/dL — ABNORMAL LOW (ref 13.0–17.0)
POTASSIUM: 3.7 mmol/L (ref 3.5–5.1)
SODIUM: 140 mmol/L (ref 135–145)

## 2017-05-19 LAB — CBC
HCT: 28.1 % — ABNORMAL LOW (ref 39.0–52.0)
HEMATOCRIT: 41.8 % (ref 39.0–52.0)
HEMATOCRIT: 30.6 % — AB (ref 39.0–52.0)
Hemoglobin: 14 g/dL (ref 13.0–17.0)
Hemoglobin: 10.5 g/dL — ABNORMAL LOW (ref 13.0–17.0)
Hemoglobin: 9.6 g/dL — ABNORMAL LOW (ref 13.0–17.0)
MCH: 30.5 pg (ref 26.0–34.0)
MCH: 30.8 pg (ref 26.0–34.0)
MCH: 30.8 pg (ref 26.0–34.0)
MCHC: 33.5 g/dL (ref 30.0–36.0)
MCHC: 34.3 g/dL (ref 30.0–36.0)
MCHC: 34.2 g/dL (ref 30.0–36.0)
MCV: 91.1 fL (ref 78.0–100.0)
MCV: 89.7 fL (ref 78.0–100.0)
MCV: 90.1 fL (ref 78.0–100.0)
Platelets: 277 10*3/uL (ref 150–400)
Platelets: 159 10*3/uL (ref 150–400)
Platelets: 181 10*3/uL (ref 150–400)
RBC: 4.59 MIL/uL (ref 4.22–5.81)
RBC: 3.41 MIL/uL — ABNORMAL LOW (ref 4.22–5.81)
RBC: 3.12 MIL/uL — ABNORMAL LOW (ref 4.22–5.81)
RDW: 12.7 % (ref 11.5–15.5)
RDW: 12.4 % (ref 11.5–15.5)
RDW: 12.6 % (ref 11.5–15.5)
WBC: 8.2 10*3/uL (ref 4.0–10.5)
WBC: 11.6 10*3/uL — AB (ref 4.0–10.5)
WBC: 12.1 10*3/uL — ABNORMAL HIGH (ref 4.0–10.5)

## 2017-05-19 LAB — PROTIME-INR
INR: 1.19
Prothrombin Time: 15 seconds (ref 11.4–15.2)

## 2017-05-19 LAB — HEMOGLOBIN AND HEMATOCRIT, BLOOD
HCT: 30.6 % — ABNORMAL LOW (ref 39.0–52.0)
Hemoglobin: 10.7 g/dL — ABNORMAL LOW (ref 13.0–17.0)

## 2017-05-19 LAB — HEPARIN LEVEL (UNFRACTIONATED): Heparin Unfractionated: 0.53 IU/mL (ref 0.30–0.70)

## 2017-05-19 LAB — APTT: APTT: 28 s (ref 24–36)

## 2017-05-19 LAB — MAGNESIUM: Magnesium: 2.9 mg/dL — ABNORMAL HIGH (ref 1.7–2.4)

## 2017-05-19 LAB — GLUCOSE, CAPILLARY
GLUCOSE-CAPILLARY: 132 mg/dL — AB (ref 65–99)
GLUCOSE-CAPILLARY: 138 mg/dL — AB (ref 65–99)
GLUCOSE-CAPILLARY: 141 mg/dL — AB (ref 65–99)
Glucose-Capillary: 102 mg/dL — ABNORMAL HIGH (ref 65–99)
Glucose-Capillary: 97 mg/dL (ref 65–99)
Glucose-Capillary: 115 mg/dL — ABNORMAL HIGH (ref 65–99)
Glucose-Capillary: 110 mg/dL — ABNORMAL HIGH (ref 65–99)

## 2017-05-19 LAB — PLATELET COUNT: Platelets: 217 10*3/uL (ref 150–400)

## 2017-05-19 SURGERY — CORONARY ARTERY BYPASS GRAFTING (CABG)
Anesthesia: General | Site: Chest

## 2017-05-19 MED ORDER — ASPIRIN EC 325 MG PO TBEC: 325.0000 mg | DELAYED_RELEASE_TABLET | Freq: Every day | ORAL | Status: DC

## 2017-05-19 MED ORDER — ROCURONIUM BROMIDE 10 MG/ML (PF) SYRINGE
PREFILLED_SYRINGE | INTRAVENOUS | Status: AC
  Filled 2017-05-19: qty 5

## 2017-05-19 MED ORDER — CHLORHEXIDINE GLUCONATE 0.12 % MT SOLN
15.0000 mL | OROMUCOSAL | Status: AC
  Administered 2017-05-19: 15 mL via OROMUCOSAL

## 2017-05-19 MED ORDER — HEPARIN SODIUM (PORCINE) 1000 UNIT/ML IJ SOLN
INTRAMUSCULAR | Status: DC | PRN
  Administered 2017-05-19: 2000 [IU] via INTRAVENOUS
  Administered 2017-05-19: 22000 [IU] via INTRAVENOUS

## 2017-05-19 MED ORDER — THROMBIN (RECOMBINANT) 5000 UNITS EX SOLR
CUTANEOUS | Status: AC
  Filled 2017-05-19: qty 5000

## 2017-05-19 MED ORDER — MORPHINE SULFATE (PF) 2 MG/ML IV SOLN: 1.0000 mg | INTRAVENOUS | Status: DC | PRN

## 2017-05-19 MED ORDER — HEMOSTATIC AGENTS (NO CHARGE) OPTIME
TOPICAL | Status: DC | PRN
  Administered 2017-05-19: 1 via TOPICAL

## 2017-05-19 MED ORDER — BISACODYL 5 MG PO TBEC
10.0000 mg | DELAYED_RELEASE_TABLET | Freq: Every day | ORAL | Status: DC
  Administered 2017-05-20 – 2017-05-23 (×4): 10 mg via ORAL
  Filled 2017-05-19 (×5): qty 2

## 2017-05-19 MED ORDER — ROCURONIUM BROMIDE 10 MG/ML (PF) SYRINGE
PREFILLED_SYRINGE | INTRAVENOUS | Status: AC
  Filled 2017-05-19: qty 10

## 2017-05-19 MED ORDER — LACTATED RINGERS IV SOLN: INTRAVENOUS | Status: DC

## 2017-05-19 MED ORDER — LACTATED RINGERS IV SOLN
INTRAVENOUS | Status: DC | PRN
  Administered 2017-05-19 (×2): via INTRAVENOUS

## 2017-05-19 MED ORDER — SODIUM CHLORIDE 0.9 % IJ SOLN
INTRAMUSCULAR | Status: DC | PRN
  Administered 2017-05-19: 4 mL via TOPICAL

## 2017-05-19 MED ORDER — HEPARIN SODIUM (PORCINE) 1000 UNIT/ML IJ SOLN
INTRAMUSCULAR | Status: AC
  Filled 2017-05-19: qty 1

## 2017-05-19 MED ORDER — PANTOPRAZOLE SODIUM 40 MG PO TBEC
40.0000 mg | DELAYED_RELEASE_TABLET | Freq: Every day | ORAL | Status: DC
  Administered 2017-05-21 – 2017-05-24 (×4): 40 mg via ORAL
  Filled 2017-05-19 (×4): qty 1

## 2017-05-19 MED ORDER — DOCUSATE SODIUM 100 MG PO CAPS
200.0000 mg | ORAL_CAPSULE | Freq: Every day | ORAL | Status: DC
  Administered 2017-05-20 – 2017-05-24 (×5): 200 mg via ORAL
  Filled 2017-05-19 (×5): qty 2

## 2017-05-19 MED ORDER — DOPAMINE-DEXTROSE 3.2-5 MG/ML-% IV SOLN: 0.0000 ug/kg/min | INTRAVENOUS | Status: DC

## 2017-05-19 MED ORDER — SODIUM CHLORIDE 0.9% FLUSH: 3.0000 mL | INTRAVENOUS | Status: DC | PRN

## 2017-05-19 MED ORDER — MORPHINE SULFATE (PF) 4 MG/ML IV SOLN
2.0000 mg | INTRAVENOUS | Status: DC | PRN
  Administered 2017-05-20: 2 mg via INTRAVENOUS

## 2017-05-19 MED ORDER — SODIUM CHLORIDE 0.9 % IJ SOLN
OROMUCOSAL | Status: DC | PRN
  Administered 2017-05-19: 4 mL via TOPICAL

## 2017-05-19 MED ORDER — BISACODYL 10 MG RE SUPP: 10.0000 mg | Freq: Every day | RECTAL | Status: DC

## 2017-05-19 MED ORDER — FENTANYL CITRATE (PF) 250 MCG/5ML IJ SOLN
INTRAMUSCULAR | Status: DC | PRN
  Administered 2017-05-19 (×2): 100 ug via INTRAVENOUS
  Administered 2017-05-19: 250 ug via INTRAVENOUS
  Administered 2017-05-19: 100 ug via INTRAVENOUS
  Administered 2017-05-19: 250 ug via INTRAVENOUS
  Administered 2017-05-19: 100 ug via INTRAVENOUS
  Administered 2017-05-19: 50 ug via INTRAVENOUS
  Administered 2017-05-19: 150 ug via INTRAVENOUS
  Administered 2017-05-19 (×3): 100 ug via INTRAVENOUS
  Administered 2017-05-19 (×2): 50 ug via INTRAVENOUS

## 2017-05-19 MED ORDER — THROMBIN 5000 UNITS EX SOLR
CUTANEOUS | Status: DC | PRN
  Administered 2017-05-19: 5000 [IU] via TOPICAL

## 2017-05-19 MED ORDER — SODIUM CHLORIDE 0.45 % IV SOLN
INTRAVENOUS | Status: DC | PRN
  Administered 2017-05-19: 15:00:00 via INTRAVENOUS

## 2017-05-19 MED ORDER — LIDOCAINE 2% (20 MG/ML) 5 ML SYRINGE
INTRAMUSCULAR | Status: AC
Start: 2017-05-19 — End: 2017-05-19
  Filled 2017-05-19: qty 5

## 2017-05-19 MED ORDER — LACTATED RINGERS IV SOLN
INTRAVENOUS | Status: DC | PRN
  Administered 2017-05-19: 07:00:00 via INTRAVENOUS

## 2017-05-19 MED ORDER — DEXTROSE 5 % IV SOLN
1.5000 g | Freq: Two times a day (BID) | INTRAVENOUS | Status: AC
  Administered 2017-05-20 – 2017-05-21 (×4): 1.5 g via INTRAVENOUS
  Filled 2017-05-19 (×5): qty 1.5

## 2017-05-19 MED ORDER — MORPHINE SULFATE (PF) 2 MG/ML IV SOLN: 2.0000 mg | INTRAVENOUS | Status: DC | PRN

## 2017-05-19 MED ORDER — PHENYLEPHRINE HCL 10 MG/ML IJ SOLN
INTRAMUSCULAR | Status: DC | PRN
  Administered 2017-05-19: 80 ug via INTRAVENOUS
  Administered 2017-05-19: 40 ug via INTRAVENOUS

## 2017-05-19 MED ORDER — SODIUM CHLORIDE 0.9 % IV SOLN: 0.0000 ug/min | INTRAVENOUS | Status: DC

## 2017-05-19 MED ORDER — MORPHINE SULFATE (PF) 4 MG/ML IV SOLN
1.0000 mg | INTRAVENOUS | Status: DC | PRN
  Filled 2017-05-19: qty 1

## 2017-05-19 MED ORDER — PROPOFOL 10 MG/ML IV BOLUS
INTRAVENOUS | Status: AC
Start: 2017-05-19 — End: 2017-05-19
  Filled 2017-05-19: qty 40

## 2017-05-19 MED ORDER — PROTAMINE SULFATE 10 MG/ML IV SOLN
INTRAVENOUS | Status: DC | PRN
Start: 2017-05-19 — End: 2017-05-19
  Administered 2017-05-19: 10 mg via INTRAVENOUS
  Administered 2017-05-19: 230 mg via INTRAVENOUS

## 2017-05-19 MED ORDER — 0.9 % SODIUM CHLORIDE (POUR BTL) OPTIME
TOPICAL | Status: DC | PRN
  Administered 2017-05-19: 5000 mL

## 2017-05-19 MED ORDER — INSULIN REGULAR BOLUS VIA INFUSION
0.0000 [IU] | Freq: Three times a day (TID) | INTRAVENOUS | Status: DC
  Filled 2017-05-19: qty 10

## 2017-05-19 MED ORDER — METOPROLOL TARTRATE 12.5 MG HALF TABLET
12.5000 mg | ORAL_TABLET | Freq: Two times a day (BID) | ORAL | Status: DC
  Administered 2017-05-19 – 2017-05-21 (×4): 12.5 mg via ORAL
  Filled 2017-05-19 (×4): qty 1

## 2017-05-19 MED ORDER — SODIUM CHLORIDE 0.9 % IV SOLN
250.0000 mL | INTRAVENOUS | Status: DC
  Administered 2017-05-20: 250 mL via INTRAVENOUS

## 2017-05-19 MED ORDER — KETOROLAC TROMETHAMINE 15 MG/ML IJ SOLN
15.0000 mg | Freq: Four times a day (QID) | INTRAMUSCULAR | Status: AC
  Administered 2017-05-19 – 2017-05-20 (×3): 15 mg via INTRAVENOUS
  Filled 2017-05-19 (×3): qty 1

## 2017-05-19 MED ORDER — ACETAMINOPHEN 650 MG RE SUPP
650.0000 mg | Freq: Once | RECTAL | Status: AC
  Administered 2017-05-19: 650 mg via RECTAL

## 2017-05-19 MED ORDER — SODIUM CHLORIDE 0.9 % IV SOLN: INTRAVENOUS | Status: DC

## 2017-05-19 MED ORDER — PHENYLEPHRINE HCL 10 MG/ML IJ SOLN
INTRAVENOUS | Status: DC | PRN
  Administered 2017-05-19: 30 ug/min via INTRAVENOUS

## 2017-05-19 MED ORDER — ONDANSETRON HCL 4 MG/2ML IJ SOLN
4.0000 mg | Freq: Four times a day (QID) | INTRAMUSCULAR | Status: DC | PRN
  Administered 2017-05-20 (×2): 4 mg via INTRAVENOUS
  Filled 2017-05-19 (×3): qty 2

## 2017-05-19 MED ORDER — NITROGLYCERIN IN D5W 200-5 MCG/ML-% IV SOLN: 0.0000 ug/min | INTRAVENOUS | Status: DC

## 2017-05-19 MED ORDER — ACETAMINOPHEN 500 MG PO TABS
1000.0000 mg | ORAL_TABLET | Freq: Four times a day (QID) | ORAL | Status: DC
  Administered 2017-05-19 – 2017-05-24 (×16): 1000 mg via ORAL
  Filled 2017-05-19 (×14): qty 2

## 2017-05-19 MED ORDER — TRAMADOL HCL 50 MG PO TABS
50.0000 mg | ORAL_TABLET | ORAL | Status: DC | PRN
  Administered 2017-05-21: 100 mg via ORAL
  Administered 2017-05-22 (×2): 50 mg via ORAL
  Filled 2017-05-19 (×2): qty 1
  Filled 2017-05-19: qty 2

## 2017-05-19 MED ORDER — FENTANYL CITRATE (PF) 250 MCG/5ML IJ SOLN
INTRAMUSCULAR | Status: AC
  Filled 2017-05-19: qty 25

## 2017-05-19 MED ORDER — MAGNESIUM SULFATE 4 GM/100ML IV SOLN
4.0000 g | Freq: Once | INTRAVENOUS | Status: AC
  Administered 2017-05-19: 4 g via INTRAVENOUS
  Filled 2017-05-19: qty 100

## 2017-05-19 MED ORDER — SODIUM CHLORIDE 0.9 % IV SOLN
INTRAVENOUS | Status: DC
  Administered 2017-05-19: 2.6 [IU]/h via INTRAVENOUS
  Filled 2017-05-19: qty 1

## 2017-05-19 MED ORDER — ROCURONIUM BROMIDE 10 MG/ML (PF) SYRINGE
PREFILLED_SYRINGE | INTRAVENOUS | Status: DC | PRN
  Administered 2017-05-19: 100 mg via INTRAVENOUS
  Administered 2017-05-19 (×5): 50 mg via INTRAVENOUS

## 2017-05-19 MED ORDER — PROPOFOL 10 MG/ML IV BOLUS
INTRAVENOUS | Status: DC | PRN
  Administered 2017-05-19: 50 mg via INTRAVENOUS

## 2017-05-19 MED ORDER — FAMOTIDINE IN NACL 20-0.9 MG/50ML-% IV SOLN
20.0000 mg | Freq: Two times a day (BID) | INTRAVENOUS | Status: DC
  Administered 2017-05-19: 20 mg via INTRAVENOUS

## 2017-05-19 MED ORDER — SODIUM CHLORIDE 0.9% FLUSH
3.0000 mL | Freq: Two times a day (BID) | INTRAVENOUS | Status: DC
  Administered 2017-05-20 – 2017-05-23 (×3): 3 mL via INTRAVENOUS

## 2017-05-19 MED ORDER — METOPROLOL TARTRATE 5 MG/5ML IV SOLN: 2.5000 mg | INTRAVENOUS | Status: DC | PRN

## 2017-05-19 MED ORDER — ACETAMINOPHEN 160 MG/5ML PO SOLN: 1000.0000 mg | Freq: Four times a day (QID) | ORAL | Status: DC

## 2017-05-19 MED ORDER — POTASSIUM CHLORIDE 10 MEQ/50ML IV SOLN
10.0000 meq | INTRAVENOUS | Status: AC
  Administered 2017-05-19 (×3): 10 meq via INTRAVENOUS

## 2017-05-19 MED ORDER — MIDAZOLAM HCL 5 MG/5ML IJ SOLN
INTRAMUSCULAR | Status: DC | PRN
  Administered 2017-05-19: 4 mg via INTRAVENOUS
  Administered 2017-05-19 (×2): 1 mg via INTRAVENOUS
  Administered 2017-05-19: 10 mg via INTRAVENOUS
  Administered 2017-05-19 (×2): 1 mg via INTRAVENOUS

## 2017-05-19 MED ORDER — ALBUMIN HUMAN 5 % IV SOLN
250.0000 mL | INTRAVENOUS | Status: AC | PRN
  Administered 2017-05-19 (×3): 250 mL via INTRAVENOUS
  Filled 2017-05-19 (×2): qty 250

## 2017-05-19 MED ORDER — VANCOMYCIN HCL IN DEXTROSE 1-5 GM/200ML-% IV SOLN
1000.0000 mg | Freq: Two times a day (BID) | INTRAVENOUS | Status: AC
  Administered 2017-05-19 – 2017-05-20 (×2): 1000 mg via INTRAVENOUS
  Filled 2017-05-19 (×2): qty 200

## 2017-05-19 MED ORDER — METOPROLOL TARTRATE 25 MG/10 ML ORAL SUSPENSION: 12.5000 mg | Freq: Two times a day (BID) | ORAL | Status: DC

## 2017-05-19 MED ORDER — SODIUM CHLORIDE 0.9 % IV SOLN
0.0000 ug/kg/h | INTRAVENOUS | Status: DC
  Filled 2017-05-19 (×3): qty 2

## 2017-05-19 MED ORDER — MIDAZOLAM HCL 2 MG/2ML IJ SOLN: 2.0000 mg | INTRAMUSCULAR | Status: DC | PRN

## 2017-05-19 MED ORDER — MIDAZOLAM HCL 10 MG/2ML IJ SOLN
INTRAMUSCULAR | Status: AC
  Filled 2017-05-19: qty 2

## 2017-05-19 MED ORDER — FENTANYL CITRATE (PF) 250 MCG/5ML IJ SOLN
INTRAMUSCULAR | Status: AC
  Filled 2017-05-19: qty 5

## 2017-05-19 MED ORDER — SODIUM CHLORIDE 0.9 % IV SOLN
INTRAVENOUS | Status: DC | PRN
  Administered 2017-05-19: 13:00:00 via INTRAVENOUS

## 2017-05-19 MED ORDER — PROTAMINE SULFATE 10 MG/ML IV SOLN
INTRAVENOUS | Status: AC
  Filled 2017-05-19: qty 25

## 2017-05-19 MED ORDER — ASPIRIN 81 MG PO CHEW: 324.0000 mg | CHEWABLE_TABLET | Freq: Every day | ORAL | Status: DC

## 2017-05-19 MED ORDER — OXYCODONE HCL 5 MG PO TABS
5.0000 mg | ORAL_TABLET | ORAL | Status: DC | PRN
  Administered 2017-05-19 – 2017-05-21 (×9): 10 mg via ORAL
  Administered 2017-05-22: 5 mg via ORAL
  Administered 2017-05-22: 10 mg via ORAL
  Filled 2017-05-19 (×3): qty 2
  Filled 2017-05-19: qty 1
  Filled 2017-05-19 (×8): qty 2

## 2017-05-19 MED ORDER — LACTATED RINGERS IV SOLN: 500.0000 mL | Freq: Once | INTRAVENOUS | Status: DC | PRN

## 2017-05-19 MED ORDER — VANCOMYCIN HCL IN DEXTROSE 1-5 GM/200ML-% IV SOLN
1000.0000 mg | Freq: Once | INTRAVENOUS | Status: DC
  Filled 2017-05-19: qty 200

## 2017-05-19 MED ORDER — ACETAMINOPHEN 160 MG/5ML PO SOLN: 650.0000 mg | Freq: Once | ORAL | Status: AC

## 2017-05-19 SURGICAL SUPPLY — 109 items
ADAPTER CARDIO PERF ANTE/RETRO (ADAPTER) ×4 IMPLANT
BAG DECANTER FOR FLEXI CONT (MISCELLANEOUS) ×4 IMPLANT
BANDAGE ACE 4X5 VEL STRL LF (GAUZE/BANDAGES/DRESSINGS) ×4 IMPLANT
BANDAGE ACE 6X5 VEL STRL LF (GAUZE/BANDAGES/DRESSINGS) ×4 IMPLANT
BASKET HEART  (ORDER IN 25'S) (MISCELLANEOUS) ×1
BASKET HEART (ORDER IN 25'S) (MISCELLANEOUS) ×1
BASKET HEART (ORDER IN 25S) (MISCELLANEOUS) ×2 IMPLANT
BLADE CLIPPER SURG (BLADE) ×4 IMPLANT
BLADE MINI RND TIP GREEN BEAV (BLADE) ×4 IMPLANT
BLADE STERNUM SYSTEM 6 (BLADE) ×4 IMPLANT
BLADE SURG 12 STRL SS (BLADE) ×4 IMPLANT
BNDG GAUZE ELAST 4 BULKY (GAUZE/BANDAGES/DRESSINGS) ×4 IMPLANT
CANISTER SUCT 3000ML PPV (MISCELLANEOUS) ×4 IMPLANT
CANNULA GUNDRY RCSP 15FR (MISCELLANEOUS) ×4 IMPLANT
CATH CPB KIT VANTRIGT (MISCELLANEOUS) ×4 IMPLANT
CATH ROBINSON RED A/P 18FR (CATHETERS) ×12 IMPLANT
CATH THORACIC 36FR RT ANG (CATHETERS) ×4 IMPLANT
CLIP FOGARTY SPRING 6M (CLIP) ×8 IMPLANT
CLIP VESOCCLUDE MED 24/CT (CLIP) ×8 IMPLANT
CLIP VESOCCLUDE SM WIDE 24/CT (CLIP) ×4 IMPLANT
CRADLE DONUT ADULT HEAD (MISCELLANEOUS) ×4 IMPLANT
DRAIN CHANNEL 32F RND 10.7 FF (WOUND CARE) ×4 IMPLANT
DRAPE CARDIOVASCULAR INCISE (DRAPES) ×2
DRAPE SLUSH/WARMER DISC (DRAPES) ×4 IMPLANT
DRAPE SRG 135X102X78XABS (DRAPES) ×2 IMPLANT
DRSG AQUACEL AG ADV 3.5X14 (GAUZE/BANDAGES/DRESSINGS) ×4 IMPLANT
ELECT BLADE 4.0 EZ CLEAN MEGAD (MISCELLANEOUS) ×4
ELECT BLADE 6.5 EXT (BLADE) ×4 IMPLANT
ELECT CAUTERY BLADE 6.4 (BLADE) ×4 IMPLANT
ELECT REM PT RETURN 9FT ADLT (ELECTROSURGICAL) ×8
ELECTRODE BLDE 4.0 EZ CLN MEGD (MISCELLANEOUS) ×2 IMPLANT
ELECTRODE REM PT RTRN 9FT ADLT (ELECTROSURGICAL) ×4 IMPLANT
FELT TEFLON 1X6 (MISCELLANEOUS) ×8 IMPLANT
GAUZE SPONGE 4X4 12PLY STRL (GAUZE/BANDAGES/DRESSINGS) ×8 IMPLANT
GAUZE SPONGE 4X4 12PLY STRL LF (GAUZE/BANDAGES/DRESSINGS) ×8 IMPLANT
GLOVE BIO SURGEON STRL SZ 6.5 (GLOVE) ×15 IMPLANT
GLOVE BIO SURGEON STRL SZ7 (GLOVE) ×12 IMPLANT
GLOVE BIO SURGEON STRL SZ7.5 (GLOVE) ×24 IMPLANT
GLOVE BIO SURGEONS STRL SZ 6.5 (GLOVE) ×5
GLOVE BIOGEL PI IND STRL 6.5 (GLOVE) ×6 IMPLANT
GLOVE BIOGEL PI INDICATOR 6.5 (GLOVE) ×6
GOWN STRL REUS W/ TWL LRG LVL3 (GOWN DISPOSABLE) ×8 IMPLANT
GOWN STRL REUS W/TWL LRG LVL3 (GOWN DISPOSABLE) ×8
HEMOSTAT POWDER SURGIFOAM 1G (HEMOSTASIS) ×12 IMPLANT
HEMOSTAT SURGICEL 2X14 (HEMOSTASIS) ×4 IMPLANT
INSERT FOGARTY XLG (MISCELLANEOUS) IMPLANT
KIT BASIN OR (CUSTOM PROCEDURE TRAY) ×4 IMPLANT
KIT ROOM TURNOVER OR (KITS) ×4 IMPLANT
KIT SUCTION CATH 14FR (SUCTIONS) ×4 IMPLANT
KIT VASOVIEW HEMOPRO VH 3000 (KITS) ×4 IMPLANT
LEAD PACING MYOCARDI (MISCELLANEOUS) ×4 IMPLANT
MARKER GRAFT CORONARY BYPASS (MISCELLANEOUS) ×12 IMPLANT
NS IRRIG 1000ML POUR BTL (IV SOLUTION) ×20 IMPLANT
PACK OPEN HEART (CUSTOM PROCEDURE TRAY) ×4 IMPLANT
PAD ARMBOARD 7.5X6 YLW CONV (MISCELLANEOUS) ×8 IMPLANT
PAD ELECT DEFIB RADIOL ZOLL (MISCELLANEOUS) ×4 IMPLANT
PENCIL BUTTON HOLSTER BLD 10FT (ELECTRODE) ×4 IMPLANT
PUNCH AORTIC ROTATE 4.0MM (MISCELLANEOUS) IMPLANT
PUNCH AORTIC ROTATE 4.5MM 8IN (MISCELLANEOUS) ×4 IMPLANT
PUNCH AORTIC ROTATE 5MM 8IN (MISCELLANEOUS) IMPLANT
SET CARDIOPLEGIA MPS 5001102 (MISCELLANEOUS) ×4 IMPLANT
SPOGE SURGIFLO 8M (HEMOSTASIS) ×2
SPONGE LAP 4X18 X RAY DECT (DISPOSABLE) ×4 IMPLANT
SPONGE SURGIFLO 8M (HEMOSTASIS) ×2 IMPLANT
SURGIFLO W/THROMBIN 8M KIT (HEMOSTASIS) IMPLANT
SUT BONE WAX W31G (SUTURE) ×4 IMPLANT
SUT ETHIBOND 2 0 SH (SUTURE) ×8
SUT ETHIBOND 2 0 SH 36X2 (SUTURE) ×8 IMPLANT
SUT MNCRL AB 4-0 PS2 18 (SUTURE) IMPLANT
SUT PROLENE 3 0 SH DA (SUTURE) IMPLANT
SUT PROLENE 3 0 SH1 36 (SUTURE) IMPLANT
SUT PROLENE 4 0 RB 1 (SUTURE) ×4
SUT PROLENE 4 0 SH DA (SUTURE) ×4 IMPLANT
SUT PROLENE 4-0 RB1 .5 CRCL 36 (SUTURE) ×4 IMPLANT
SUT PROLENE 5 0 C 1 36 (SUTURE) ×8 IMPLANT
SUT PROLENE 6 0 C 1 30 (SUTURE) ×8 IMPLANT
SUT PROLENE 6 0 CC (SUTURE) ×32 IMPLANT
SUT PROLENE 8 0 BV175 6 (SUTURE) ×12 IMPLANT
SUT PROLENE BLUE 7 0 (SUTURE) ×8 IMPLANT
SUT SILK  1 MH (SUTURE) ×6
SUT SILK 1 MH (SUTURE) ×6 IMPLANT
SUT SILK 1 TIES 10X30 (SUTURE) ×4 IMPLANT
SUT SILK 2 0 SH CR/8 (SUTURE) ×8 IMPLANT
SUT SILK 2 0 TIES 10X30 (SUTURE) ×4 IMPLANT
SUT SILK 2 0 TIES 17X18 (SUTURE) ×2
SUT SILK 2-0 18XBRD TIE BLK (SUTURE) ×2 IMPLANT
SUT SILK 3 0 SH CR/8 (SUTURE) ×8 IMPLANT
SUT SILK 4 0 TIE 10X30 (SUTURE) ×8 IMPLANT
SUT STEEL 6MS V (SUTURE) ×8 IMPLANT
SUT STEEL SZ 6 DBL 3X14 BALL (SUTURE) ×4 IMPLANT
SUT TEM PAC WIRE 2 0 SH (SUTURE) ×16 IMPLANT
SUT VIC AB 1 CTX 36 (SUTURE) ×4
SUT VIC AB 1 CTX36XBRD ANBCTR (SUTURE) ×4 IMPLANT
SUT VIC AB 2-0 CT1 27 (SUTURE) ×2
SUT VIC AB 2-0 CT1 TAPERPNT 27 (SUTURE) ×2 IMPLANT
SUT VIC AB 2-0 CTX 27 (SUTURE) ×8 IMPLANT
SUT VIC AB 3-0 X1 27 (SUTURE) ×12 IMPLANT
SUTURE E-PAK OPEN HEART (SUTURE) ×4 IMPLANT
SYSTEM SAHARA CHEST DRAIN ATS (WOUND CARE) ×4 IMPLANT
TAPE CLOTH SURG 4X10 WHT LF (GAUZE/BANDAGES/DRESSINGS) ×4 IMPLANT
TAPE PAPER 3X10 WHT MICROPORE (GAUZE/BANDAGES/DRESSINGS) ×4 IMPLANT
TOWEL GREEN STERILE (TOWEL DISPOSABLE) ×16 IMPLANT
TOWEL GREEN STERILE FF (TOWEL DISPOSABLE) ×8 IMPLANT
TOWEL OR 17X24 6PK STRL BLUE (TOWEL DISPOSABLE) ×8 IMPLANT
TOWEL OR 17X26 10 PK STRL BLUE (TOWEL DISPOSABLE) ×8 IMPLANT
TRAY FOLEY SILVER 16FR TEMP (SET/KITS/TRAYS/PACK) ×4 IMPLANT
TUBING INSUFFLATION (TUBING) ×4 IMPLANT
UNDERPAD 30X30 (UNDERPADS AND DIAPERS) ×4 IMPLANT
WATER STERILE IRR 1000ML POUR (IV SOLUTION) ×8 IMPLANT

## 2017-05-19 NOTE — Anesthesia Procedure Notes (Signed)
Central Venous Catheter Insertion Performed by: Audry Pili, anesthesiologist Start/End10/22/2018 6:59 AM, 05/19/2017 7:01 AM Patient location: Pre-op. Preanesthetic checklist: patient identified, IV checked, risks and benefits discussed, surgical consent, monitors and equipment checked, pre-op evaluation, timeout performed and anesthesia consent Hand hygiene performed  and maximum sterile barriers used  Total catheter length 10. PA cath was placed.Swan type:thermodilution PA Cath depth:50 Procedure performed without using ultrasound guided technique. Attempts: 1 Patient tolerated the procedure well with no immediate complications.

## 2017-05-19 NOTE — Anesthesia Procedure Notes (Addendum)
Procedure Name: Intubation Date/Time: 05/19/2017 7:50 AM Performed by: Salli Quarry Akaysha Cobern Pre-anesthesia Checklist: Patient identified, Emergency Drugs available, Suction available and Patient being monitored Patient Re-evaluated:Patient Re-evaluated prior to induction Oxygen Delivery Method: Circle System Utilized Preoxygenation: Pre-oxygenation with 100% oxygen Induction Type: IV induction Ventilation: Mask ventilation without difficulty Laryngoscope Size: Mac and 4 Grade View: Grade I Tube type: Oral Tube size: 8.0 mm Number of attempts: 1 Airway Equipment and Method: Stylet and Oral airway Placement Confirmation: ETT inserted through vocal cords under direct vision,  positive ETCO2 and breath sounds checked- equal and bilateral Secured at: 24 cm Tube secured with: Tape Dental Injury: Teeth and Oropharynx as per pre-operative assessment

## 2017-05-19 NOTE — Progress Notes (Deleted)
  Echocardiogram 2D Echocardiogram has been performed.  Merrie Roof F 05/19/2017, 10:50 AM

## 2017-05-19 NOTE — Anesthesia Procedure Notes (Signed)
Central Venous Catheter Insertion Performed by: Audry Pili, anesthesiologist Start/End10/22/2018 6:43 AM, 05/19/2017 7:02 AM Patient location: Pre-op. Preanesthetic checklist: patient identified, IV checked, risks and benefits discussed, surgical consent, monitors and equipment checked, pre-op evaluation, timeout performed and anesthesia consent Position: Trendelenburg Lidocaine 1% used for infiltration and patient sedated Hand hygiene performed , maximum sterile barriers used  and Seldinger technique used Catheter size: 8.5 Fr Total catheter length 10. Central line was placed.Sheath introducer Swan type:thermodilution PA Cath depth:50 Procedure performed using ultrasound guided technique. Ultrasound Notes:anatomy identified, needle tip was noted to be adjacent to the nerve/plexus identified, no ultrasound evidence of intravascular and/or intraneural injection and image(s) printed for medical record Attempts: 1 Following insertion, line sutured and dressing applied. Post procedure assessment: blood return through all ports, free fluid flow and no air  Patient tolerated the procedure well with no immediate complications.

## 2017-05-19 NOTE — Anesthesia Procedure Notes (Addendum)
Arterial Line Insertion Start/End10/22/2018 6:45 AM, 05/19/2017 7:20 AM Performed by: Nolon Nations, anesthesiologist  Patient location: Pre-op. Preanesthetic checklist: patient identified, IV checked, site marked, risks and benefits discussed, surgical consent, monitors and equipment checked, pre-op evaluation and timeout performed Lidocaine 1% used for infiltration and patient sedated Right, radial was placed Catheter size: 18 G Hand hygiene performed , maximum sterile barriers used  and Seldinger technique used Allen's test indicative of satisfactory collateral circulation Attempts: 4 (1 x by Viviann Broyles with [place]) Procedure performed using ultrasound guided technique. Ultrasound Notes:anatomy identified, needle tip was noted to be adjacent to the nerve/plexus identified, no ultrasound evidence of intravascular and/or intraneural injection and image(s) printed for medical record Following insertion, dressing applied and Biopatch. Post procedure assessment: normal  Patient tolerated the procedure well with no immediate complications. Additional procedure comments: Image labeled as left, but was right arterial line.Marland Kitchen

## 2017-05-19 NOTE — Progress Notes (Signed)
  Echocardiogram Echocardiogram Transesophageal has been performed.  Merrie Roof F 05/19/2017, 11:05 AM

## 2017-05-19 NOTE — Progress Notes (Signed)
CT surgery p.m. Rounds  Status post CABG 4 earlier today Now extubated with stable hemodynamics Normal sinus rhythm O2 sat 95% Neuro intact

## 2017-05-19 NOTE — Progress Notes (Signed)
Pre Procedure note for inpatients:   Jason Lewis has been scheduled for Procedure(s): CORONARY ARTERY BYPASS GRAFTING (CABG) (N/A) TRANSESOPHAGEAL ECHOCARDIOGRAM (TEE) (N/A) today. The various methods of treatment have been discussed with the patient. After consideration of the risks, benefits and treatment options the patient has consented to the planned procedure.   The patient has been seen and labs reviewed. There are no changes in the patient's condition to prevent proceeding with the planned procedure today.  Recent labs:  Lab Results  Component Value Date   WBC 8.2 05/19/2017   HGB 14.0 05/19/2017   HCT 41.8 05/19/2017   PLT 277 05/19/2017   GLUCOSE 105 (H) 05/19/2017   CHOL 210 (H) 05/12/2017   TRIG 124 05/12/2017   HDL 44 05/12/2017   LDLCALC 141 (H) 05/12/2017   ALT 102 (H) 05/18/2017   AST 77 (H) 05/18/2017   NA 135 05/19/2017   K 4.0 05/19/2017   CL 101 05/19/2017   CREATININE 1.13 05/19/2017   BUN 17 05/19/2017   CO2 26 05/19/2017   TSH 9.087 (H) 05/16/2017   INR 0.97 05/16/2017   HGBA1C 5.7 (H) 05/18/2017    Len Childs, MD 05/19/2017 7:32 AM

## 2017-05-19 NOTE — Anesthesia Postprocedure Evaluation (Signed)
Anesthesia Post Note  Patient: Jason Lewis  Procedure(s) Performed: CORONARY ARTERY BYPASS GRAFTING (CABG) x 4 (N/A Chest) TRANSESOPHAGEAL ECHOCARDIOGRAM (TEE) (N/A )     Patient location during evaluation: SICU Anesthesia Type: General Level of consciousness: sedated and patient remains intubated per anesthesia plan Pain management: pain level controlled Vital Signs Assessment: post-procedure vital signs reviewed and stable Respiratory status: patient remains intubated per anesthesia plan Cardiovascular status: stable Anesthetic complications: no    Last Vitals:  Vitals:   05/19/17 1405 05/19/17 1445  BP: (!) 83/49   Pulse: 88 88  Resp: 14 12  Temp: (!) 36.3 C (!) 36.1 C  SpO2: 97% 100%    Last Pain:  Vitals:   05/19/17 0520  TempSrc: Oral  PainSc:                  Nolon Nations

## 2017-05-19 NOTE — Progress Notes (Signed)
[facility]  Care transferred to CT surgery today. Please call [facility] if needed again. Thank you.  Debbe Odea, MD

## 2017-05-19 NOTE — Procedures (Signed)
Extubation Procedure Note  Patient Details:   Name: Jason Lewis DOB: 1950/10/29 MRN: [id number]   Airway Documentation:     Evaluation  O2 sats: stable throughout Complications: No apparent complications Patient did tolerate procedure well. Bilateral Breath Sounds: Clear   Yes   Patient extubated to [place]. Vital signs stable at this time. No complications. NIF-40.  VC-2.1L. RN at bedside. RT will continue to monitor.  Mcneil Sober 05/19/2017, 5:35 PM

## 2017-05-19 NOTE — Care Management Note (Addendum)
Case Management Note  Patient Details  Name: Jason Lewis MRN: [id number] Date of Birth: 1950/09/07  Subjective/Objective:    From home with wife, pta indep, post op CABG.  He has PCP and medication coverage.  10/23 [place], BSN- dc chest tubes, cont to diurese, start plavix.                  Action/Plan: NCM will follow for dc needs.   Expected Discharge Date:                  Expected Discharge Plan:  Home/Self Care  In-House Referral:     Discharge planning Services  CM Consult  Post Acute Care Choice:    Choice offered to:     DME Arranged:    DME Agency:     HH Arranged:    HH Agency:     Status of Service:  In process, will continue to follow  If discussed at Long Length of Stay Meetings, dates discussed:    Additional Comments:  Zenon Mayo, RN 05/19/2017, 3:30 PM

## 2017-05-19 NOTE — Brief Op Note (Signed)
05/12/2017 - 05/19/2017  12:15 PM  PATIENT:  Jason Lewis  66 y.o. male  PRE-OPERATIVE DIAGNOSIS:  CAD  POST-OPERATIVE DIAGNOSIS:  CAD  PROCEDURE:  Procedure(s): CORONARY ARTERY BYPASS GRAFTING (CABG) (N/A) TRANSESOPHAGEAL ECHOCARDIOGRAM (TEE) (N/A)  SURGEON:  Surgeon(s) and Role:    Ivin Poot, MD - Primary  PHYSICIAN ASSISTANT: WAYNE GOLD PA-C  ANESTHESIA:   general  EBL: SEE ANEST/PERFUSION RECORDS  BLOOD ADMINISTERED:none  DRAINS: 2 CHEST TUBES   LOCAL MEDICATIONS USED:  NONE  SPECIMEN:  No Specimen  DISPOSITION OF SPECIMEN:  N/A  COUNTS:  YES  TOURNIQUET:  * No tourniquets in log *  DICTATION: .Other Dictation: Dictation Number PENDING  PLAN OF CARE: Admit to inpatient   PATIENT DISPOSITION:  ICU - intubated and hemodynamically stable.   Delay start of Pharmacological VTE agent (>24hrs) due to surgical blood loss or risk of bleeding: yes  COMPLICATIONS: NO KNOWN

## 2017-05-19 NOTE — Transfer of Care (Signed)
Immediate Anesthesia Transfer of Care Note  Patient: Jason Lewis  Procedure(s) Performed: CORONARY ARTERY BYPASS GRAFTING (CABG) x 4 (N/A Chest) TRANSESOPHAGEAL ECHOCARDIOGRAM (TEE) (N/A )  Patient Location: ICU  Anesthesia Type:General  Level of Consciousness: Patient remains intubated per anesthesia plan  Airway & Oxygen Therapy: Patient remains intubated per anesthesia plan and Patient placed on Ventilator (see vital sign flow sheet for setting)  Post-op Assessment: Report given to RN and Post -op Vital signs reviewed and stable  Post vital signs: Reviewed and stable  Last Vitals:  Vitals:   05/18/17 2233 05/19/17 0520  BP:  124/74  Pulse: (!) 54   Resp:    Temp:  36.6 C  SpO2:  98%    Last Pain:  Vitals:   05/19/17 0520  TempSrc: Oral  PainSc:          Complications: No apparent anesthesia complications

## 2017-05-20 ENCOUNTER — Encounter (HOSPITAL_COMMUNITY): Payer: Self-pay | Admitting: Cardiothoracic Surgery

## 2017-05-20 ENCOUNTER — Inpatient Hospital Stay (HOSPITAL_COMMUNITY): Payer: PPO

## 2017-05-20 DIAGNOSIS — I25110 Atherosclerotic heart disease of native coronary artery with unstable angina pectoris: Secondary | ICD-10-CM

## 2017-05-20 LAB — CBC
HCT: 27.5 % — ABNORMAL LOW (ref 39.0–52.0)
HCT: 27.6 % — ABNORMAL LOW (ref 39.0–52.0)
HEMOGLOBIN: 9.2 g/dL — AB (ref 13.0–17.0)
Hemoglobin: 9.1 g/dL — ABNORMAL LOW (ref 13.0–17.0)
MCH: 30.4 pg (ref 26.0–34.0)
MCH: 30.4 pg (ref 26.0–34.0)
MCHC: 33.5 g/dL (ref 30.0–36.0)
MCHC: 33 g/dL (ref 30.0–36.0)
MCV: 90.8 fL (ref 78.0–100.0)
MCV: 92.3 fL (ref 78.0–100.0)
PLATELETS: 166 10*3/uL (ref 150–400)
PLATELETS: 163 10*3/uL (ref 150–400)
RBC: 3.03 MIL/uL — AB (ref 4.22–5.81)
RBC: 2.99 MIL/uL — ABNORMAL LOW (ref 4.22–5.81)
RDW: 12.8 % (ref 11.5–15.5)
RDW: 12.9 % (ref 11.5–15.5)
WBC: 14 10*3/uL — AB (ref 4.0–10.5)
WBC: 16.8 10*3/uL — ABNORMAL HIGH (ref 4.0–10.5)

## 2017-05-20 LAB — PREPARE FRESH FROZEN PLASMA
UNIT DIVISION: 0
Unit division: 0

## 2017-05-20 LAB — GLUCOSE, CAPILLARY
GLUCOSE-CAPILLARY: 120 mg/dL — AB (ref 65–99)
GLUCOSE-CAPILLARY: 151 mg/dL — AB (ref 65–99)
Glucose-Capillary: 138 mg/dL — ABNORMAL HIGH (ref 65–99)
Glucose-Capillary: 142 mg/dL — ABNORMAL HIGH (ref 65–99)
Glucose-Capillary: 85 mg/dL (ref 65–99)
Glucose-Capillary: 86 mg/dL (ref 65–99)
Glucose-Capillary: 91 mg/dL (ref 65–99)

## 2017-05-20 LAB — POCT I-STAT, CHEM 8
BUN: 19 mg/dL (ref 6–20)
CREATININE: 1.1 mg/dL (ref 0.61–1.24)
Calcium, Ion: 1.17 mmol/L (ref 1.15–1.40)
Chloride: 100 mmol/L — ABNORMAL LOW (ref 101–111)
GLUCOSE: 154 mg/dL — AB (ref 65–99)
HCT: 25 % — ABNORMAL LOW (ref 39.0–52.0)
HEMOGLOBIN: 8.5 g/dL — AB (ref 13.0–17.0)
Potassium: 4.6 mmol/L (ref 3.5–5.1)
Sodium: 134 mmol/L — ABNORMAL LOW (ref 135–145)
TCO2: 26 mmol/L (ref 22–32)

## 2017-05-20 LAB — BPAM FFP
BLOOD PRODUCT EXPIRATION DATE: 201810262359
BLOOD PRODUCT EXPIRATION DATE: 201810262359
ISSUE DATE / TIME: 201810221212
ISSUE DATE / TIME: 201810221212
Unit Type and Rh: 7300
Unit Type and Rh: 7300

## 2017-05-20 LAB — BASIC METABOLIC PANEL
Anion gap: 9 (ref 5–15)
BUN: 14 mg/dL (ref 6–20)
CALCIUM: 7.9 mg/dL — AB (ref 8.9–10.3)
CHLORIDE: 103 mmol/L (ref 101–111)
CO2: 21 mmol/L — ABNORMAL LOW (ref 22–32)
CREATININE: 1.09 mg/dL (ref 0.61–1.24)
GFR calc Af Amer: >60 mL/min (ref >60)
GLUCOSE: 125 mg/dL — AB (ref 65–99)
POTASSIUM: 4.2 mmol/L (ref 3.5–5.1)
SODIUM: 133 mmol/L — AB (ref 135–145)

## 2017-05-20 LAB — MAGNESIUM
Magnesium: 2.4 mg/dL (ref 1.7–2.4)
Magnesium: 2.5 mg/dL — ABNORMAL HIGH (ref 1.7–2.4)

## 2017-05-20 LAB — CREATININE, SERUM
Creatinine, Ser: 1.17 mg/dL (ref 0.61–1.24)
GFR calc Af Amer: >60 mL/min (ref >60)
GFR calc non Af Amer: >60 mL/min (ref >60)

## 2017-05-20 MED ORDER — METOCLOPRAMIDE HCL 5 MG/ML IJ SOLN
10.0000 mg | Freq: Four times a day (QID) | INTRAMUSCULAR | Status: DC
  Administered 2017-05-20 – 2017-05-23 (×11): 10 mg via INTRAVENOUS
  Filled 2017-05-20 (×11): qty 2

## 2017-05-20 MED ORDER — INSULIN ASPART 100 UNIT/ML ~~LOC~~ SOLN
0.0000 [IU] | SUBCUTANEOUS | Status: DC
  Administered 2017-05-20 (×3): 2 [IU] via SUBCUTANEOUS
  Administered 2017-05-21: 4 [IU] via SUBCUTANEOUS
  Administered 2017-05-21: 2 [IU] via SUBCUTANEOUS

## 2017-05-20 MED ORDER — FUROSEMIDE 10 MG/ML IJ SOLN
20.0000 mg | Freq: Two times a day (BID) | INTRAMUSCULAR | Status: DC
  Administered 2017-05-20 (×2): 20 mg via INTRAVENOUS
  Filled 2017-05-20 (×2): qty 2

## 2017-05-20 MED ORDER — INSULIN ASPART 100 UNIT/ML ~~LOC~~ SOLN
0.0000 [IU] | SUBCUTANEOUS | Status: DC
  Administered 2017-05-20: 2 [IU] via SUBCUTANEOUS

## 2017-05-20 MED ORDER — CLOPIDOGREL BISULFATE 75 MG PO TABS
75.0000 mg | ORAL_TABLET | Freq: Every day | ORAL | Status: DC
  Administered 2017-05-20 – 2017-05-24 (×5): 75 mg via ORAL
  Filled 2017-05-20 (×5): qty 1

## 2017-05-20 MED ORDER — FENTANYL CITRATE (PF) 100 MCG/2ML IJ SOLN: 50.0000 ug | INTRAMUSCULAR | Status: DC | PRN

## 2017-05-20 MED ORDER — ASPIRIN EC 81 MG PO TBEC
81.0000 mg | DELAYED_RELEASE_TABLET | Freq: Every day | ORAL | Status: DC
  Administered 2017-05-20 – 2017-05-24 (×5): 81 mg via ORAL
  Filled 2017-05-20 (×5): qty 1

## 2017-05-20 MED ORDER — KETOROLAC TROMETHAMINE 15 MG/ML IJ SOLN
15.0000 mg | Freq: Four times a day (QID) | INTRAMUSCULAR | Status: DC
  Administered 2017-05-20: 15 mg via INTRAVENOUS

## 2017-05-20 MED FILL — Mannitol IV Soln 20%: INTRAVENOUS | Qty: 500 | Status: AC

## 2017-05-20 MED FILL — Lidocaine HCl IV Inj 20 MG/ML: INTRAVENOUS | Qty: 5 | Status: AC

## 2017-05-20 MED FILL — Thrombin (Recombinant) For Soln 5000 Unit: CUTANEOUS | Qty: 5000 | Status: AC

## 2017-05-20 MED FILL — Electrolyte-R (PH 7.4) Solution: INTRAVENOUS | Qty: 4000 | Status: AC

## 2017-05-20 MED FILL — Potassium Chloride Inj 2 mEq/ML: INTRAVENOUS | Qty: 40 | Status: AC

## 2017-05-20 MED FILL — Heparin Sodium (Porcine) Inj 1000 Unit/ML: INTRAMUSCULAR | Qty: 10 | Status: AC

## 2017-05-20 MED FILL — Magnesium Sulfate Inj 50%: INTRAMUSCULAR | Qty: 10 | Status: AC

## 2017-05-20 MED FILL — Sodium Chloride IV Soln 0.9%: INTRAVENOUS | Qty: 2000 | Status: AC

## 2017-05-20 MED FILL — Heparin Sodium (Porcine) Inj 1000 Unit/ML: INTRAMUSCULAR | Qty: 30 | Status: AC

## 2017-05-20 MED FILL — Sodium Bicarbonate IV Soln 8.4%: INTRAVENOUS | Qty: 100 | Status: AC

## 2017-05-20 NOTE — Progress Notes (Signed)
1 Day Post-Op Procedure(s) (LRB): CORONARY ARTERY BYPASS GRAFTING (CABG) x 4 (N/A) TRANSESOPHAGEAL ECHOCARDIOGRAM (TEE) (N/A) Subjective: Doing well after CABG for nonstemi, acute coronary syndrome nsr Objective: Vital signs in last 24 hours: Temp:  [97 F (36.1 C)-100.2 F (37.9 C)] 99 F (37.2 C) (10/23 0700) Pulse Rate:  [69-99] 87 (10/23 0700) Cardiac Rhythm: Normal sinus rhythm (10/22 2000) Resp:  [9-20] 12 (10/23 0700) BP: (71-108)/(44-77) 89/53 (10/23 0700) SpO2:  [88 %-100 %] 97 % (10/23 0700) Arterial Line BP: (82-152)/(48-86) 102/51 (10/23 0700) FiO2 (%):  [40 %-50 %] 40 % (10/22 1704) Weight:  [188 lb 0.8 oz (85.3 kg)] 188 lb 0.8 oz (85.3 kg) (10/23 0600)  Hemodynamic parameters for last 24 hours: PAP: (23-56)/(7-38) 29/10 CO:  [3.5 L/min-7.5 L/min] 6.8 L/min CI:  [1.7 L/min/m2-3.9 L/min/m2] 3.5 L/min/m2  Intake/Output from previous day: 10/22 0701 - 10/23 0700 In: 5067.1 [I.V.:3261.1; Blood:756; IV Piggyback:1050] Out: 2870 [Urine:2200; Blood:400; Chest Tube:270] Intake/Output this shift: No intake/output data recorded.       Exam    General- alert and comfortable   Lungs- clear without rales, wheezes   Cor- regular rate and rhythm, no murmur , gallop   Abdomen- soft, non-tender   Extremities - warm, non-tender, minimal edema   Neuro- oriented, appropriate, no focal weakness   Lab Results:  Recent Labs  05/19/17 2016 05/19/17 2022 05/20/17 0433  WBC 12.1*  --  14.0*  HGB 9.6* 8.8* 9.2*  HCT 28.1* 26.0* 27.5*  PLT 181  --  166   BMET:  Recent Labs  05/19/17 0425  05/19/17 2022 05/20/17 0433  NA 135  < > 138 133*  K 4.0  < > 4.3 4.2  CL 101  < > 103 103  CO2 26  --   --  21*  GLUCOSE 105*  < > 146* 125*  BUN 17  < > 15 14  CREATININE 1.13  < > 0.90 1.09  CALCIUM 9.0  --   --  7.9*  < > = values in this interval not displayed.  PT/INR:  Recent Labs  05/19/17 1404  LABPROT 15.0  INR 1.19   ABG    Component Value Date/Time   PHART 7.348 (L) 05/19/2017 1841   HCO3 22.4 05/19/2017 1841   TCO2 23 05/19/2017 2022   ACIDBASEDEF 3.0 (H) 05/19/2017 1841   O2SAT 97.0 05/19/2017 1841   CBG (last 3)   Recent Labs  05/20/17 0044 05/20/17 0142 05/20/17 0442  GLUCAP 85 86 120*    Assessment/Plan: S/P Procedure(s) (LRB): CORONARY ARTERY BYPASS GRAFTING (CABG) x 4 (N/A) TRANSESOPHAGEAL ECHOCARDIOGRAM (TEE) (N/A) Mobilize Diuresis Diabetes control d/c tubes/lines See progression orders Start plavix for ACS preop  LOS: 7 days    Jason Lewis 05/20/2017

## 2017-05-20 NOTE — Plan of Care (Signed)
Problem: Respiratory: Goal: Ability to tolerate decreased levels of ventilator support will improve Outcome: Completed/Met Date Met: 05/20/17 Pt extubated  Problem: Pain Management: Goal: Pain level will decrease Outcome: Progressing Mannageded with po pills   

## 2017-05-20 NOTE — Progress Notes (Signed)
T[facility] BRIEF SICU PROGRESS NOTE  1 Day Post-Op  S/P Procedure(s) (LRB): CORONARY ARTERY BYPASS GRAFTING (CABG) x 4 (N/A) TRANSESOPHAGEAL ECHOCARDIOGRAM (TEE) (N/A)   Stable day NSR w/ stable BP Breathing comfortably on room air UOP adequate Labs ok  Plan: Continue current plan  Rexene Alberts, MD 05/20/2017 8:07 PM

## 2017-05-20 NOTE — Op Note (Signed)
NAME:  Jason Lewis, Jason Lewis NO.:  [id number]  MEDICAL RECORD NO.:  [id number]  LOCATION:  [facility]                         FACILITY:  [facility]  PHYSICIAN:  Ivin Poot, M.D.  DATE OF BIRTH:  [birth year]  DATE OF PROCEDURE:  05/19/2017 DATE OF DISCHARGE:                              OPERATIVE REPORT   OPERATIONS: 1. Coronary artery bypass grafting x4 (left internal mammary artery to     left anterior descending, saphenous vein graft to ramus     intermediate, saphenous vein graft to second diagonal, saphenous     vein graft to posterior descending). 2. Endoscopic harvest of right leg greater saphenous vein.  SURGEON:  Ivin Poot, M.D.  ASSISTANT:  John Giovanni, P.A.-C.  PREOPERATIVE DIAGNOSES:  Severe three-vessel coronary artery disease, unstable angina, non-ST elevation myocardial infarction.  POSTOPERATIVE DIAGNOSES:  Severe three-vessel coronary artery disease, unstable angina, non-ST elevation myocardial infarction.  CLINICAL NOTE:  The patient is a 66 year old hypertensive male, nonsmoker, presented with chest pain and positive cardiac enzymes. Cardiac catheterization demonstrated significant 3-vessel coronary artery disease.  His cardiologist felt that surgical coronary revascularization would be his best long-term therapy due to significant LAD diagonal disease.  I discussed the procedure of CABG with the patient and his wife including the use of general anesthesia and cardiopulmonary bypass, the location of the surgical incisions, and the expected postoperative recovery.  I discussed with the patient the risks to him of the operation including risks of stroke, MI, bleeding, blood transfusion requirement, infection, postoperative organ failure, postoperative arrhythmias, infection, and death.  After reviewing these issues, he demonstrated his understanding and agreed to proceed with surgery under what I felt was an informed consent.  OPERATIVE  FINDINGS: 1. LAD was deeply into myocardial. 2. The diagonal was severely and diffusely diseased, suboptimal, but     adequate target. 3. Distal RCA posterior descending had a 75% stenosis. 4. No packed cell transfusions required for the surgery.  DESCRIPTION OF PROCEDURE:  The patient was brought to the operating room and placed supine on the operating table.  General anesthesia was induced under invasive hemodynamic monitoring.  The chest, abdomen, and legs were prepped with Betadine and draped as a sterile field.  A sternal incision was made as the saphenous vein was harvested endoscopically from the right leg using endoscopic technique.  After the left mammary artery was harvested, the sternal retractor was placed and the pericardium was opened and suspended.  Pursestrings were placed in the ascending aorta and right atrium.  Heparin was administered and when the ACT was documented as being therapeutic, the patient was cannulated and placed on cardiopulmonary bypass.  The coronaries were identified for grafting.  It was difficult to locate the coronaries due to intramyocardial location of the LAD and ramus.  The mammary artery and vein grafts were prepared for the distal anastomoses and cardioplegia cannulas were placed both antegrade and retrograde cold blood cardioplegia.  The patient was cooled to 32 degrees and an aortic crossclamp was applied.  One liter of cold blood cardioplegia was delivered in split doses between the antegrade aortic and retrograde coronary sinus catheters.  There was good cardioplegic arrest  and septal temperature dropped to less than 12 degrees.  Cardioplegia was delivered every 20 minutes.  The distal coronary anastomoses were performed.  The first distal anastomosis was the posterior descending.  There was a plaque in the midvessel with stenosis on the arteriogram and a reverse saphenous vein was sewn distal to this using running 7-0 Prolene with good  flow through the graft.  The second distal anastomosis was the ramus intermediate branch of the left coronary.  This was a 1.4 to 1.5-mm vessel.  There was approximately 80% stenosis.  A reverse saphenous vein was sewn end-to- side with running 7-0 Prolene with good flow through the graft. Cardioplegia was redosed.  The third distal anastomosis was to the second diagonal branch of the LAD.  This had a heavily calcified stenosis of approximately 80%.  A reverse saphenous vein was sewn end-to-side with running 7-0 Prolene with good flow through the graft.  Cardioplegia was redosed.  The fourth distal anastomosis was the distal third of the LAD, which was deeply into myocardial.  It was 1.5-mm vessel.  The left IMA pedicle was brought through an opening in the left lateral pericardium and was brought down onto the LAD and sewn end-to-side with a running 8-0 Prolene.  There was good flow through the anastomosis after briefly releasing the bulldog on the mammary pedicle.  The bulldog was reapplied and the pedicle was secured to the epicardium.  Cardioplegia was redosed.  With the crossclamp still in place, 3 proximal vein anastomoses were performed on the ascending aorta using a 4.5-mm punch running 6-0 Prolene.  Prior to tying down the final proximal anastomosis, air was vented from the coronaries with a dose of retrograde warm blood cardioplegia.  The crossclamp was removed.  The vein grafts were de-aired and opened.  Each had good flow and hemostasis was documented at the proximal and distal anastomoses.  The patient was rewarmed and reperfused and cardioplegia cannulas were removed.  Temporary pacing wires were applied.  The lungs were expanded. The ventilator was resumed.  The patient was then weaned off cardiopulmonary bypass without difficulty.  Echo showed good biventricular function.  Cardiac output was normal.  Protamine was administered without adverse reaction.  The  cannulas were removed.  The mediastinum was irrigated with saline.  The superior pericardial fat was closed over the aorta.  Anterior mediastinal and left pleural chest tubes were placed and brought out through separate incisions.  The sternum was closed with wire.  The pectoralis fascia was closed with a running #1 Vicryl.  The subcutaneous and skin layers were closed in running Vicryl and sterile dressings were applied.  Total cardiopulmonary bypass time was 155 minutes.     Ivin Poot, M.D.     PV/MEDQ  D:  05/19/2017  T:  05/20/2017  Job:  [id number]  cc:   Dr. Clarisse Gouge, M.D.

## 2017-05-21 ENCOUNTER — Inpatient Hospital Stay (HOSPITAL_COMMUNITY): Payer: PPO

## 2017-05-21 LAB — TYPE AND SCREEN
ABO/RH(D): O POS
Antibody Screen: NEGATIVE
Unit division: 0
Unit division: 0

## 2017-05-21 LAB — BASIC METABOLIC PANEL
Anion gap: 11 (ref 5–15)
BUN: 20 mg/dL (ref 6–20)
CO2: 25 mmol/L (ref 22–32)
Calcium: 8.4 mg/dL — ABNORMAL LOW (ref 8.9–10.3)
Chloride: 99 mmol/L — ABNORMAL LOW (ref 101–111)
Creatinine, Ser: 1.17 mg/dL (ref 0.61–1.24)
GFR calc Af Amer: >60 mL/min (ref >60)
GFR calc non Af Amer: >60 mL/min (ref >60)
Glucose, Bld: 173 mg/dL — ABNORMAL HIGH (ref 65–99)
Potassium: 3.9 mmol/L (ref 3.5–5.1)
Sodium: 135 mmol/L (ref 135–145)

## 2017-05-21 LAB — GLUCOSE, CAPILLARY
GLUCOSE-CAPILLARY: 168 mg/dL — AB (ref 65–99)
GLUCOSE-CAPILLARY: 135 mg/dL — AB (ref 65–99)
GLUCOSE-CAPILLARY: 109 mg/dL — AB (ref 65–99)
Glucose-Capillary: 131 mg/dL — ABNORMAL HIGH (ref 65–99)
Glucose-Capillary: 122 mg/dL — ABNORMAL HIGH (ref 65–99)

## 2017-05-21 LAB — BPAM RBC
Blood Product Expiration Date: 201811152359
Blood Product Expiration Date: 201811152359
ISSUE DATE / TIME: 201810202147
ISSUE DATE / TIME: 201810202147
Unit Type and Rh: 5100
Unit Type and Rh: 5100

## 2017-05-21 LAB — CBC
HCT: 27.7 % — ABNORMAL LOW (ref 39.0–52.0)
Hemoglobin: 9.2 g/dL — ABNORMAL LOW (ref 13.0–17.0)
MCH: 30.7 pg (ref 26.0–34.0)
MCHC: 33.2 g/dL (ref 30.0–36.0)
MCV: 92.3 fL (ref 78.0–100.0)
Platelets: 178 10*3/uL (ref 150–400)
RBC: 3 MIL/uL — ABNORMAL LOW (ref 4.22–5.81)
RDW: 12.9 % (ref 11.5–15.5)
WBC: 16.5 10*3/uL — ABNORMAL HIGH (ref 4.0–10.5)

## 2017-05-21 MED ORDER — POTASSIUM CHLORIDE CRYS ER 20 MEQ PO TBCR
20.0000 meq | EXTENDED_RELEASE_TABLET | Freq: Every day | ORAL | Status: DC
  Administered 2017-05-21 – 2017-05-24 (×4): 20 meq via ORAL
  Filled 2017-05-21 (×4): qty 1

## 2017-05-21 MED ORDER — FUROSEMIDE 10 MG/ML IJ SOLN
40.0000 mg | Freq: Every day | INTRAMUSCULAR | Status: DC
  Administered 2017-05-21 – 2017-05-23 (×3): 40 mg via INTRAVENOUS
  Filled 2017-05-21 (×4): qty 4

## 2017-05-21 MED ORDER — MAGNESIUM HYDROXIDE 400 MG/5ML PO SUSP: 30.0000 mL | Freq: Every day | ORAL | Status: DC | PRN

## 2017-05-21 MED ORDER — SODIUM CHLORIDE 0.9% FLUSH
3.0000 mL | Freq: Two times a day (BID) | INTRAVENOUS | Status: DC
  Administered 2017-05-21 – 2017-05-23 (×4): 3 mL via INTRAVENOUS

## 2017-05-21 MED ORDER — SODIUM CHLORIDE 0.9 % IV SOLN: 250.0000 mL | INTRAVENOUS | Status: DC | PRN

## 2017-05-21 MED ORDER — INSULIN ASPART 100 UNIT/ML ~~LOC~~ SOLN
0.0000 [IU] | Freq: Three times a day (TID) | SUBCUTANEOUS | Status: DC
  Administered 2017-05-21 – 2017-05-23 (×6): 2 [IU] via SUBCUTANEOUS

## 2017-05-21 MED ORDER — MOVING RIGHT ALONG BOOK
Freq: Once | Status: AC
  Administered 2017-05-21: 08:00:00
  Filled 2017-05-21: qty 1

## 2017-05-21 MED ORDER — SODIUM CHLORIDE 0.9% FLUSH: 3.0000 mL | INTRAVENOUS | Status: DC | PRN

## 2017-05-21 NOTE — Discharge Instructions (Signed)
1. Wash incisions with soap and water daily... May shower, no tub baths or swimming 2. No driving for 4 weeks post hospital discharge and as long as you are taking narcotic pain medication 3. Activity- up as tolerated, need to ambulate at least 3 times per day 4. Sternal Precautions- no lifting, pulling, pushing with arms over full gallon of milk which is 8 lbs for 6 weeks 5. Contact office should you develop questions or concerns   Coronary Artery Bypass Grafting, Care After This sheet gives you information about how to care for yourself after your procedure. Your health care provider may also give you more specific instructions. If you have problems or questions, contact your health care provider. What can I expect after the procedure? After the procedure, it is common to have:  Nausea and a lack of appetite.  Constipation.  Weakness and fatigue.  Depression or irritability.  Pain or discomfort in your incision areas.  Follow these instructions at home: Medicines  Take over-the-counter and prescription medicines only as told by your health care provider. Do not stop taking medicines or start any new medicines without approval from your health care provider.  If you were prescribed an antibiotic medicine, take it as told by your health care provider. Do not stop taking the antibiotic even if you start to feel better.  Do not drive or use heavy machinery while taking prescription pain medicine. Incision care  Follow instructions from your health care provider about how to take care of your incisions. Make sure you: ? Wash your hands with soap and water before you change your bandage (dressing). If soap and water are not available, use hand sanitizer. ? Change your dressing as told by your health care provider. ? Leave stitches (sutures), skin glue, or adhesive strips in place. These skin closures may need to stay in place for 2 weeks or longer. If adhesive strip edges start to  loosen and curl up, you may trim the loose edges. Do not remove adhesive strips completely unless your health care provider tells you to do that.  Keep incision areas clean, dry, and protected.  Check your incision areas every day for signs of infection. Check for: ? More redness, swelling, or pain. ? More fluid or blood. ? Warmth. ? Pus or a bad smell.  If incisions were made in your legs: ? Avoid crossing your legs. ? Avoid sitting for long periods of time. Change positions every 30 minutes. ? Raise (elevate) your legs when you are sitting. Bathing  Do not take baths, swim, or use a hot tub until your health care provider approves.  Only take sponge baths. Pat the incisions dry. Do not rub incisions with a washcloth or towel.  Ask your health care provider when you can shower. Eating and drinking  Eat foods that are high in fiber, such as raw fruits and vegetables, whole grains, beans, and nuts. Meats should be lean cut. Avoid canned, processed, and fried foods. This can help prevent constipation and is a recommended part of a heart-healthy diet.  Drink enough fluid to keep your urine clear or pale yellow.  Limit alcohol intake to no more than 1 drink a day for nonpregnant women and 2 drinks a day for men. One drink equals 12 oz of beer, 5 oz of wine, or 1 oz of hard liquor. Activity  Rest and limit your activity as told by your health care provider. You may be instructed to: ? Stop any activity right  away if you have chest pain, shortness of breath, irregular heartbeats, or dizziness. Get help right away if you have any of these symptoms. ? Move around frequently for short periods or take short walks as directed by your health care provider. Gradually increase your activities. You may need physical therapy or cardiac rehabilitation to help strengthen your muscles and build your endurance. ? Avoid lifting, pushing, or pulling anything that is heavier than 10 lb (4.5 kg) for at  least 6 weeks or as told by your health care provider.  Do not drive until your health care provider approves.  Ask your health care provider when you may return to work.  Ask your health care provider when you may resume sexual activity. General instructions  Do not use any products that contain nicotine or tobacco, such as cigarettes and e-cigarettes. If you need help quitting, ask your health care provider.  Take 2-3 deep breaths every few hours during the day, while you recover. This helps expand your lungs and prevent complications like pneumonia after surgery.  If you were given a device called an incentive spirometer, use it several times a day to practice deep breathing. Support your chest with a pillow or your arms when you take deep breaths or cough.  Wear compression stockings as told by your health care provider. These stockings help to prevent blood clots and reduce swelling in your legs.  Weigh yourself every day. This helps identify if your body is holding (retaining) fluid that may make your heart and lungs work harder.  Keep all follow-up visits as told by your health care provider. This is important. Contact a health care provider if:  You have more redness, swelling, or pain around any incision.  You have more fluid or blood coming from any incision.  Any incision feels warm to the touch.  You have pus or a bad smell coming from any incision  You have a fever.  You have swelling in your ankles or legs.  You have pain in your legs.  You gain 2 lb (0.9 kg) or more a day.  You are nauseous or you vomit.  You have diarrhea. Get help right away if:  You have chest pain that spreads to your jaw or arms.  You are short of breath.  You have a fast or irregular heartbeat.  You notice a "clicking" in your breastbone (sternum) when you move.  You have numbness or weakness in your arms or legs.  You feel dizzy or light-headed. Summary  After the  procedure, it is common to have pain or discomfort in the incision areas.  Do not take baths, swim, or use a hot tub until your health care provider approves.  Gradually increase your activities. You may need physical therapy or cardiac rehabilitation to help strengthen your muscles and build your endurance.  Weigh yourself every day. This helps identify if your body is holding (retaining) fluid that may make your heart and lungs work harder. This information is not intended to replace advice given to you by your health care provider. Make sure you discuss any questions you have with your health care provider. Document Released: 02/01/2005 Document Revised: 06/03/2016 Document Reviewed: 06/03/2016 Elsevier Interactive Patient Education  2018 [employer].   Endoscopic Saphenous Vein Harvesting, Care After Refer to this sheet in the next few weeks. These instructions provide you with information about caring for yourself after your procedure. Your health care provider may also give you more specific instructions. Your  treatment has been planned according to current medical practices, but problems sometimes occur. Call your health care provider if you have any problems or questions after your procedure. What can I expect after the procedure? After the procedure, it is common to have:  Pain.  Bruising.  Swelling.  Numbness.  Follow these instructions at home: Medicine  Take over-the-counter and prescription medicines only as told by your health care provider.  Do not drive or operate heavy machinery while taking prescription pain medicine. Incision care   Follow instructions from your health care provider about how to take care of the cut made during surgery (incision). Make sure you: ? Wash your hands with soap and water before you change your bandage (dressing). If soap and water are not available, use hand sanitizer. ? Change your dressing as told by your health care  provider. ? Leave stitches (sutures), skin glue, or adhesive strips in place. These skin closures may need to be in place for 2 weeks or longer. If adhesive strip edges start to loosen and curl up, you may trim the loose edges. Do not remove adhesive strips completely unless your health care provider tells you to do that.  Check your incision area every day for signs of infection. Check for: ? More redness, swelling, or pain. ? More fluid or blood. ? Warmth. ? Pus or a bad smell. General instructions  Raise (elevate) your legs above the level of your heart while you are sitting or lying down.  Do any exercises your health care providers have given you. These may include deep breathing, coughing, and walking exercises.  Do not shower, take baths, swim, or use a hot tub unless told by your health care provider.  Wear your elastic stocking if told by your health care provider.  Keep all follow-up visits as told by your health care provider. This is important. Contact a health care provider if:  Medicine does not help your pain.  Your pain gets worse.  You have new leg bruises or your leg bruises get bigger.  You have a fever.  Your leg feels numb.  You have more redness, swelling, or pain around your incision.  You have more fluid or blood coming from your incision.  Your incision feels warm to the touch.  You have pus or a bad smell coming from your incision. Get help right away if:  Your pain is severe.  You develop pain, tenderness, warmth, redness, or swelling in any part of your leg.  You have chest pain.  You have trouble breathing. This information is not intended to replace advice given to you by your health care provider. Make sure you discuss any questions you have with your health care provider. Document Released: 03/27/2011 Document Revised: 12/21/2015 Document Reviewed: 05/29/2015 Elsevier Interactive Patient Education  2018 [employer].

## 2017-05-21 NOTE — Discharge Summary (Signed)
Physician Discharge Summary  Patient ID: Jason Lewis MRN: [id number] DOB/AGE: 11-27-1950 66 y.o.  Admit date: 05/12/2017 Discharge date: 05/24/2017  Admission Diagnoses:  Patient Active Problem List   Diagnosis Date Noted  . NSTEMI (non-ST elevated myocardial infarction) ([place]) 05/13/2017  . Coronary artery disease   . Non-ST elevation (NSTEMI) myocardial infarction ([place])   . Chest pain 05/12/2017   Discharge Diagnoses:   Patient Active Problem List   Diagnosis Date Noted  . 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 NSTEMI (non-ST elevated myocardial infarction) ([place]) 05/13/2017  . Coronary artery disease   . Non-ST elevation (NSTEMI) myocardial infarction ([place])   . Chest pain 05/12/2017   Discharged Condition: good  History of Present Illness:  Jason Lewis is a 66 yo male who presented to the Emergency Department with complaints of chest pain, left arm pain, and shortness of breath.  This had been occurring for the past week, lasting about 15 min each time.  However, this morning the episode occurring while working prompting him to present for evaluation.  EKG was obtained and did not showed evidence of EKG changes.  His troponin level was minimally positive after several hours of observation, ultimately ruling him in for NSTEMI.  He was admitted for further care.        Hospital Course:   He was treated with IV Heparin for his NSTEMI.  Cardiology consult was obtained and recommended patient undergo cardiac catheterization.  This was done on 05/13/2017 and showed multivessel CAD that was felt to be best treated with coronary bypass procedure.  [facility] consult was requested and he was evaluated by Dr. Prescott Gum who was in agreement surgery would be indicated.  Patient did request information about PCI which was provided.  He ultimately was agreeable to bypass surgery.  The risks and benefits of the procedure were explained to the patient and he was agreeable to proceed.  He was taken to the operating  room on 05/19/2017.  He underwent CABG x 4 utilizing LIMA ot LAD, SVG to Ramus, SVG to Diagonal #2, and SVG to PDA.  He also underwent endoscopic harvest of greater saphenous vein from his right leg.  He tolerated the procedure without difficulty and was taken to the SICU in stable condition.  He was extubated the evening of surgery.  During his stay in the SICU he was weaned off all drip as tolerated.  His chest tubes and arterial lines were removed on POD #2.  He was started on Plavix for Acute coronary syndrome.  He was felt medically stable for transfer to the telemetry unit on 05/22/2017.  He continued to progress. He ambulated in the halls and was weaned to room air.He has progressed in a unremarkable  manner and is stable for discharge  Significant Diagnostic Studies: angiography:    Mid RCA lesion, 60 %stenosed.    Non-ST elevation myocardial infarction presentation without definite culprit identified.  Widely patent left main.  Heavily calcified and diffusely diseased mid LAD and second diagonal containing 80 and 70% stenoses respectively, forming a [place] 0, 1,1 bifurcation stenosis. A small third diagonal contains 80%proximal narrowing.  Significant circumflex disease with 90-95% stenosis in the moderate sized first obtuse marginal and 70% obstruction in the continuation of the circumflex beyond the origin of the 1st marginal branch.  The RCA contains mid-vessel intraluminal thrombus with obstruction up to 50% and mid PDA 75% obstruction. Distal left ventricular branch 60-70% narrowing.  Overall normal LV function. EF 60%. LVEDP is  normal, with no evidence of diastolic heart failure.  Treatments: surgery:   1. Coronary artery bypass grafting x4 (left internal mammary artery to left anterior descending, saphenous vein graft to ramus intermediate, saphenous vein graft to second diagonal, saphenous vein graft to posterior descending). 2. Endoscopic harvest of right leg greater  saphenous vein.  Disposition: Home  Discharge Medications:  The patient has been discharged on:   1.Beta Blocker:  Yes [ x  ]                              No   [   ]                              If No, reason:  2.Ace Inhibitor/ARB: Yes [   ]                                     No  [n    ]                                     If No, reason:BP too low to initiate currently  3.Statin:   Yes [ x  ]                  No  [   ]                  If No, reason:  4.Ecasa:  Yes  [ x  ]                  No   [   ]                  If No, reason:     Discharge Instructions    Amb Referral to Cardiac Rehabilitation    Complete by:  As directed    Diagnosis:  CABG   CABG X ___:  4     Allergies as of 05/24/2017   No Known Allergies     Medication List    TAKE these medications   aspirin 81 MG EC tablet Take 1 tablet (81 mg total) by mouth daily.   atorvastatin 80 MG tablet Commonly known as:  LIPITOR Take 1 tablet (80 mg total) by mouth daily at 6 PM.   clopidogrel 75 MG tablet Commonly known as:  PLAVIX Take 1 tablet (75 mg total) by mouth daily.   metoprolol tartrate 25 MG tablet Commonly known as:  LOPRESSOR Take 1 tablet (25 mg total) by mouth 2 (two) times daily.   oxyCODONE 5 MG immediate release tablet Commonly known as:  Oxy IR/ROXICODONE Take 1-2 tablets (5-10 mg total) by mouth every 6 (six) hours as needed for severe pain.      Follow-up Information    Ivin Poot, MD Follow up on 06/25/2017.   Specialty:  Cardiothoracic Surgery Why:  Appoointment is at 2:00, please get CXR at 1:30 at [facility] located on first floor of our office building Contact information: [place] [address] [phone]        [name], PA-C Follow up on 06/03/2017.   Specialties:  Cardiology, Radiology Why:  Appointment is at 11:00 Contact information: [address] [place]  [address] [phone]        Deland Pretty, MD. Call in 1 day(s).   Specialty:  Internal Medicine Contact information: [address] [place] [address] [phone]           Signed: John Giovanni 05/24/2017, 8:38 AM

## 2017-05-21 NOTE — Progress Notes (Signed)
Pt is complaining of just being "uncomfortable."  4/10 pain around surgical area but also just generalized feeling of uncomfortable.  Pt just ambulated 200 feet and felt "OK."  Will continue to monitor closely and update as needed.

## 2017-05-21 NOTE — Progress Notes (Signed)
Patient ID: Jason Lewis, male   DOB: 03-03-1951, 66 y.o.   MRN: [id number] T[facility] Evening Rounds:  Hemodynamically stable in sinus rhythm sats 97% Diuresing well Ambulated today

## 2017-05-21 NOTE — Progress Notes (Signed)
RT evaluated pt per order. Patient BS are clear in upper lobes and slightly diminished in lower lobes. Pt on 4LNC, sats are 93-94%, chest expansion symmetrical, regular unlabored breathing pattern. No PRN treatments or other breathing treatments needed at this time. Patient vitals are stable, pt is resting comfortably and in no apparent distress, IS by bedside, RT will continue to monitor.

## 2017-05-21 NOTE — Progress Notes (Signed)
2 Days Post-Op Procedure(s) (LRB): CORONARY ARTERY BYPASS GRAFTING (CABG) x 4 (N/A) TRANSESOPHAGEAL ECHOCARDIOGRAM (TEE) (N/A) Subjective: Progressing after nonstemi then CABGx4 Increased chest tube output- leave drains for now Objective: Vital signs in last 24 hours: Temp:  [97.9 F (36.6 C)-99.1 F (37.3 C)] 98.3 F (36.8 C) (10/24 0300) Pulse Rate:  [72-85] 82 (10/24 0700) Cardiac Rhythm: Normal sinus rhythm (10/23 2000) Resp:  [8-22] 12 (10/24 0700) BP: (87-114)/(42-67) 109/56 (10/24 0700) SpO2:  [79 %-99 %] 89 % (10/24 0700) Arterial Line BP: (117-122)/(52-66) 122/56 (10/23 1000) Weight:  [184 lb 15.5 oz (83.9 kg)] 184 lb 15.5 oz (83.9 kg) (10/24 0400)  Hemodynamic parameters for last 24 hours: PAP: (37-45)/(18-23) 42/18  Intake/Output from previous day: 10/23 0701 - 10/24 0700 In: 870 [P.O.:540; I.V.:230; IV Piggyback:100] Out: 2005 [Urine:1585; Chest Tube:420] Intake/Output this shift: No intake/output data recorded.       Exam    General- alert and comfortable   Lungs- clear without rales, wheezes   Cor- regular rate and rhythm, no murmur , gallop   Abdomen- soft, non-tender   Extremities - warm, non-tender, minimal edema   Neuro- oriented, appropriate, no focal weakness    Lab Results:  Recent Labs  05/20/17 1515 05/20/17 1521 05/21/17 0401  WBC 16.8*  --  16.5*  HGB 9.1* 8.5* 9.2*  HCT 27.6* 25.0* 27.7*  PLT 163  --  178   BMET:  Recent Labs  05/20/17 0433  05/20/17 1521 05/21/17 0401  NA 133*  --  134* 135  K 4.2  --  4.6 3.9  CL 103  --  100* 99*  CO2 21*  --   --  25  GLUCOSE 125*  --  154* 173*  BUN 14  --  19 20  CREATININE 1.09  < > 1.10 1.17  CALCIUM 7.9*  --   --  8.4*  < > = values in this interval not displayed.  PT/INR:  Recent Labs  05/19/17 1404  LABPROT 15.0  INR 1.19   ABG    Component Value Date/Time   PHART 7.348 (L) 05/19/2017 1841   HCO3 22.4 05/19/2017 1841   TCO2 26 05/20/2017 1521   ACIDBASEDEF 3.0 (H)  05/19/2017 1841   O2SAT 97.0 05/19/2017 1841   CBG (last 3)   Recent Labs  05/20/17 1956 05/20/17 2345 05/21/17 0410  GLUCAP 142* 138* 168*    Assessment/Plan: S/P Procedure(s) (LRB): CORONARY ARTERY BYPASS GRAFTING (CABG) x 4 (N/A) TRANSESOPHAGEAL ECHOCARDIOGRAM (TEE) (N/A) Mobilize Diuresis Diabetes control Plan for transfer to step-down: see transfer orders   LOS: 8 days    Jason Lewis 05/21/2017

## 2017-05-22 ENCOUNTER — Inpatient Hospital Stay (HOSPITAL_COMMUNITY): Payer: PPO

## 2017-05-22 DIAGNOSIS — Z951 Presence of aortocoronary bypass graft: Secondary | ICD-10-CM

## 2017-05-22 LAB — CBC
HCT: 27.3 % — ABNORMAL LOW (ref 39.0–52.0)
Hemoglobin: 8.9 g/dL — ABNORMAL LOW (ref 13.0–17.0)
MCH: 30.1 pg (ref 26.0–34.0)
MCHC: 32.6 g/dL (ref 30.0–36.0)
MCV: 92.2 fL (ref 78.0–100.0)
Platelets: 198 10*3/uL (ref 150–400)
RBC: 2.96 MIL/uL — ABNORMAL LOW (ref 4.22–5.81)
RDW: 12.7 % (ref 11.5–15.5)
WBC: 12.5 10*3/uL — ABNORMAL HIGH (ref 4.0–10.5)

## 2017-05-22 LAB — GLUCOSE, CAPILLARY
Glucose-Capillary: 152 mg/dL — ABNORMAL HIGH (ref 65–99)
Glucose-Capillary: 120 mg/dL — ABNORMAL HIGH (ref 65–99)

## 2017-05-22 LAB — BASIC METABOLIC PANEL
Anion gap: 7 (ref 5–15)
BUN: 16 mg/dL (ref 6–20)
CO2: 30 mmol/L (ref 22–32)
Calcium: 8.4 mg/dL — ABNORMAL LOW (ref 8.9–10.3)
Chloride: 100 mmol/L — ABNORMAL LOW (ref 101–111)
Creatinine, Ser: 0.96 mg/dL (ref 0.61–1.24)
GFR calc Af Amer: >60 mL/min (ref >60)
GFR calc non Af Amer: >60 mL/min (ref >60)
Glucose, Bld: 116 mg/dL — ABNORMAL HIGH (ref 65–99)
Potassium: 4.2 mmol/L (ref 3.5–5.1)
Sodium: 137 mmol/L (ref 135–145)

## 2017-05-22 MED ORDER — METOPROLOL TARTRATE 25 MG PO TABS
25.0000 mg | ORAL_TABLET | Freq: Two times a day (BID) | ORAL | Status: DC
  Administered 2017-05-22 – 2017-05-24 (×5): 25 mg via ORAL
  Filled 2017-05-22 (×5): qty 1

## 2017-05-22 NOTE — Progress Notes (Signed)
CARDIAC REHAB PHASE I   PRE:  Rate/Rhythm: 92 SR    BP: sitting 110/78    SaO2: 92 RA   MODE:  Ambulation: 150 ft   POST:  Rate/Rhythm: 103 ST     BP: sitting 125/74     SaO2: 93 RA  Pt walked 150 ft with no complaints of CP or SOB. Pt was a little unsteady at beginning of walk. Pt needed moderate assistance getting out of bed. Pt used walker during walk. He did not want to continue walking once he saw his room. Pt returned to recliner with call bell within reach.   [place], ACSM 05/22/2017 2:42 PM

## 2017-05-22 NOTE — Progress Notes (Signed)
Pt arrived to the floor, alert and oriented in no apparent distress. He denies pain. Pt placed on monitor , CCMD notified CHG bath done.

## 2017-05-22 NOTE — Progress Notes (Signed)
      [address]       [place] [address]             [phone]      3 Days Post-Op Procedure(s) (LRB): CORONARY ARTERY BYPASS GRAFTING (CABG) x 4 (N/A) TRANSESOPHAGEAL ECHOCARDIOGRAM (TEE) (N/A) Subjective: Has nausea at times. Not eating much yet.   Objective: Vital signs in last 24 hours: Temp:  [98.3 F (36.8 C)-99.5 F (37.5 C)] 99.5 F (37.5 C) (10/25 0652) Pulse Rate:  [81-107] 105 (10/25 0652) Cardiac Rhythm: Sinus tachycardia (10/25 0700) Resp:  [12-20] 18 (10/25 0652) BP: (106-142)/(64-85) 142/82 (10/25 0652) SpO2:  [90 %-100 %] 95 % (10/25 0652) Weight:  [179 lb 10.8 oz (81.5 kg)] 179 lb 10.8 oz (81.5 kg) (10/25 3710)     Intake/Output from previous day: 10/24 0701 - 10/25 0700 In: 640 [P.O.:600; I.V.:40] Out: 2140 [Urine:1900; Chest Tube:240] Intake/Output this shift: No intake/output data recorded.  General appearance: alert, cooperative and no distress Heart: sinus tachycardia Lungs: clear to auscultation bilaterally Abdomen: soft, non-tender; bowel sounds normal; no masses,  no organomegaly Extremities: extremities normal, atraumatic, no cyanosis or edema Wound: clean and dry  Lab Results:  Recent Labs  05/21/17 0401 05/22/17 0436  WBC 16.5* 12.5*  HGB 9.2* 8.9*  HCT 27.7* 27.3*  PLT 178 198   BMET:  Recent Labs  05/21/17 0401 05/22/17 0436  NA 135 137  K 3.9 4.2  CL 99* 100*  CO2 25 30  GLUCOSE 173* 116*  BUN 20 16  CREATININE 1.17 0.96  CALCIUM 8.4* 8.4*    PT/INR:  Recent Labs  05/19/17 1404  LABPROT 15.0  INR 1.19   ABG    Component Value Date/Time   PHART 7.348 (L) 05/19/2017 1841   HCO3 22.4 05/19/2017 1841   TCO2 26 05/20/2017 1521   ACIDBASEDEF 3.0 (H) 05/19/2017 1841   O2SAT 97.0 05/19/2017 1841   CBG (last 3)   Recent Labs  05/21/17 1143 05/21/17 1604 05/21/17 2216  GLUCAP 135* 122* 109*    Assessment/Plan: S/P Procedure(s) (LRB): CORONARY ARTERY BYPASS GRAFTING (CABG) x 4  (N/A) TRANSESOPHAGEAL ECHOCARDIOGRAM (TEE) (N/A)  1. CV-NSR to ST rate in the 100s. BP well controlled. Continue Lopressor, Lipitor, and ASA. Daily Plavix.  2. Pulm-tolerating 2L [place] with good oxygenation. CXR showed mediastinal drainage catheter and left chest tube in stable position. No pneumothorax. Persistent left base infiltrate and atelectasis with elevation of the left hemidiaphragm. 253ml/24 hours recorded out of chest tube.  3. Renal-creatinine 0.96, electrolytes okay. Continue diuretics 4. H and H-stable, expected acute blood loss anemia 5. Endo-blood glucose level well controlled 6. Pain control-oxycodone and tramadol.   Plan: Work on ambulation today. Can probably get out remaining chest tube. Work on increasing oral intake and pain control. Encourage incentive spirometer. Continue diuretics for fluid overload.     LOS: 9 days    Elgie Collard 05/22/2017

## 2017-05-22 NOTE — Progress Notes (Signed)
CT x2 removed per order and per protocol. Pt tolerated fair. Pt in chair. Wife at bedside. Call bell and phone within reach. Will continue to monitor.

## 2017-05-23 ENCOUNTER — Inpatient Hospital Stay (HOSPITAL_COMMUNITY): Payer: PPO

## 2017-05-23 LAB — BASIC METABOLIC PANEL
Anion gap: 8 (ref 5–15)
BUN: 16 mg/dL (ref 6–20)
CO2: 30 mmol/L (ref 22–32)
Calcium: 8.6 mg/dL — ABNORMAL LOW (ref 8.9–10.3)
Chloride: 98 mmol/L — ABNORMAL LOW (ref 101–111)
Creatinine, Ser: 0.97 mg/dL (ref 0.61–1.24)
GFR calc Af Amer: >60 mL/min (ref >60)
GFR calc non Af Amer: >60 mL/min (ref >60)
Glucose, Bld: 126 mg/dL — ABNORMAL HIGH (ref 65–99)
Potassium: 3.7 mmol/L (ref 3.5–5.1)
Sodium: 136 mmol/L (ref 135–145)

## 2017-05-23 LAB — CBC
HCT: 26.1 % — ABNORMAL LOW (ref 39.0–52.0)
Hemoglobin: 8.7 g/dL — ABNORMAL LOW (ref 13.0–17.0)
MCH: 30.4 pg (ref 26.0–34.0)
MCHC: 33.3 g/dL (ref 30.0–36.0)
MCV: 91.3 fL (ref 78.0–100.0)
Platelets: 250 10*3/uL (ref 150–400)
RBC: 2.86 MIL/uL — ABNORMAL LOW (ref 4.22–5.81)
RDW: 12.5 % (ref 11.5–15.5)
WBC: 10.6 10*3/uL — ABNORMAL HIGH (ref 4.0–10.5)

## 2017-05-23 LAB — GLUCOSE, CAPILLARY
GLUCOSE-CAPILLARY: 133 mg/dL — AB (ref 65–99)
Glucose-Capillary: 120 mg/dL — ABNORMAL HIGH (ref 65–99)
Glucose-Capillary: 110 mg/dL — ABNORMAL HIGH (ref 65–99)

## 2017-05-23 MED ORDER — DIPHENHYDRAMINE HCL 25 MG PO CAPS
25.0000 mg | ORAL_CAPSULE | Freq: Every evening | ORAL | Status: DC | PRN
  Administered 2017-05-23: 25 mg via ORAL
  Filled 2017-05-23: qty 1

## 2017-05-23 NOTE — Progress Notes (Signed)
      [address]       ,[place] [address]             [phone]      4 Days Post-Op Procedure(s) (LRB): CORONARY ARTERY BYPASS GRAFTING (CABG) x 4 (N/A) TRANSESOPHAGEAL ECHOCARDIOGRAM (TEE) (N/A) Subjective: Sleepy this morning. States that he is having trouble getting comfortable at night.   Objective: Vital signs in last 24 hours: Temp:  [97.8 F (36.6 C)-99.8 F (37.7 C)] 97.8 F (36.6 C) (10/26 0424) Pulse Rate:  [80-100] 91 (10/26 0424) Cardiac Rhythm: Sinus tachycardia (10/26 0200) Resp:  [15-23] 15 (10/26 0424) BP: (91-116)/(60-75) 116/60 (10/26 0424) SpO2:  [93 %-100 %] 96 % (10/26 0424) Weight:  [163 lb 9.3 oz (74.2 kg)] 163 lb 9.3 oz (74.2 kg) (10/26 0424)     Intake/Output from previous day: 10/25 0701 - 10/26 0700 In: 990 [P.O.:990] Out: 100 [Urine:100] Intake/Output this shift: No intake/output data recorded.  General appearance: alert, cooperative and no distress Heart: regular rate and rhythm, S1, S2 normal, no murmur, click, rub or gallop Lungs: clear to auscultation bilaterally Abdomen: soft, non-tender; bowel sounds normal; no masses,  no organomegaly Extremities: extremities normal, atraumatic, no cyanosis or edema Wound: clean and dry. EVH site c/d/i  Lab Results:  Recent Labs  05/22/17 0436 05/23/17 0252  WBC 12.5* 10.6*  HGB 8.9* 8.7*  HCT 27.3* 26.1*  PLT 198 250   BMET:  Recent Labs  05/22/17 0436 05/23/17 0252  NA 137 136  K 4.2 3.7  CL 100* 98*  CO2 30 30  GLUCOSE 116* 126*  BUN 16 16  CREATININE 0.96 0.97  CALCIUM 8.4* 8.6*    PT/INR: No results for input(s): LABPROT, INR in the last 72 hours. ABG    Component Value Date/Time   PHART 7.348 (L) 05/19/2017 1841   HCO3 22.4 05/19/2017 1841   TCO2 26 05/20/2017 1521   ACIDBASEDEF 3.0 (H) 05/19/2017 1841   O2SAT 97.0 05/19/2017 1841   CBG (last 3)   Recent Labs  05/21/17 2216 05/22/17 1219 05/22/17 1634  GLUCAP 109* 152* 120*     Assessment/Plan: S/P Procedure(s) (LRB): CORONARY ARTERY BYPASS GRAFTING (CABG) x 4 (N/A) TRANSESOPHAGEAL ECHOCARDIOGRAM (TEE) (N/A)  1. CV-NSR in the 90s. BP well controlled. Continue Lopressor, Lipitor, and ASA. Daily Plavix.  2. Pulm-tolerating room air with good oxygenation. All chest tubes removed.  CXR this morning without obvious pneumothorax, no pleural effusion.  3. Renal-creatinine 0.97, electrolytes okay. Continue diuretics. Weight continues to trend down. 4. H and H-stable, expected acute blood loss anemia 5. GI-nausea, poor oral intake.  6. Endo-blood glucose level well controlled 7. Pain control-oxycodone and tramadol.  Plan: only was able to walk 150 ft yesterday. Work on ambulation today. Remove EPW. Work on increasing oral intake. Benadryl PRN for insomnia.   LOS: 10 days    Jason Lewis 05/23/2017

## 2017-05-23 NOTE — Progress Notes (Signed)
EPW removed per protocol. VSS. No telemetry changes. Pt and wife verbalized understanding of bedrest until 1055.   Fritz Pickerel, RN

## 2017-05-23 NOTE — Progress Notes (Signed)
CARDIAC REHAB PHASE I   PRE:  Rate/Rhythm: up walking independently    BP: sitting 120/77    SaO2:   MODE:  Ambulation: 470 ft   POST:  Rate/Rhythm: 102 ST    BP: sitting 117/80     SaO2: 95 RA  Found pt coming out of his room with RW and wife and joined him. Pt steady, good pace. No c/o. He walked last 150 ft without RW and continued to be steady therefore will not need RW at home. To recliner, VSS. Ed completed with pt and wife with good reception. Will refer to [place]. Pt is generally restless and will walk independently x3 more today. [place], ACSM 05/23/2017 9:49 AM

## 2017-05-24 LAB — GLUCOSE, CAPILLARY: Glucose-Capillary: 97 mg/dL (ref 65–99)

## 2017-05-24 MED ORDER — METOPROLOL TARTRATE 25 MG PO TABS: 25.0000 mg | ORAL_TABLET | Freq: Two times a day (BID) | ORAL | 1 refills | Status: DC

## 2017-05-24 MED ORDER — ASPIRIN 81 MG PO TBEC: 81.0000 mg | DELAYED_RELEASE_TABLET | Freq: Every day | ORAL | Status: DC

## 2017-05-24 MED ORDER — OXYCODONE HCL 5 MG PO TABS: 5.0000 mg | ORAL_TABLET | Freq: Four times a day (QID) | ORAL | 0 refills | Status: DC | PRN

## 2017-05-24 MED ORDER — ATORVASTATIN CALCIUM 80 MG PO TABS: 80.0000 mg | ORAL_TABLET | Freq: Every day | ORAL | 1 refills | Status: DC

## 2017-05-24 MED ORDER — CLOPIDOGREL BISULFATE 75 MG PO TABS: 75.0000 mg | ORAL_TABLET | Freq: Every day | ORAL | 1 refills | Status: DC

## 2017-05-24 NOTE — Care Management Note (Signed)
Case Management Note Previous Note Created by Tomi Bamberger  Patient Details  Name: Jason Lewis MRN: [id number] Date of Birth: 1951-03-21  Subjective/Objective:    From home with wife, pta indep, post op CABG.  He has PCP and medication coverage.  10/23 [place], BSN- dc chest tubes, cont to diurese, start plavix.                  Action/Plan: NCM will follow for dc needs.   Expected Discharge Date:  05/24/17               Expected Discharge Plan:  Home/Self Care  In-House Referral:     Discharge planning Services  CM Consult  Post Acute Care Choice:    Choice offered to:     DME Arranged:    DME Agency:     HH Arranged:    HH Agency:     Status of Service:  In process, will continue to follow  If discussed at Long Length of Stay Meetings, dates discussed:    Additional Comments: Pt discharge today home with wife - wife will provide recommended supervision.  No CM needs determined prior to discharge orders. Maryclare Labrador, RN 05/24/2017, 8:57 AM

## 2017-05-24 NOTE — Progress Notes (Addendum)
[id number] Education reinforced  including risk factor modification, low fat-low cholesterol diet, exercise, and medication compliance. Pt and wife questions answered.  Pt oriented to outpatient cardiac rehab. Pt is ambulating on his own. Pt eager for discharge home. Understanding verbalized Andi Hence, RN, BSN Cardiac Pulmonary Rehab

## 2017-05-24 NOTE — Progress Notes (Signed)
[address]       [place] [address]             [phone]      5 Days Post-Op Procedure(s) (LRB): CORONARY ARTERY BYPASS GRAFTING (CABG) x 4 (N/A) TRANSESOPHAGEAL ECHOCARDIOGRAM (TEE) (N/A) Subjective: Feels better, ambulation improved  Objective: Vital signs in last 24 hours: Temp:  [98 F (36.7 C)-100.1 F (37.8 C)] 99.2 F (37.3 C) (10/27 0538) Pulse Rate:  [71-97] 85 (10/26 2347) Cardiac Rhythm: Normal sinus rhythm (10/27 0800) Resp:  [13-23] 20 (10/27 0538) BP: (98-125)/(48-82) 99/48 (10/26 2347) SpO2:  [93 %-100 %] 95 % (10/27 0538) Weight:  [175 lb 9.6 oz (79.7 kg)] 175 lb 9.6 oz (79.7 kg) (10/27 0534)  Hemodynamic parameters for last 24 hours:    Intake/Output from previous day: 10/26 0701 - 10/27 0700 In: 150 [P.O.:150] Out: 450 [Urine:450] Intake/Output this shift: No intake/output data recorded.  General appearance: alert, cooperative and no distress Heart: regular rate and rhythm Lungs: clear to auscultation bilaterally Abdomen: benign Extremities: no edema Wound: incis healing well  Lab Results:  Recent Labs  05/22/17 0436 05/23/17 0252  WBC 12.5* 10.6*  HGB 8.9* 8.7*  HCT 27.3* 26.1*  PLT 198 250   BMET:  Recent Labs  05/22/17 0436 05/23/17 0252  NA 137 136  K 4.2 3.7  CL 100* 98*  CO2 30 30  GLUCOSE 116* 126*  BUN 16 16  CREATININE 0.96 0.97  CALCIUM 8.4* 8.6*    PT/INR: No results for input(s): LABPROT, INR in the last 72 hours. ABG    Component Value Date/Time   PHART 7.348 (L) 05/19/2017 1841   HCO3 22.4 05/19/2017 1841   TCO2 26 05/20/2017 1521   ACIDBASEDEF 3.0 (H) 05/19/2017 1841   O2SAT 97.0 05/19/2017 1841   CBG (last 3)   Recent Labs  05/23/17 1629 05/23/17 2101 05/24/17 0608  GLUCAP 120* 110* 97    Meds Scheduled Meds: . acetaminophen  1,000 mg Oral Q6H   Or  . acetaminophen (TYLENOL) oral liquid 160 mg/5 mL  1,000 mg Per Tube Q6H  . aspirin EC  81 mg Oral Daily  .  atorvastatin  80 mg Oral q1800  . bisacodyl  10 mg Oral Daily   Or  . bisacodyl  10 mg Rectal Daily  . clopidogrel  75 mg Oral Daily  . docusate sodium  200 mg Oral Daily  . furosemide  40 mg Intravenous Daily  . insulin aspart  0-24 Units Subcutaneous TID AC & HS  . metoCLOPramide (REGLAN) injection  10 mg Intravenous Q6H  . metoprolol tartrate  25 mg Oral BID  . pantoprazole  40 mg Oral Daily  . potassium chloride  20 mEq Oral Daily  . sodium chloride flush  3 mL Intravenous Q12H  . sodium chloride flush  3 mL Intravenous Q12H   Continuous Infusions: . sodium chloride Stopped (05/20/17 0800)  . sodium chloride Stopped (05/21/17 1000)  . sodium chloride Stopped (05/20/17 0800)  . sodium chloride     PRN Meds:.sodium chloride, sodium chloride, diphenhydrAMINE, magnesium hydroxide, metoprolol tartrate, ondansetron (ZOFRAN) IV, oxyCODONE, sodium chloride flush, sodium chloride flush, traMADol  Xrays Dg Chest 2 View  Result Date: 05/23/2017 CLINICAL DATA:  Postop CABG. EXAM: CHEST  2 VIEW COMPARISON:  05/22/2017. FINDINGS: LEFT chest tube has been removed. There is a small LEFT apical pneumothorax, less than 5%. Continued surveillance warranted. LEFT lower lobe subsegmental atelectasis and effusion, not unexpected.  Cardiomegaly. Sequelae of CABG. RIGHT lung essentially clear. IMPRESSION: Small less than 5% LEFT apical pneumothorax following LEFT chest tube removal. Continued surveillance warranted. A call has been placed to the PA. Electronically Signed   By: Staci Righter M.D.   On: 05/23/2017 08:12    Assessment/Plan: S/P Procedure(s) (LRB): CORONARY ARTERY BYPASS GRAFTING (CABG) x 4 (N/A) TRANSESOPHAGEAL ECHOCARDIOGRAM (TEE) (N/A)  1 doing well 2 hemodyn stable in sinus rhythm 3 no new labs 4 sugars adeq controlled- needs good cardiovascular diet management long term 5 stable for d/c   LOS: 11 days    Jason Lewis 05/24/2017

## 2017-05-24 NOTE — Progress Notes (Signed)
Chest tube sutures removed and  Site painted with betadine.   Telemetry removed for discharge.Patient did not receive IV Lasix as order for 10am meds.    IV site had already been removed.   [place], [facility]  Notified about the Lasix. Discharge instructions given to patient and wife, questions answered.  Mervyn Skeeters, RN

## 2017-05-26 ENCOUNTER — Telehealth (HOSPITAL_COMMUNITY): Payer: Self-pay

## 2017-05-26 NOTE — Telephone Encounter (Signed)
Patients insurance is active and benefits verified with [employer] - $15.00, no deductible, out of pocket amount is $3,400/$0 has been met, no co-insurance, and no pre-authorization is required. Reference #[id number]  Patient will be contacted and scheduled after their f/u appt with the cardiologist office upon review by the RN Navigator.

## 2017-06-02 ENCOUNTER — Encounter: Payer: Self-pay | Admitting: Physician Assistant

## 2017-06-02 NOTE — Progress Notes (Signed)
Cardiology Office Note    Date:  06/03/2017  ID:  Jason Lewis, DOB June 07, 1951, MRN [id number] PCP:  Jason Pretty, MD  Cardiologist:  Dr. Radford Lewis   Chief Complaint: f/u bypass  History of Present Illness:  Jason Lewis is a 66 y.o. male with history of recently documented HTN, hyperlipidemia, NSTEMI with CAD s/p CABG, abnormal TSH, abnormal LFTs who presents for post-hospital follow-up.  He was recently admitted with CP and + troponin with peak of 0.21. Cardiac cath 10/16 showed multivessel CAD with preserved EF and normal LVEDP. Echo same day showed EF 55-60%, moderate diastolic dysfunction (grade 2), normal RV, mild pulm HTN. He underwent underwent CABG x 4 utilizing LIMA ot LAD, SVG to Ramus, SVG to Diagonal #2, and SVG to PDA on 05/19/17. Post-hospital course appears fairly uncomplicated. He was started on Plavix due to NSTEMI. Recent labs this admission showed K 3.7, Cr 0.97, Hgb 8.7 (post-CABG; pre-CABG was 14.1); TSH 9.087 but fT4 0.77, LDL 141, AST 77, ALT 102, A1c 5.7.  He returns for follow-up with his wife feeling well. His post-op pain has improved every day. He has no dyspnea, orthopnea, PND, edema, palpitations or syncope. Following BP at home and running normally. He reports mild bruising on his right flank that appears to be resolving. No specific pain to this site. He does not recall any trauma.  Past Medical History:  Diagnosis Date  . CAD in native artery    a.  CABG x 4 utilizing LIMA ot LAD, SVG to Ramus, SVG to Diagonal #2, and SVG to PDA on 05/19/17.  . Chest pain 05/12/2017  . Coronary artery disease   . Essential hypertension   . 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 Hyperglycemia    a. A1C 5.7 in 04/2017.  . Medical history non-contributory   . Non-ST elevation (NSTEMI) myocardial infarction [facility])     Past Surgical History:  Procedure Laterality Date  . HERNIA REPAIR    . KNEE SURGERY Left 2005  . TONSILLECTOMY      Current Medications: Current Meds    Medication Sig  . aspirin EC 81 MG EC tablet Take 1 tablet (81 mg total) by mouth daily.  Marland Kitchen atorvastatin (LIPITOR) 80 MG tablet Take 1 tablet (80 mg total) by mouth daily at 6 PM.  . clopidogrel (PLAVIX) 75 MG tablet Take 1 tablet (75 mg total) by mouth daily.  . metoprolol tartrate (LOPRESSOR) 25 MG tablet Take 1 tablet (25 mg total) by mouth 2 (two) times daily.  Marland Kitchen oxyCODONE (OXY IR/ROXICODONE) 5 MG immediate release tablet Take 1-2 tablets (5-10 mg total) by mouth every 6 (six) hours as needed for severe pain.     Allergies:   Patient has no known allergies.   Social History   Socioeconomic History  . Marital status: Married    Spouse name: None  . Number of children: None  . Years of education: None  . Highest education level: None  Social Needs  . Financial resource strain: None  . Food insecurity - worry: None  . Food insecurity - inability: None  . Transportation needs - medical: None  . Transportation needs - non-medical: None  Occupational History  . None  Tobacco Use  . Smoking status: Never Smoker  . Smokeless tobacco: Never Used  Substance and Sexual Activity  . Alcohol use: Yes    Comment: occ  . Drug use: No  . Sexual activity: None  Other Topics Concern  . None  Social History Narrative  . None     Family History:  Family History  Problem Relation Age of Onset  . Angina Father   . Coronary artery disease Brother        s/p CABG   ROS:   Please see the history of present illness.  All other systems are reviewed and otherwise negative.    PHYSICAL EXAM:   VS:  BP 110/62   Pulse (!) 58   Resp 16   Ht 5\' 8"  (1.727 m)   Wt 175 lb 1.9 oz (79.4 kg)   SpO2 98%   BMI 26.63 kg/m   BMI: Body mass index is 26.63 kg/m. GEN: Well nourished, well developed WM, in no acute distress  HEENT: normocephalic, atraumatic Neck: no JVD, carotid bruits, or masses Cardiac: RRR; no murmurs, rubs, or gallops, no edema,  Respiratory:  clear to auscultation  bilaterally, normal work of breathing GI: soft, nontender, nondistended, + BS MS: no deformity or atrophy  Skin: warm and dry. Mild fist-sized area of ecchymosis right flank which appears to be in late stages of resolution with yellowing and dissipation of bruising, sternal and endoscopic scars well healing without dehiscence, suppuration or erythema. Right radial cath site without hematoma or ecchymosis; good pulse. Right groin cannulation site (not from cath) without ecchymosis, hematoma or bruit. Neuro:  Alert and Oriented x 3, Strength and sensation are intact, follows commands Psych: euthymic mood, full affect  Wt Readings from Last 3 Encounters:  06/03/17 175 lb 1.9 oz (79.4 kg)  05/24/17 175 lb 9.6 oz (79.7 kg)      Studies/Labs Reviewed:   EKG:  EKG was ordered today and personally reviewed by me and demonstrates NSR 66bpm, short PR, incomplete RBBB, nonspecific ST-T changes  Recent Labs: 05/16/2017: TSH 9.087 05/18/2017: ALT 102 05/20/2017: Magnesium 2.5 05/23/2017: BUN 16; Creatinine, Ser 0.97; Hemoglobin 8.7; Platelets 250; Potassium 3.7; Sodium 136   Lipid Panel    Component Value Date/Time   CHOL 210 (H) 05/12/2017 1832   TRIG 124 05/12/2017 1832   HDL 44 05/12/2017 1832   CHOLHDL 4.8 05/12/2017 1832   VLDL 25 05/12/2017 1832   LDLCALC 141 (H) 05/12/2017 1832    Additional studies/ records that were reviewed today include: Summarized above    ASSESSMENT & PLAN:   1. CAD s/p CABG - appears to be doing well. Feeling better every day. Has not had to use pain medication recently. Continue ASA, statin, BB, Plavix. Mild flank bruising is nonspecific, suspect some sort of unrecognized trauma to the area recently. This appears to be in late stages of resolution and he has no other signs of systemic bleeding. Did not have a femoral cath. Will f/u CBC. Asked him to keep a close eye on this and call if any worsening. Has f/u planned with [facility] 11/28. He was advised not to  drive until cleared by surgeons. He plans to eventually participate in cardiac rehab plan. He does report some difficulty sleeping at night but also feels this might be because he isn't doing as much as he used to be doing during the day. No cardiac sx to accompany this. I've asked him to discuss sleep hygiene and maintenance with his PCP. He reports previously being on Effexor but ran out in September and never restarted. No recent mood changes, SI, HI or signs of recurrent depression. 2. HTN - controlled. Initial BP by tech was 90 systolic but recheck by me 110/62. Follow. 3. Hyperlipidemia - recheck CMET  today. If normalized, anticipate continuation of statin. I've asked him to fast at his next f/u visit so that liver/lipids can be obtained at that time. Have not placed order yet in case changes are needed to meds or additional labs are needed at that visit. 4. Abnormal LFTs - recheck today. 5. Abnormal TSH - recheck today.  Disposition: F/u with Dr. Betsy Pries team APP in 6 weeks.   Medication Adjustments/Labs and Tests Ordered: Current medicines are reviewed at length with the patient today.  Concerns regarding medicines are outlined above. Medication changes, Labs and Tests ordered today are summarized above and listed in the Patient Instructions accessible in Encounters.   Signed, [name], PA-C  06/03/2017 11:48 AM    [address], [place], [place]  [address] Phone: [phone]; Fax: [phone]

## 2017-06-03 ENCOUNTER — Ambulatory Visit (INDEPENDENT_AMBULATORY_CARE_PROVIDER_SITE_OTHER): Payer: PPO | Admitting: Physician Assistant

## 2017-06-03 ENCOUNTER — Encounter: Payer: Self-pay | Admitting: Physician Assistant

## 2017-06-03 VITALS — BP 110/62 | HR 58 | Resp 16 | Ht 68.0 in | Wt 175.1 lb

## 2017-06-03 DIAGNOSIS — R945 Abnormal results of liver function studies: Secondary | ICD-10-CM | POA: Diagnosis not present

## 2017-06-03 DIAGNOSIS — I10 Essential (primary) hypertension: Secondary | ICD-10-CM | POA: Diagnosis not present

## 2017-06-03 DIAGNOSIS — I2510 Atherosclerotic heart disease of native coronary artery without angina pectoris: Secondary | ICD-10-CM | POA: Diagnosis not present

## 2017-06-03 DIAGNOSIS — R7989 Other specified abnormal findings of blood chemistry: Secondary | ICD-10-CM

## 2017-06-03 DIAGNOSIS — E785 Hyperlipidemia, unspecified: Secondary | ICD-10-CM

## 2017-06-03 LAB — CBC
Hematocrit: 32.9 % — ABNORMAL LOW (ref 37.5–51.0)
Hemoglobin: 11 g/dL — ABNORMAL LOW (ref 13.0–17.7)
MCH: 30.1 pg (ref 26.6–33.0)
MCHC: 33.4 g/dL (ref 31.5–35.7)
MCV: 90 fL (ref 79–97)
PLATELETS: 934 10*3/uL — AB (ref 150–379)
RBC: 3.65 x10E6/uL — ABNORMAL LOW (ref 4.14–5.80)
RDW: 13 % (ref 12.3–15.4)
WBC: 9.1 10*3/uL (ref 3.4–10.8)

## 2017-06-03 LAB — COMPREHENSIVE METABOLIC PANEL
ALBUMIN: 3.9 g/dL (ref 3.6–4.8)
ALK PHOS: 92 IU/L (ref 39–117)
ALT: 17 IU/L (ref 0–44)
AST: 11 IU/L (ref 0–40)
Albumin/Globulin Ratio: 1.7 (ref 1.2–2.2)
BILIRUBIN TOTAL: 0.3 mg/dL (ref 0.0–1.2)
BUN / CREAT RATIO: 14 (ref 10–24)
BUN: 14 mg/dL (ref 8–27)
CO2: 23 mmol/L (ref 20–29)
Calcium: 9.6 mg/dL (ref 8.6–10.2)
Chloride: 100 mmol/L (ref 96–106)
Creatinine, Ser: 0.98 mg/dL (ref 0.76–1.27)
GFR calc Af Amer: 92 mL/min/{1.73_m2} (ref >59)
GFR calc non Af Amer: 79 mL/min/{1.73_m2} (ref >59)
Globulin, Total: 2.3 g/dL (ref 1.5–4.5)
Glucose: 104 mg/dL — ABNORMAL HIGH (ref 65–99)
Potassium: 5.3 mmol/L — ABNORMAL HIGH (ref 3.5–5.2)
SODIUM: 138 mmol/L (ref 134–144)
Total Protein: 6.2 g/dL (ref 6.0–8.5)

## 2017-06-03 LAB — TSH: TSH: 6.89 u[IU]/mL — ABNORMAL HIGH (ref 0.450–4.500)

## 2017-06-03 NOTE — Patient Instructions (Signed)
Medication Instructions:  Your physician recommends that you continue on your current medications as directed. Please refer to the Current Medication list given to you today.  Labwork: Today for complete metabolic panel, complete blood count and TSH.   Testing/Procedures: None ordered  Follow-Up: Your physician recommends that you schedule a follow-up appointment in: 6 weeks with Dr. Radford Pax or APP care team.    Any Other Special Instructions Will Be Listed Below (If Applicable).     If you need a refill on your cardiac medications before your next appointment, please call your pharmacy.

## 2017-06-04 ENCOUNTER — Telehealth: Payer: Self-pay | Admitting: *Deleted

## 2017-06-04 DIAGNOSIS — R7989 Other specified abnormal findings of blood chemistry: Secondary | ICD-10-CM

## 2017-06-04 DIAGNOSIS — E875 Hyperkalemia: Secondary | ICD-10-CM

## 2017-06-04 NOTE — Telephone Encounter (Signed)
Called and spoke with patient in regards to Cardiac Rehab - Patient is scheduled for orientation on 06/24/17 at 8:30am. Patient will attend exc class on Mondays and Fridays at 11:15am.

## 2017-06-04 NOTE — Telephone Encounter (Signed)
Spoke with pt and went over lab results and recommendations per Melina Copa, PA-C.  Pt unable to come for labs today.  Pt can come in the morning though.  Advised pt I will place orders and to come as soon as he can after 7:30A.  Pt verbalized understanding and was in agreement with this plan.

## 2017-06-04 NOTE — Telephone Encounter (Signed)
-----   Message from [name], [place] sent at 06/03/2017  5:21 PM EST ----- Please call patient, both potassium level and platelet count are elevated for unclear reasons, raising question of lab error - not on any meds to cause hyperkalemia, has normal kidney function. Can you have him come in for repeat BMET and CBC in AM? This should be sent off stat. I will be out of the office but his primary cardiologist is here tomorrow - Dr. Radford Pax - I will ask her to review and provide input on further workup if platelet count remains high. 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 As a precaution I preliminarily discussed flank bruising/platelet count with Dr. Acie Fredrickson in Dr. Theodosia Blender absence, but this appeared to be nearing resolution and without any pain - in setting of rising hemoglobin, doubt acute peritoneal bleeding issue. Pt was also not having any abdominal pain.  Dayna Dunn PA-C

## 2017-06-05 ENCOUNTER — Other Ambulatory Visit: Payer: PPO | Admitting: *Deleted

## 2017-06-05 ENCOUNTER — Telehealth: Payer: Self-pay | Admitting: Physician Assistant

## 2017-06-05 ENCOUNTER — Other Ambulatory Visit: Payer: Self-pay | Admitting: Physician Assistant

## 2017-06-05 ENCOUNTER — Telehealth: Payer: Self-pay | Admitting: *Deleted

## 2017-06-05 DIAGNOSIS — R7989 Other specified abnormal findings of blood chemistry: Secondary | ICD-10-CM | POA: Diagnosis not present

## 2017-06-05 DIAGNOSIS — R109 Unspecified abdominal pain: Secondary | ICD-10-CM

## 2017-06-05 DIAGNOSIS — I2510 Atherosclerotic heart disease of native coronary artery without angina pectoris: Secondary | ICD-10-CM

## 2017-06-05 DIAGNOSIS — E875 Hyperkalemia: Secondary | ICD-10-CM | POA: Diagnosis not present

## 2017-06-05 DIAGNOSIS — R233 Spontaneous ecchymoses: Secondary | ICD-10-CM

## 2017-06-05 DIAGNOSIS — Z79899 Other long term (current) drug therapy: Secondary | ICD-10-CM

## 2017-06-05 LAB — CBC
HEMATOCRIT: 33.1 % — AB (ref 37.5–51.0)
HEMOGLOBIN: 11.3 g/dL — AB (ref 13.0–17.7)
MCH: 29.7 pg (ref 26.6–33.0)
MCHC: 34.1 g/dL (ref 31.5–35.7)
MCV: 87 fL (ref 79–97)
PLATELETS: 939 10*3/uL — AB (ref 150–379)
RBC: 3.8 x10E6/uL — AB (ref 4.14–5.80)
RDW: 13.9 % (ref 12.3–15.4)
WBC: 8.9 10*3/uL (ref 3.4–10.8)

## 2017-06-05 LAB — BASIC METABOLIC PANEL
BUN/Creatinine Ratio: 13 (ref 10–24)
BUN: 13 mg/dL (ref 8–27)
CALCIUM: 10 mg/dL (ref 8.6–10.2)
CO2: 27 mmol/L (ref 20–29)
CREATININE: 1.03 mg/dL (ref 0.76–1.27)
Chloride: 101 mmol/L (ref 96–106)
GFR calc Af Amer: 86 mL/min/{1.73_m2} (ref >59)
GFR, EST NON AFRICAN AMERICAN: 75 mL/min/{1.73_m2} (ref >59)
GLUCOSE: 112 mg/dL — AB (ref 65–99)
Potassium: 5.2 mmol/L (ref 3.5–5.2)
Sodium: 138 mmol/L (ref 134–144)

## 2017-06-05 NOTE — Telephone Encounter (Signed)
Pt ct abd/pel ordered

## 2017-06-05 NOTE — Telephone Encounter (Signed)
error 

## 2017-06-05 NOTE — Telephone Encounter (Signed)
Stat labs reviewed. Hemoglobin continues to improve but thrombocytosis persists. Potassium remains upper limits of normal. Not on any potassium-elevating/retaining meds. Review of literature shows that reactive thrombocytosis has been documented in some post-bypass cases at around a peak of 2 weeks, with persistence up to 5 weeks afterwards. I discussed the case with Dr. Burt Knack in Dr. Theodosia Blender absence. We reached out to Dr. Prescott Gum as well as Dr. Lebron Conners (on call for heme/onc) who did not feel any urgent intervention is needed for this. Hematology recommended to follow levels and if remained elevated then they would need to see him. Regarding his hyperkalemia, I spoke with the patient and he has been drinking a lot more orange juice lately compared to prior. Discussed reduction of potassium in diet. Still, with recent spontaneous flank bruising, elevated plt and hyperkalemia on Tuesday, I discussed further workup with Dr. Burt Knack and we have a low threshold to proceed with noncontrasted CT of the abd/pelvis to r/o any systemic cause for these lab abnormalities (nonurgent per MD but have requested to obtain tomorrow rather than scheduling out any further). I confirmed with the patient that his bruising continues to improve, he has zero pain at that site, and overall feels like he is doing well. Warning signs/ER precautions reviewed. Jeanann Lewandowsky sent messaging to coordinators to call him for CT tomorrow; will also need orders for his repeat labwork as well. Pt was appreciative of call.   Ryelynn Guedea PA-C

## 2017-06-06 ENCOUNTER — Ambulatory Visit (INDEPENDENT_AMBULATORY_CARE_PROVIDER_SITE_OTHER)
Admission: RE | Admit: 2017-06-06 | Discharge: 2017-06-06 | Disposition: A | Discharge status: Home / Self Care | Payer: PPO | Source: Ambulatory Visit | Attending: Physician Assistant | Admitting: Physician Assistant

## 2017-06-06 DIAGNOSIS — R233 Spontaneous ecchymoses: Secondary | ICD-10-CM | POA: Diagnosis not present

## 2017-06-06 DIAGNOSIS — R109 Unspecified abdominal pain: Secondary | ICD-10-CM | POA: Diagnosis not present

## 2017-06-06 NOTE — Telephone Encounter (Signed)
Spoke to pt re: Ct and lab work. Pt will come in this morning 10:15 for his CT, Lattie Haw in [place] has been notified, and pt will also return 06/12/17 for BMET & CBC.

## 2017-06-12 ENCOUNTER — Other Ambulatory Visit: Payer: PPO | Admitting: *Deleted

## 2017-06-12 DIAGNOSIS — Z79899 Other long term (current) drug therapy: Secondary | ICD-10-CM

## 2017-06-12 LAB — CBC
Hematocrit: 35.5 % — ABNORMAL LOW (ref 37.5–51.0)
Hemoglobin: 12 g/dL — ABNORMAL LOW (ref 13.0–17.7)
MCH: 30 pg (ref 26.6–33.0)
MCHC: 33.8 g/dL (ref 31.5–35.7)
MCV: 89 fL (ref 79–97)
Platelets: 592 x10E3/uL — ABNORMAL HIGH (ref 150–379)
RBC: 4 x10E6/uL — ABNORMAL LOW (ref 4.14–5.80)
RDW: 13.2 % (ref 12.3–15.4)
WBC: 7.8 x10E3/uL (ref 3.4–10.8)

## 2017-06-12 LAB — BASIC METABOLIC PANEL WITH GFR
BUN/Creatinine Ratio: 16 (ref 10–24)
BUN: 14 mg/dL (ref 8–27)
CO2: 23 mmol/L (ref 20–29)
Calcium: 9.5 mg/dL (ref 8.6–10.2)
Chloride: 102 mmol/L (ref 96–106)
Creatinine, Ser: 0.87 mg/dL (ref 0.76–1.27)
GFR calc Af Amer: 103 mL/min/1.73
GFR calc non Af Amer: 89 mL/min/1.73
Glucose: 124 mg/dL — ABNORMAL HIGH (ref 65–99)
Potassium: 4.1 mmol/L (ref 3.5–5.2)
Sodium: 140 mmol/L (ref 134–144)

## 2017-06-13 ENCOUNTER — Telehealth: Payer: Self-pay | Admitting: Interventional Cardiology

## 2017-06-13 NOTE — Telephone Encounter (Signed)
Spoke with pt who is reporting having nausea, "nothing tastes right" and no appetite since being in the hospital and starting on medications as listed.  He has not had any medication (oxycodone) for pain in over a week.  He is taking ASA, atorvastatin, clopidogrel and metoprolol as ordered.  Advised he can try holding atorvastatin for a few days to see if his nausea subsides and that I will forward this information to Dr Radford Pax for review and further orders.  Advised the importance of continuing all other medications and not to stop them unless directed to do so by his MD.  He states understanding and will await a return call for further instructions.

## 2017-06-13 NOTE — Telephone Encounter (Signed)
New Message    Pt c/o medication issue:  1. Name of Medication:  Plavix , metoprolol tartrate (LOPRESSOR) 25 MG tablet atorvastatin (LIPITOR) 80 MG  2. How are you currently taking this medication (dosage and times per day)?  As prescribed   3. Are you having a reaction (difficulty breathing--STAT)?  Yes   4. What is your medication issue?  Patient states this medication is making him nauseated, its making him not eat or drink, please advise patient where to go from here

## 2017-06-14 NOTE — Telephone Encounter (Signed)
Needs to see PCP

## 2017-06-16 DIAGNOSIS — K2970 Gastritis, unspecified, without bleeding: Secondary | ICD-10-CM | POA: Diagnosis not present

## 2017-06-16 DIAGNOSIS — Z951 Presence of aortocoronary bypass graft: Secondary | ICD-10-CM | POA: Diagnosis not present

## 2017-06-16 DIAGNOSIS — F419 Anxiety disorder, unspecified: Secondary | ICD-10-CM | POA: Diagnosis not present

## 2017-06-16 DIAGNOSIS — I2510 Atherosclerotic heart disease of native coronary artery without angina pectoris: Secondary | ICD-10-CM | POA: Diagnosis not present

## 2017-06-16 NOTE — Telephone Encounter (Signed)
Left detailed message to follow up with PCP ([facility]) and to call back with any further concerns/questions.

## 2017-06-17 DIAGNOSIS — H5212 Myopia, left eye: Secondary | ICD-10-CM | POA: Diagnosis not present

## 2017-06-18 ENCOUNTER — Telehealth (HOSPITAL_COMMUNITY): Payer: Self-pay

## 2017-06-18 NOTE — Telephone Encounter (Signed)
Cardiac Rehab Medication Review by a Pharmacist  Does the patient  feel that his/her medications are working for him/her?  yes  Has the patient been experiencing any side effects to the medications prescribed?  yes -Nausea/upset stomach  Does the patient measure his/her own blood pressure or blood glucose at home?  yes  -BP runs ~SBP 120's and DBP 70's with a HR around 70.  Does the patient have any problems obtaining medications due to transportation or finances?   no  Understanding of regimen: good Understanding of indications: good Potential of compliance: good   Pharmacist comments: Mr. Kawahara is a pleasant 66 y/o M who I spoke with regarding his medication and allergy history prior to his upcoming cardiac rehab appointment. He endorses compliance with his medications but does have some upset stomach/nausea with them. He takes food with the medications and was started on pantoprazole to help with the indigestion. Mr. Shelnutt denies any other side effects and has no questions at this time.   Patterson Hammersmith PharmD PGY1 Pharmacy Practice Resident 06/18/2017 2:44 PM Resident Office Phone: [phone]

## 2017-06-24 ENCOUNTER — Encounter (HOSPITAL_COMMUNITY)
Admission: RE | Admit: 2017-06-24 | Discharge: 2017-06-24 | Disposition: A | Discharge status: Home / Self Care | Payer: PPO | Source: Ambulatory Visit | Attending: Cardiology | Admitting: Cardiology

## 2017-06-24 ENCOUNTER — Other Ambulatory Visit: Payer: Self-pay | Admitting: Cardiothoracic Surgery

## 2017-06-24 ENCOUNTER — Encounter (HOSPITAL_COMMUNITY): Payer: Self-pay

## 2017-06-24 VITALS — BP 100/62 | HR 68 | Ht 67.0 in | Wt 174.6 lb

## 2017-06-24 DIAGNOSIS — Z951 Presence of aortocoronary bypass graft: Secondary | ICD-10-CM

## 2017-06-24 HISTORY — DX: Hyperlipidemia, unspecified: E78.5

## 2017-06-24 NOTE — Progress Notes (Signed)
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  aspirin EC 81 MG EC tablet, Take 1 tablet (81 mg total) by mouth daily., Disp: , Rfl:  .  atorvastatin (LIPITOR) 80 MG tablet, Take 1 tablet (80 mg total) by mouth daily at 6 PM., Disp: 30 tablet, Rfl: 1 .  clopidogrel (PLAVIX) 75 MG tablet, Take 1 tablet (75 mg total) by mouth daily., Disp: 30 tablet, Rfl: 1 .  metoprolol tartrate (LOPRESSOR) 25 MG tablet, Take 1 tablet (25 mg total) by mouth 2 (two) times daily., Disp: 60 tablet, Rfl: 1 .  oxyCODONE (OXY IR/ROXICODONE) 5 MG immediate release tablet, Take 1-2 tablets (5-10 mg total) by mouth every 6 (six) hours as needed for severe pain. (Patient not taking: Reported on 06/18/2017), Disp: 30 tablet, Rfl: 0 .  pantoprazole (PROTONIX) 40 MG tablet, Take 40 mg by mouth daily., Disp: , Rfl:   Past Medical History: Past Medical History:  Diagnosis Date  . CAD in native artery    a.  CABG x 4 utilizing LIMA ot LAD, SVG to Ramus, SVG to Diagonal #2, and SVG to PDA on 05/19/17.  . Chest pain 05/12/2017  . Coronary artery disease   . Essential hypertension   . 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 Hyperglycemia    a. A1C 5.7 in 04/2017.  Marland Kitchen Hyperlipidemia   . Medical history non-contributory   . Non-ST elevation (NSTEMI) myocardial infarction ([place])     Tobacco Use: Social History   Tobacco Use  Smoking Status Never Smoker  Smokeless Tobacco Never Used    Labs: Recent  Review Flowsheet Data    Labs for ITP Cardiac and Pulmonary Rehab Latest Ref Rng & Units 05/19/2017 05/19/2017 05/19/2017 05/19/2017 05/20/2017   Cholestrol 0 - 200 mg/dL - - - - -   LDLCALC 0 - 99 mg/dL - - - - -   HDL >40 mg/dL - - - - -   Trlycerides <150 mg/dL - - - - -   Hemoglobin A1c 4.8 - 5.6 % - - - - -   PHART 7.350 - 7.450 7.444 7.344(L) 7.348(L) - -   PCO2ART 32.0 - 48.0 mmHg 38.7 44.4 41.0 - -   HCO3 20.0 - 28.0 mmol/L 26.7 24.2 22.4 - -   TCO2 22 - 32 mmol/L 28 26 24 23 26    ACIDBASEDEF 0.0 - 2.0 mmol/L - 2.0 3.0(H) - -   O2SAT % 99.0 99.0 97.0 - -      Capillary Blood Glucose: Lab Results  Component Value Date   GLUCAP 97 05/24/2017   GLUCAP 110 (H) 05/23/2017   GLUCAP 120 (H) 05/23/2017   GLUCAP 133 (H) 05/23/2017   GLUCAP 120 (H) 05/22/2017     Exercise Target Goals: Date: 06/24/17  Exercise Program Goal: Individual exercise prescription set with THRR, safety & activity barriers. Participant demonstrates ability to understand and report RPE using BORG scale, to self-measure pulse accurately, and  to acknowledge the importance of the exercise prescription.  Exercise Prescription Goal: Starting with aerobic activity 30 plus minutes a day, 3 days per week for initial exercise prescription. Provide home exercise prescription and guidelines that participant acknowledges understanding prior to discharge.  Activity Barriers & Risk Stratification: Activity Barriers & Cardiac Risk Stratification - 06/24/17 0851      Activity Barriers & Cardiac Risk Stratification   Activity Barriers  Deconditioning;Incisional Pain;Muscular Weakness    Cardiac Risk Stratification  High       6 Minute Walk: 6 Minute Walk    Row Name 06/24/17 1156         6 Minute Walk   Phase  Initial     Distance  1400 feet     Walk Time  6 minutes     # of Rest Breaks  0     MPH  2.65     METS  3.13     RPE  11     VO2 Peak  10.98     Symptoms  No     Resting HR  68 bpm     Resting  BP  100/60     Resting Oxygen Saturation   96 %     Exercise Oxygen Saturation  during 6 min walk  96 %     Max Ex. HR  102 bpm     Max Ex. BP  114/62     2 Minute Post BP  92/64 105/62 with hydration        Oxygen Initial Assessment:   Oxygen Re-Evaluation:   Oxygen Discharge (Final Oxygen Re-Evaluation):   Initial Exercise Prescription: Initial Exercise Prescription - 06/24/17 1200      Date of Initial Exercise RX and Referring Provider   Date  06/24/17    Referring Provider  Fransico Him MD      Treadmill   MPH  2.6    Grade  0    Minutes  10    METs  2.99      Bike   Level  0.7    Minutes  10    METs  2.65      NuStep   Level  3    SPM  80    Minutes  10    METs  2.5      Prescription Details   Frequency (times per week)  3    Duration  Progress to 30 minutes of continuous aerobic without signs/symptoms of physical distress      Intensity   THRR 40-80% of Max Heartrate  61-122    Ratings of Perceived Exertion  11-13    Perceived Dyspnea  0-4      Progression   Progression  Continue to progress workloads to maintain intensity without signs/symptoms of physical distress.      Resistance Training   Training Prescription  Yes    Weight  3lbs    Reps  10-15       Perform Capillary Blood Glucose checks as needed.  Exercise Prescription Changes:   Exercise Comments:   Exercise Goals and Review: Exercise Goals    Row Name 06/24/17 7619 06/24/17 0855           Exercise Goals   Increase Physical Activity  Yes  -      Intervention  Provide advice, education, support and counseling about physical activity/exercise needs.;Develop an individualized exercise prescription for aerobic and resistive training based on initial evaluation findings, risk stratification,  comorbidities and participant's personal goals.  -      Expected Outcomes  Achievement of increased cardiorespiratory fitness and enhanced flexibility, muscular endurance and strength  shown through measurements of functional capacity and personal statement of participant.  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  -      Expected Outcomes  Achievement of increased cardiorespiratory fitness and enhanced flexibility, muscular endurance and strength shown through measurements of functional capacity and personal statement of participant.  -      Able to understand and use rate of perceived exertion (RPE) scale  Yes  -      Intervention  Provide education and explanation on how to use RPE scale  -      Expected Outcomes  Short Term: Able to use RPE daily in rehab to express subjective intensity level;Long Term:  Able to use RPE to guide intensity level when exercising independently  -      Knowledge and understanding of Target Heart Rate Range (THRR)  Yes  -      Intervention  Provide education and explanation of THRR including how the numbers were predicted and where they are located for reference  -      Expected Outcomes  Short Term: Able to state/look up THRR;Long Term: Able to use THRR to govern intensity when exercising independently;Short Term: Able to use daily as guideline for intensity in rehab  -      Able to check pulse independently  Yes  -      Intervention  Provide education and demonstration on how to check pulse in carotid and radial arteries.;Review the importance of being able to check your own pulse for safety during independent exercise  -      Expected Outcomes  Short Term: Able to explain why pulse checking is important during independent exercise;Long Term: Able to check pulse independently and accurately  -      Understanding of  Exercise Prescription  Yes  -      Intervention  Provide education, explanation, and written materials on patient's individual exercise prescription  -      Expected Outcomes  Short Term: Able to explain program exercise prescription;Long Term: Able to explain home exercise prescription to exercise independently  -         Exercise Goals Re-Evaluation :    Discharge Exercise Prescription (Final Exercise Prescription Changes):   Nutrition:  Target Goals: Understanding of nutrition guidelines, daily intake of sodium 1500mg , cholesterol 200mg , calories 30% from fat and 7% or less from saturated fats, daily to have 5 or more servings of fruits and vegetables.  Biometrics: Pre Biometrics - 06/24/17 1158      Pre Biometrics   Height  5\' 7"  (1.702 m)    Weight  174 lb 9.7 oz (79.2 kg)    Waist Circumference  35 inches    Hip Circumference  39.5 inches    Waist to Hip Ratio  0.89 %    BMI (Calculated)  27.34    Triceps Skinfold  15 mm    % Body Fat  25.1 %    Grip Strength  33 kg    Flexibility  14  in    Single Leg Stand  30 seconds        Nutrition Therapy Plan and Nutrition Goals:   Nutrition Discharge: Nutrition Scores:   Nutrition Goals Re-Evaluation:   Nutrition Goals Re-Evaluation:   Nutrition Goals Discharge (Final Nutrition Goals Re-Evaluation):   Psychosocial: Target Goals: Acknowledge presence or absence of significant depression and/or stress, maximize coping skills, provide positive support system. Participant is able to verbalize types and ability to use techniques and skills needed for reducing stress and depression.  Initial Review & Psychosocial Screening: Initial Psych Review & Screening - 06/24/17 1002      Initial Review   Current issues with  Current Anxiety/Panic;Current Depression health related anxiety and depression      Family Dynamics   Good Support System?  Yes spouse    Comments  pt has decreased appetite and GI upset post op,  pt  concerns include being out of work.        Barriers   Psychosocial barriers to participate in program  There are no identifiable barriers or psychosocial needs.      Screening Interventions   Interventions  Encouraged to exercise;Provide feedback about the scores to participant       Quality of Life Scores: Quality of Life - 06/24/17 0852      Quality of Life Scores   Health/Function Pre  18.8 %    Socioeconomic Pre  24.38 %    Psych/Spiritual Pre  15.36 %    Family Pre  16.8 %    GLOBAL Pre  19.1 %       PHQ-9: Recent Review Flowsheet Data    There is no flowsheet data to display.     Interpretation of Total Score  Total Score Depression Severity:  1-4 = Minimal depression, 5-9 = Mild depression, 10-14 = Moderate depression, 15-19 = Moderately severe depression, 20-27 = Severe depression   Psychosocial Evaluation and Intervention:   Psychosocial Re-Evaluation:   Psychosocial Discharge (Final Psychosocial Re-Evaluation):   Vocational Rehabilitation: Provide vocational rehab assistance to qualifying candidates.   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 Participant will state understanding/return demonstration of topics presented.  Learning Barriers/Preferences: Learning Barriers/Preferences - 06/24/17 0851      Learning Barriers/Preferences   Learning Barriers  Sight    Learning Preferences  Video;Pictoral;Computer/Internet       Education Topics: Count Your Pulse:  -Group instruction provided by verbal instruction, demonstration, patient participation and written materials to support subject.  Instructors address importance of being able to find your pulse  and how to count your pulse when at home without a heart monitor.  Patients get hands on experience counting their pulse with staff help and individually.   Heart Attack, Angina, and Risk Factor Modification:  -Group instruction provided by verbal instruction, video, and written materials to support subject.  Instructors address signs and symptoms of angina and heart attacks.    Also discuss risk factors for heart disease and how to make changes to improve heart health risk factors.   Functional Fitness:  -Group instruction provided by verbal instruction, demonstration, patient participation, and written materials to support subject.  Instructors address safety measures for doing  things around the house.  Discuss how to get up and down off the floor, how to pick things up properly, how to safely get out of a chair without assistance, and balance training.   Meditation and Mindfulness:  -Group instruction provided by verbal instruction, patient participation, and written materials to support subject.  Instructor addresses importance of mindfulness and meditation practice to help reduce stress and improve awareness.  Instructor also leads participants through a meditation exercise.    Stretching for Flexibility and Mobility:  -Group instruction provided by verbal instruction, patient participation, and written materials to support subject.  Instructors lead participants through series of stretches that are designed to increase flexibility thus improving mobility.  These stretches are additional exercise for major muscle groups that are typically performed during regular warm up and cool down.   Hands Only CPR:  -Group verbal, video, and participation provides a basic overview of AHA guidelines for community CPR. Role-play of emergencies allow participants the opportunity to practice calling for help and chest compression technique with discussion of AED use.   Hypertension: -Group verbal and  written instruction that provides a basic overview of hypertension including the most recent diagnostic guidelines, risk factor reduction with self-care instructions and medication management.    Nutrition I class: Heart Healthy Eating:  -Group instruction provided by PowerPoint slides, verbal discussion, and written materials to support subject matter. The instructor gives an explanation and review of the Therapeutic Lifestyle Changes diet recommendations, which includes a discussion on lipid goals, dietary fat, sodium, fiber, plant stanol/sterol esters, sugar, and the components of a well-balanced, healthy diet.   Nutrition II class: Lifestyle Skills:  -Group instruction provided by PowerPoint slides, verbal discussion, and written materials to support subject matter. The instructor gives an explanation and review of label reading, grocery shopping for heart health, heart healthy recipe modifications, and ways to make healthier choices when eating out.   Diabetes Question & Answer:  -Group instruction provided by PowerPoint slides, verbal discussion, and written materials to support subject matter. The instructor gives an explanation and review of diabetes co-morbidities, pre- and post-prandial blood glucose goals, pre-exercise blood glucose goals, signs, symptoms, and treatment of hypoglycemia and hyperglycemia, and foot care basics.   Diabetes Blitz:  -Group instruction provided by PowerPoint slides, verbal discussion, and written materials to support subject matter. The instructor gives an explanation and review of the physiology behind type 1 and type 2 diabetes, diabetes medications and rational behind using different medications, pre- and post-prandial blood glucose recommendations and Hemoglobin A1c goals, diabetes diet, and exercise including blood glucose guidelines for exercising safely.    Portion Distortion:  -Group instruction provided by PowerPoint slides, verbal discussion,  written materials, and food models to support subject matter. The instructor gives an explanation of serving size versus portion size, changes in portions sizes over the last 20 years, and what consists of a serving from each food group.   Stress Management:  -Group instruction provided by verbal instruction, video, and written materials to support subject matter.  Instructors review role of stress in heart disease and how to cope with stress positively.     Exercising on Your Own:  -Group instruction provided by verbal instruction, power point, and written materials to support subject.  Instructors discuss benefits of exercise, components of exercise, frequency and intensity of exercise, and end points for exercise.  Also discuss use of nitroglycerin and activating EMS.  Review options of places to exercise outside of rehab.  Review guidelines for  sex with heart disease.   Cardiac Drugs I:  -Group instruction provided by verbal instruction and written materials to support subject.  Instructor reviews cardiac drug classes: antiplatelets, anticoagulants, beta blockers, and statins.  Instructor discusses reasons, side effects, and lifestyle considerations for each drug class.   Cardiac Drugs II:  -Group instruction provided by verbal instruction and written materials to support subject.  Instructor reviews cardiac drug classes: angiotensin converting enzyme inhibitors (ACE-I), angiotensin II receptor blockers (ARBs), nitrates, and calcium channel blockers.  Instructor discusses reasons, side effects, and lifestyle considerations for each drug class.   Anatomy and Physiology of the Circulatory System:  Group verbal and written instruction and models provide basic cardiac anatomy and physiology, with the coronary electrical and arterial systems. Review of: AMI, Angina, Valve disease, Heart Failure, Peripheral Artery Disease, Cardiac Arrhythmia, Pacemakers, and the ICD.   Other Education:  -Group  or individual verbal, written, or video instructions that support the educational goals of the cardiac rehab program.   Knowledge Questionnaire Score: Knowledge Questionnaire Score - 06/24/17 0851      Knowledge Questionnaire Score   Pre Score  21/24       Core Components/Risk Factors/Patient Goals at Admission: Personal Goals and Risk Factors at Admission - 06/24/17 1240      Core Components/Risk Factors/Patient Goals on Admission    Weight Management  Yes;Weight Maintenance    Intervention  Weight Management: Develop a combined nutrition and exercise program designed to reach desired caloric intake, while maintaining appropriate intake of nutrient and fiber, sodium and fats, and appropriate energy expenditure required for the weight goal.;Weight Management: Provide education and appropriate resources to help participant work on and attain dietary goals.;Weight Management/Obesity: Establish reasonable short term and long term weight goals.    Admit Weight  174 lb 9.7 oz (79.2 kg)    Expected Outcomes  Weight Maintenance: Understanding of the daily nutrition guidelines, which includes 25-35% calories from fat, 7% or less cal from saturated fats, less than 200mg  cholesterol, less than 1.5gm of sodium, & 5 or more servings of fruits and vegetables daily    Hypertension  Yes    Intervention  Monitor prescription use compliance.;Provide education on lifestyle modifcations including regular physical activity/exercise, weight management, moderate sodium restriction and increased consumption of fresh fruit, vegetables, and low fat dairy, alcohol moderation, and smoking cessation.    Expected Outcomes  Short Term: Continued assessment and intervention until BP is < 140/49mm HG in hypertensive participants. < 130/4mm HG in hypertensive participants with diabetes, heart failure or chronic kidney disease.;Long Term: Maintenance of blood pressure at goal levels.    Lipids  Yes    Intervention  Provide  education and support for participant on nutrition & aerobic/resistive exercise along with prescribed medications to achieve LDL 70mg , HDL >40mg .    Expected Outcomes  Short Term: Participant states understanding of desired cholesterol values and is compliant with medications prescribed. Participant is following exercise prescription and nutrition guidelines.       Core Components/Risk Factors/Patient Goals Review:    Core Components/Risk Factors/Patient Goals at Discharge (Final Review):    ITP Comments: ITP Comments    Row Name 06/24/17 0850           ITP Comments  Dr. Fransico Him, Medical Director          Comments:.Jim attended orientation from [phone] to [phone] to review rules and guidelines for program. Completed 6 minute walk test, Intitial ITP, and exercise prescription.  . Telemetry-Normal Sinus Rhythm  with an rare PVC.  Asymptomatic.[name] reports that he has had a decreased appetite since his open heart surgery and does not like to drink water. Jim's entry blood pressure was noted at 100/60 with a resting heart rate of 68.Jim's blood pressure was noted at 114/62 during his walk test with a max heart rate of 102. Post exercise blood was noted at 92/64 with a heart rate of 68.  [name] denies feeling lightheaded but admitted that he did not eat a lot of breakfast or a lot of water today. Vin Bhagat PAC called and notified.  No new order received. [name] has an appointment with Dr Lawson Fiscal tomorrow.Will fax exercise flow sheets to Dr. Lawson Fiscal office for review with today's vital signs.[name], RN,BSN 06/24/2017 12:56 PM

## 2017-06-25 ENCOUNTER — Ambulatory Visit (INDEPENDENT_AMBULATORY_CARE_PROVIDER_SITE_OTHER): Payer: Self-pay | Admitting: Cardiothoracic Surgery

## 2017-06-25 ENCOUNTER — Encounter: Payer: Self-pay | Admitting: Cardiothoracic Surgery

## 2017-06-25 ENCOUNTER — Ambulatory Visit
Admission: RE | Admit: 2017-06-25 | Discharge: 2017-06-25 | Disposition: A | Discharge status: Home / Self Care | Payer: PPO | Source: Ambulatory Visit | Attending: Cardiothoracic Surgery | Admitting: Cardiothoracic Surgery

## 2017-06-25 ENCOUNTER — Other Ambulatory Visit: Payer: Self-pay

## 2017-06-25 VITALS — BP 93/61 | HR 98 | Ht 68.0 in | Wt 172.0 lb

## 2017-06-25 DIAGNOSIS — Z951 Presence of aortocoronary bypass graft: Secondary | ICD-10-CM

## 2017-06-25 DIAGNOSIS — J90 Pleural effusion, not elsewhere classified: Secondary | ICD-10-CM | POA: Diagnosis not present

## 2017-06-25 NOTE — Progress Notes (Signed)
PCP is Deland Pretty, MD Referring Provider is Belva Crome, MD  Chief Complaint  Patient presents with  . Routine Post Op    CABG x 4, 05/19/2017    HPI: 6 week follow-up after urgent CABG 4 after non-STEMI Patient doing well, progressing and activity, ready to start outpatient cardiac rehabilitation after orientation yesterday No angina, overall strength and appetite slowly improving Ecchymoses in the right flank now almost resolved-patient will stop Plavix which was intended for short-term use after presenting with acute coronary syndrome. He'll continue aspirin and Lipitor. He is having some myalgias in his thighs from Lipitor but we'll discuss that with his cardiology provider  Chest x-ray shows mild elevation of left hemidiaphragm but no pleural effusion  Past Medical History:  Diagnosis Date  . CAD in native artery    a.  CABG x 4 utilizing LIMA ot LAD, SVG to Ramus, SVG to Diagonal #2, and SVG to PDA on 05/19/17.  . Chest pain 05/12/2017  . Coronary artery disease   . Essential hypertension   . 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 Hyperglycemia    a. A1C 5.7 in 04/2017.  Marland Kitchen Hyperlipidemia   . Medical history non-contributory   . Non-ST elevation (NSTEMI) myocardial infarction [facility])     Past Surgical History:  Procedure Laterality Date  . CARDIAC CATHETERIZATION  05/13/2017   Dr Linard Millers 111  . CORONARY ARTERY BYPASS GRAFT N/A 05/19/2017   Procedure: CORONARY ARTERY BYPASS GRAFTING (CABG) x 4;  Surgeon: Ivin Poot, MD;  Location: [place];  Service: Open Heart Surgery;  Laterality: N/A;  . HERNIA REPAIR    . KNEE SURGERY Left 2005  . LEFT HEART CATH AND CORONARY ANGIOGRAPHY N/A 05/13/2017   Procedure: LEFT HEART CATH AND CORONARY ANGIOGRAPHY;  Surgeon: Belva Crome, MD;  Location: [place] CV LAB;  Service: Cardiovascular;  Laterality: N/A;  . TEE WITHOUT CARDIOVERSION N/A 05/19/2017   Procedure: TRANSESOPHAGEAL ECHOCARDIOGRAM (TEE);  Surgeon: Prescott Gum, Collier Salina, MD;   Location: [place];  Service: Open Heart Surgery;  Laterality: N/A;  . TONSILLECTOMY    . ULTRASOUND GUIDANCE FOR VASCULAR ACCESS  05/13/2017   Procedure: Ultrasound Guidance For Vascular Access;  Surgeon: Belva Crome, MD;  Location: [place] CV LAB;  Service: Cardiovascular;;    Family History  Problem Relation Age of Onset  . Angina Father   . Coronary artery disease Brother        s/p CABG    Social History Social History   Tobacco Use  . Smoking status: Never Smoker  . Smokeless tobacco: Never Used  Substance Use Topics  . Alcohol use: Yes    Comment: occ  . Drug use: No    Current Outpatient Medications  Medication Sig Dispense Refill  . aspirin EC 81 MG EC tablet Take 1 tablet (81 mg total) by mouth daily.    Marland Kitchen atorvastatin (LIPITOR) 80 MG tablet Take 1 tablet (80 mg total) by mouth daily at 6 PM. 30 tablet 1  . clopidogrel (PLAVIX) 75 MG tablet Take 1 tablet (75 mg total) by mouth daily. 30 tablet 1  . metoprolol tartrate (LOPRESSOR) 25 MG tablet Take 1 tablet (25 mg total) by mouth 2 (two) times daily. 60 tablet 1  . pantoprazole (PROTONIX) 40 MG tablet Take 40 mg by mouth daily.    Marland Kitchen venlafaxine XR (EFFEXOR-XR) 75 MG 24 hr capsule      No current facility-administered medications for this visit.  No Known Allergies  Review of Systems  Patient has lost proximal 10 pounds due to loss of appetite No fever  BP 93/61 (BP Location: Right Arm, Patient Position: Sitting, Cuff Size: Large)   Pulse 98   Ht 5\' 8"  (1.727 m)   Wt 172 lb (78 kg)   SpO2 (!) 67%   BMI 26.15 kg/m  Physical Exam      Exam    General- alert and comfortable   Lungs- clear without rales, wheezes   Cor- regular rate and rhythm, no murmur , gallop   Abdomen- soft, non-tender   Extremities - warm, non-tender, minimal edema   Neuro- oriented, appropriate, no focal weakness  Diagnostic Tests: Chest x-ray with left hemidiaphragm elevation, stable, no pleural effusion, sternal wires  intact  Impression: Doing well 6 months after CABG Continue current medications with exception of Plavix No lifting more than 20 pounds until 3 months after surgery Patient is ready to start cardiac rehabilitation at Hospital 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

## 2017-06-30 ENCOUNTER — Encounter (HOSPITAL_COMMUNITY)
Admission: RE | Admit: 2017-06-30 | Discharge: 2017-06-30 | Disposition: A | Discharge status: Home / Self Care | Payer: PPO | Source: Ambulatory Visit | Attending: Cardiology | Admitting: Cardiology

## 2017-06-30 DIAGNOSIS — I214 Non-ST elevation (NSTEMI) myocardial infarction: Secondary | ICD-10-CM | POA: Diagnosis not present

## 2017-06-30 DIAGNOSIS — Z951 Presence of aortocoronary bypass graft: Secondary | ICD-10-CM | POA: Insufficient documentation

## 2017-06-30 NOTE — Progress Notes (Signed)
Daily Session Note  Patient Details  Name: Jason Lewis MRN: [id number] Date of Birth: May 21, 1951 Referring Provider:     CARDIAC REHAB [place] from 06/24/2017 in [place]  Referring Provider  Fransico Him MD      Encounter Date: 06/30/2017  Check In: Session Check In - 06/30/17 1147      Check-In   Location  [facility]    Staff Present  Luetta Nutting Fair, MS, ACSM RCEP, Exercise Physiologist;Maria Whitaker, RN, Clinical cytogeneticist, RN, Deland Pretty, MS, ACSM CEP, Exercise Physiologist;Molly diVincenzo, MS, ACSM RCEP, Exercise Physiologist    Supervising physician immediately available to respond to emergencies  [facility] immediately available    Physician(s)  Dr. Nevada Crane    Medication changes reported      No    Fall or balance concerns reported     No    Tobacco Cessation  No Change    Warm-up and Cool-down  Performed as group-led instruction    Resistance Training Performed  Yes    VAD Patient?  No      Pain Assessment   Currently in Pain?  No/denies    Multiple Pain Sites  No       Capillary Blood Glucose: No results found for this or any previous visit (from the past 24 hour(s)).    Social History   Tobacco Use  Smoking Status Never Smoker  Smokeless Tobacco Never Used    Goals Met:  No report of cardiac concerns or symptoms  Goals Unmet:  Not Applicable  Comments: Jason Lewis started cardiac rehab today.  Pt tolerated light exercise without difficulty. VSS, telemetry-Sinus rhtyhm, asymptomatic.  Medication list reconciled. Pt denies barriers to medicaiton compliance.  PSYCHOSOCIAL ASSESSMENT:  PHQ-0. Pt exhibits positive coping skills, hopeful outlook with supportive family. No psychosocial needs identified at this time, no psychosocial interventions necessary.    Pt enjoys watching TV.   Pt oriented to exercise equipment and routine.    Understanding verbalized.Post exercise BP 94/60. Patient  asymptomatic. Recheck blood pressure 109/74.Will continue to monitor BP.  Jason Pall, RN,BSN 06/30/2017 12:42 PM   Dr. Fransico Him is Medical Director for Cardiac Rehab at [facility].

## 2017-06-30 NOTE — Progress Notes (Signed)
Jason Lewis 66 y.o. male DOB 1950/12/14 MRN [id number]       Nutrition  1. S/P CABG x 4 05/19/17    Past Medical History:  Diagnosis Date  . CAD in native artery    a.  CABG x 4 utilizing LIMA ot LAD, SVG to Ramus, SVG to Diagonal #2, and SVG to PDA on 05/19/17.  . Chest pain 05/12/2017  . Coronary artery disease   . Essential hypertension   . 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 Hyperglycemia    a. A1C 5.7 in 04/2017.  Marland Kitchen Hyperlipidemia   . Medical history non-contributory   . Non-ST elevation (NSTEMI) myocardial infarction [facility])    Meds reviewed.  HT: Ht Readings from Last 1 Encounters:  06/25/17 5\' 8"  (1.727 m)    WT: Wt Readings from Last 3 Encounters:  06/25/17 172 lb (78 kg)  06/24/17 174 lb 9.7 oz (79.2 kg)  06/03/17 175 lb 1.9 oz (79.4 kg)     BMI 27.34   Current tobacco use? No   Labs:  Lipid Panel     Component Value Date/Time   CHOL 210 (H) 05/12/2017 1832   TRIG 124 05/12/2017 1832   HDL 44 05/12/2017 1832   CHOLHDL 4.8 05/12/2017 1832   VLDL 25 05/12/2017 1832   LDLCALC 141 (H) 05/12/2017 1832    Lab Results  Component Value Date   HGBA1C 5.7 (H) 05/18/2017   CBG (last 3)  No results for input(s): GLUCAP in the last 72 hours.  Nutrition Diagnosis ? Food-and nutrition-related knowledge deficit related to lack of exposure to information as related to diagnosis of: ? CVD ? Pre-DM ? Overweight related to excessive energy intake as evidenced by a BMI of 27.34  Nutrition Goal(s):  ? Pt to identify and limit food sources of saturated fat, trans fat, and sodium ? Pt to identify food quantities necessary to achieve weight loss of 6-10 lb at graduation from cardiac rehab.   Plan:  Pt to attend nutrition classes ? Nutrition I ? Nutrition II ? Portion Distortion  Will provide client-centered nutrition education as part of interdisciplinary care.   Monitor and evaluate progress toward nutrition goal with team.  Derek Mound, M.Ed, RD, LDN, CDE 06/30/2017 10:57  AM

## 2017-07-01 ENCOUNTER — Telehealth: Payer: Self-pay | Admitting: *Deleted

## 2017-07-01 NOTE — Telephone Encounter (Signed)
Continue to monitor - no orders/changes received by DOD. Informed rehab and pt to continue to monitor.   Advised pt if he began to experience symptoms with low BPs to inform our office so that we may re-address treatment plan.  Patient verbalized understanding and agreeable to plan.

## 2017-07-01 NOTE — Telephone Encounter (Signed)
Received call from cardiac rehab reporting pt BPs running low.   Rehab reports pt asymptomatic with hypotension. Recent NSTEMI/CABG x 4 end of October. BPs reported 104/60, 94/60, 109/74.  HRs 70s. Dickey Gave, cardiac rehab, reports calling on 11/27 and spoke w/ Vin reported low BP (92/64) and states he did not want to change anything at that time. Rehab asking about decreasing BB. Will review with DOD, Dr. Irish Lack, as Dr. Radford Pax is out of the office until 12/10.

## 2017-07-02 ENCOUNTER — Encounter (HOSPITAL_COMMUNITY)
Admission: RE | Admit: 2017-07-02 | Discharge: 2017-07-02 | Disposition: A | Discharge status: Home / Self Care | Payer: PPO | Source: Ambulatory Visit | Attending: Cardiology | Admitting: Cardiology

## 2017-07-02 ENCOUNTER — Telehealth: Payer: Self-pay | Admitting: Interventional Cardiology

## 2017-07-02 DIAGNOSIS — Z951 Presence of aortocoronary bypass graft: Secondary | ICD-10-CM

## 2017-07-02 DIAGNOSIS — I214 Non-ST elevation (NSTEMI) myocardial infarction: Secondary | ICD-10-CM | POA: Diagnosis not present

## 2017-07-02 NOTE — Telephone Encounter (Signed)
° °  [facility] Pre-operative Risk Assessment    Request for surgical clearance:  1. What type of surgery is being performed? Tooth extraction  2. When is this surgery scheduled? 07/08/17 at 8:45 a.m.  3. Are there any medications that need to be held prior to surgery and how long?if there is any medications that need to be held, please inform us.  4. Practice name and name of physician performing surgery? Irene Shipper, D.D.S. Agustina Caroli. France Ravens, D.M.D., physician performing surgery: Irene Shipper  5. What is your office phone and fax number? [place]: [phone], Fax# [phone]  6. Anesthesia type (None, local, MAC, general) ? Not specified   Derl Barrow 07/02/2017, 8:16 AM  _________________________________________________________________   (provider comments below)

## 2017-07-04 ENCOUNTER — Encounter (HOSPITAL_COMMUNITY)
Admission: RE | Admit: 2017-07-04 | Discharge: 2017-07-04 | Disposition: A | Discharge status: Home / Self Care | Payer: PPO | Source: Ambulatory Visit | Attending: Cardiology | Admitting: Cardiology

## 2017-07-04 DIAGNOSIS — I214 Non-ST elevation (NSTEMI) myocardial infarction: Secondary | ICD-10-CM | POA: Diagnosis not present

## 2017-07-04 DIAGNOSIS — Z951 Presence of aortocoronary bypass graft: Secondary | ICD-10-CM

## 2017-07-04 NOTE — Telephone Encounter (Signed)
   Chart reviewed as part of pre-operative protocol coverage.  Anesthesia type or number of extractions not specified - callback staff, can you please clarify? Thanks.  [name], PA-C 07/04/2017, 8:27 AM

## 2017-07-07 ENCOUNTER — Encounter (HOSPITAL_COMMUNITY): Payer: PPO

## 2017-07-07 NOTE — Telephone Encounter (Signed)
   See below. I personally tried to call dental office to clarify whether this is simple or surgical extraction and only got automated message. Callback staff will need to try again when office reopens. Assuming simple extraction, this typically does not require any pre-op clearance, and can be completed while patient is on aspirin and Plavix (recent NSTEMI/CABG so would be ideal not to stop).  [name], PA-C 07/07/2017, 9:18 AM

## 2017-07-09 ENCOUNTER — Encounter: Payer: Self-pay | Admitting: Physician Assistant

## 2017-07-09 ENCOUNTER — Encounter (HOSPITAL_COMMUNITY)
Admission: RE | Admit: 2017-07-09 | Discharge: 2017-07-09 | Disposition: A | Discharge status: Home / Self Care | Payer: PPO | Source: Ambulatory Visit | Attending: Cardiology | Admitting: Cardiology

## 2017-07-09 DIAGNOSIS — Z951 Presence of aortocoronary bypass graft: Secondary | ICD-10-CM

## 2017-07-09 DIAGNOSIS — I214 Non-ST elevation (NSTEMI) myocardial infarction: Secondary | ICD-10-CM | POA: Diagnosis not present

## 2017-07-11 ENCOUNTER — Encounter (HOSPITAL_COMMUNITY)
Admission: RE | Admit: 2017-07-11 | Discharge: 2017-07-11 | Disposition: A | Discharge status: Home / Self Care | Payer: PPO | Source: Ambulatory Visit | Attending: Cardiology | Admitting: Cardiology

## 2017-07-11 DIAGNOSIS — Z951 Presence of aortocoronary bypass graft: Secondary | ICD-10-CM

## 2017-07-11 DIAGNOSIS — I214 Non-ST elevation (NSTEMI) myocardial infarction: Secondary | ICD-10-CM | POA: Diagnosis not present

## 2017-07-11 NOTE — Progress Notes (Signed)
STEVENSON WINDMILLER 66 y.o. male DOB [birth year] MRN [id number]       Nutrition  Dx: s/p CABG x 4, LIMA Note Spoke with pt. Nutrition Plan and Nutrition Survey goals reviewed with pt. Pt is following a Heart Healthy diet. There are some areas for improvement in pt diet, which were discussed. Pt expressed understanding of the information reviewed. Pt aware of nutrition education classes offered.  Nutrition Diagnosis ? Food-and nutrition-related knowledge deficit related to lack of exposure to information as related to diagnosis of: ? CVD ? Pre-DM ? Overweight related to excessive energy intake as evidenced by a BMI of 27.34  Nutrition Intervention ? Pt's individual nutrition plan reviewed with pt. ? Benefits of adopting Heart Healthy diet discussed when Medficts reviewed.   ? Pt given handouts for: ? Nutrition I class ? Nutrition II class ? nutrition class schedule  Nutrition Goal(s):  ? Pt to identify and limit food sources of saturated fat, trans fat, and sodium ? Pt to identify food quantities necessary to achieve weight loss of 6-10 lb at graduation from cardiac rehab.   Plan:  Pt to attend nutrition classes ? Nutrition I ? Nutrition II ? Portion Distortion  Will provide client-centered nutrition education as part of interdisciplinary care.   Monitor and evaluate progress toward nutrition goal with team.  Derek Mound, M.Ed, RD, LDN, CDE 07/11/2017 12:08 PM

## 2017-07-11 NOTE — Telephone Encounter (Signed)
Tried to reach Dr. Ronnald Ramp, [facility], to inquire about pts procedure, office is closed today. Will send back to preop call back pool for someone to try again Monday.

## 2017-07-14 ENCOUNTER — Encounter (HOSPITAL_COMMUNITY)
Admission: RE | Admit: 2017-07-14 | Discharge: 2017-07-14 | Disposition: A | Discharge status: Home / Self Care | Payer: PPO | Source: Ambulatory Visit | Attending: Cardiology | Admitting: Cardiology

## 2017-07-14 ENCOUNTER — Ambulatory Visit (INDEPENDENT_AMBULATORY_CARE_PROVIDER_SITE_OTHER): Payer: PPO | Admitting: Physician Assistant

## 2017-07-14 ENCOUNTER — Encounter: Payer: Self-pay | Admitting: Physician Assistant

## 2017-07-14 VITALS — BP 116/70 | HR 65 | Ht 68.0 in | Wt 173.1 lb

## 2017-07-14 DIAGNOSIS — I2510 Atherosclerotic heart disease of native coronary artery without angina pectoris: Secondary | ICD-10-CM

## 2017-07-14 DIAGNOSIS — Z951 Presence of aortocoronary bypass graft: Secondary | ICD-10-CM

## 2017-07-14 DIAGNOSIS — I10 Essential (primary) hypertension: Secondary | ICD-10-CM

## 2017-07-14 DIAGNOSIS — I214 Non-ST elevation (NSTEMI) myocardial infarction: Secondary | ICD-10-CM | POA: Diagnosis not present

## 2017-07-14 DIAGNOSIS — E785 Hyperlipidemia, unspecified: Secondary | ICD-10-CM | POA: Diagnosis not present

## 2017-07-14 DIAGNOSIS — D473 Essential (hemorrhagic) thrombocythemia: Secondary | ICD-10-CM | POA: Diagnosis not present

## 2017-07-14 DIAGNOSIS — D75839 Thrombocytosis, unspecified: Secondary | ICD-10-CM | POA: Insufficient documentation

## 2017-07-14 LAB — CBC
HEMATOCRIT: 38.8 % (ref 37.5–51.0)
HEMOGLOBIN: 13.3 g/dL (ref 13.0–17.7)
MCH: 29.2 pg (ref 26.6–33.0)
MCHC: 34.3 g/dL (ref 31.5–35.7)
MCV: 85 fL (ref 79–97)
Platelets: 344 10*3/uL (ref 150–379)
RBC: 4.55 x10E6/uL (ref 4.14–5.80)
RDW: 13.7 % (ref 12.3–15.4)
WBC: 7.7 10*3/uL (ref 3.4–10.8)

## 2017-07-14 LAB — HEPATIC FUNCTION PANEL
ALT: 16 IU/L (ref 0–44)
AST: 13 IU/L (ref 0–40)
Albumin: 4.3 g/dL (ref 3.6–4.8)
Alkaline Phosphatase: 95 IU/L (ref 39–117)
BILIRUBIN TOTAL: 0.4 mg/dL (ref 0.0–1.2)
BILIRUBIN, DIRECT: 0.15 mg/dL (ref 0.00–0.40)
Total Protein: 6.5 g/dL (ref 6.0–8.5)

## 2017-07-14 LAB — LIPID PANEL
Chol/HDL Ratio: 3 ratio (ref 0.0–5.0)
Cholesterol, Total: 105 mg/dL (ref 100–199)
HDL: 35 mg/dL — ABNORMAL LOW (ref >39)
LDL Calculated: 48 mg/dL (ref 0–99)
Triglycerides: 110 mg/dL (ref 0–149)
VLDL Cholesterol Cal: 22 mg/dL (ref 5–40)

## 2017-07-14 MED ORDER — ATORVASTATIN CALCIUM 40 MG PO TABS: 40.0000 mg | ORAL_TABLET | Freq: Every day | ORAL | 3 refills | Status: DC

## 2017-07-14 NOTE — Telephone Encounter (Signed)
   Primary Cardiologist: Fransico Him, MD  Chart reviewed as part of pre-operative protocol coverage. As tooth extraction is low risk, based on ACC/AHA guidelines, Jason Lewis would be at acceptable risk for the planned procedure without further cardiovascular testing. This typically can be completed while patient is on aspirin and Plavix (recent NSTEMI/CABG so would be ideal not to stop). I reviewed with Dr. Irish Lack and patient is not felt to require antibiotics.  I will route this recommendation to the requesting party via Epic fax function and remove from pre-op pool. Preop callback staff, can you notify patient and dentist?  Please call with questions.  [name], PA-C 07/14/2017, 9:15 AM

## 2017-07-14 NOTE — Telephone Encounter (Signed)
Spoke wit Peggy @ [facility], pt is having 1 extraction. They have rescheduled his procedure until today @ 3:00. They are using local anesthesia. They are needing to know re: abx anaphylaxis, if he needed to hold medications.  Please advise!

## 2017-07-14 NOTE — Patient Instructions (Addendum)
Medication Instructions:  DECREASE LIPITOR 40 MG DAILY  Labwork: TODAY CBC, LIPID AND LIVER PANEL  Testing/Procedures: NONE ORDERED TODAY  Follow-Up: 09/19/17 @ 3:20 WITH DR. Radford Pax   Any Other Special Instructions Will Be Listed Below (If Applicable).     If you need a refill on your cardiac medications before your next appointment, please call your pharmacy.

## 2017-07-14 NOTE — Telephone Encounter (Signed)
Called [facility] back and let them know that pt is an unacceptable risk for the tooth extraction and he doesn't need to stop any medications.  Pt actually has an appt here today @ 10:00.  Will fwd this to Ermalinda Barrios, PA-C to let her know that she can communicate this to the pt.

## 2017-07-14 NOTE — Progress Notes (Signed)
Cardiology Office Note    Date:  07/14/2017   ID:  Jason Lewis, DOB [birth year], MRN [id number]  PCP:  Deland Pretty, MD  Cardiologist: Dr. Radford Pax  Chief Complaint  Patient presents with  . Follow-up    History of Present Illness:  Jason Lewis is a 66 y.o. male with history of hypertension, HLD, CAD status post NSTEMI followed by CABG x4 with LIMA to the LAD, SVG to the ramus, SVG to the diagonal 2, SVG to PDA 05/19/17.  He was put on Plavix because of his NSTEMI.  Saw Sharrell Ku, PA-C 06/03/17.  Labs 06/12/17 all stable.  He did have flank bruising and CT showed no evidence of retroperitoneal hemorrhage or other acute findings.  There was small left pleural effusion.  2 tiny adjacent right middle lobe pulmonary nodules largest measuring 5 mm.  No follow-up needed if patient is low risk.  33-month follow-up of patient is high risk.  Plavix was stopped by Dr. Darcey Nora.  Patient comes in today for follow-up.  He is complaining of myalgias from the Lipitor.  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  Dr. Darcey Nora told him he should return slowly.  He denies any chest pain, palpitations, dyspnea, dizziness or presyncope.  Occasionally he gets dyspnea on exertion but not recently.    Past Medical History:  Diagnosis Date  . CAD in native artery    a.  CABG x 4 utilizing LIMA ot LAD, SVG to Ramus, SVG to Diagonal #2, and SVG to PDA on 05/19/17.  . Chest pain 05/12/2017  . Coronary artery disease   . Essential hypertension   . 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 Hyperglycemia    a. A1C 5.7 in 04/2017.  Marland Kitchen Hyperlipidemia   . Medical history non-contributory   . Non-ST elevation (NSTEMI) myocardial infarction [facility])     Past Surgical History:  Procedure Laterality Date  . CARDIAC CATHETERIZATION  05/13/2017   Dr Linard Millers 111  . CORONARY ARTERY BYPASS GRAFT N/A 05/19/2017   Procedure: CORONARY ARTERY BYPASS GRAFTING (CABG) x 4;  Surgeon: Ivin Poot, MD;   Location: [place];  Service: Open Heart Surgery;  Laterality: N/A;  . HERNIA REPAIR    . KNEE SURGERY Left 2005  . LEFT HEART CATH AND CORONARY ANGIOGRAPHY N/A 05/13/2017   Procedure: LEFT HEART CATH AND CORONARY ANGIOGRAPHY;  Surgeon: Belva Crome, MD;  Location: [place] CV LAB;  Service: Cardiovascular;  Laterality: N/A;  . TEE WITHOUT CARDIOVERSION N/A 05/19/2017   Procedure: TRANSESOPHAGEAL ECHOCARDIOGRAM (TEE);  Surgeon: Prescott Gum, Collier Salina, MD;  Location: [place];  Service: Open Heart Surgery;  Laterality: N/A;  . TONSILLECTOMY    . ULTRASOUND GUIDANCE FOR VASCULAR ACCESS  05/13/2017   Procedure: Ultrasound Guidance For Vascular Access;  Surgeon: Belva Crome, MD;  Location: [place] CV LAB;  Service: Cardiovascular;;    Current Medications: Current Meds  Medication Sig  . aspirin EC 81 MG EC tablet Take 1 tablet (81 mg total) by mouth daily.  Marland Kitchen atorvastatin (LIPITOR) 80 MG tablet Take 1 tablet (80 mg total) by mouth daily at 6 PM.  . metoprolol tartrate (LOPRESSOR) 25 MG tablet Take 1 tablet (25 mg total) by mouth 2 (two) times daily.  Marland Kitchen venlafaxine XR (EFFEXOR-XR) 75 MG 24 hr capsule Take 75 mg by mouth daily.      Allergies:   Patient has no known allergies.   Social History  Socioeconomic History  . Marital status: Married    Spouse name: None  . Number of children: None  . Years of education: None  . Highest education level: None  Social Needs  . Financial resource strain: None  . Food insecurity - worry: None  . Food insecurity - inability: None  . Transportation needs - medical: No  . Transportation needs - non-medical: No  Occupational History  . Occupation: Painter  Tobacco Use  . Smoking status: Never Smoker  . Smokeless tobacco: Never Used  Substance and Sexual Activity  . Alcohol use: Yes    Comment: occ  . Drug use: No  . Sexual activity: None  Other Topics Concern  . None  Social History Narrative  . None     Family History:  [name]  family history includes Angina in his father; Coronary artery disease in his brother.   ROS:   Please see the history of present illness.    Review of Systems  Constitution: Negative.  HENT: Negative.   Cardiovascular: Negative.   Respiratory: Negative.   Endocrine: Negative.   Hematologic/Lymphatic: Negative.   Musculoskeletal: Positive for myalgias.  Gastrointestinal: Negative.   Genitourinary: Negative.   Neurological: Negative.    All other systems reviewed and are negative.   PHYSICAL EXAM:   VS:  BP 116/70   Pulse 65   Ht 5\' 8"  (1.727 m)   Wt 173 lb 1.9 oz (78.5 kg)   SpO2 96%   BMI 26.32 kg/m   Physical Exam  GEN: Well nourished, well developed, in no acute distress  Neck: no JVD, carotid bruits, or masses Cardiac:RRR; no murmurs, rubs, or gallops  Respiratory:  clear to auscultation bilaterally, normal work of breathing GI: soft, nontender, nondistended, + BS Ext: without cyanosis, clubbing, or edema, Good distal pulses bilaterally Neuro:  Alert and Oriented x 3 Psych: euthymic mood, full affect  Wt Readings from Last 3 Encounters:  07/14/17 173 lb 1.9 oz (78.5 kg)  06/25/17 172 lb (78 kg)  06/24/17 174 lb 9.7 oz (79.2 kg)      Studies/Labs Reviewed:   EKG:  EKG is not ordered today.    Recent Labs: 05/20/2017: Magnesium 2.5 06/03/2017: ALT 17; TSH 6.890 06/12/2017: BUN 14; Creatinine, Ser 0.87; Hemoglobin 12.0; Platelets 592; Potassium 4.1; Sodium 140   Lipid Panel    Component Value Date/Time   CHOL 210 (H) 05/12/2017 1832   TRIG 124 05/12/2017 1832   HDL 44 05/12/2017 1832   CHOLHDL 4.8 05/12/2017 1832   VLDL 25 05/12/2017 1832   LDLCALC 141 (H) 05/12/2017 1832    Additional studies/ records that were reviewed today include:  2D echo 05/13/17 Study Conclusions   - Left ventricle: The cavity size was normal. Wall thickness was   normal. Systolic function was normal. The estimated ejection   fraction was in the range of 55% to 60%. Wall  motion was normal;   there were no regional wall motion abnormalities. Features are   consistent with a pseudonormal left ventricular filling pattern,   with concomitant abnormal relaxation and increased filling   pressure (grade 2 diastolic dysfunction). - Aortic valve: There was no stenosis. - Mitral valve: There was trivial regurgitation. - Right ventricle: The cavity size was normal. Systolic function   was normal. - Tricuspid valve: Peak RV-RA gradient (S): 36 mm Hg. - Pulmonary arteries: PA peak pressure: 39 mm Hg (S). - Inferior vena cava: The vessel was normal in size. The   respirophasic  diameter changes were in the normal range (>= 50%),   consistent with normal central venous pressure.   Impressions:   - Normal LV size and systolic function, EF 53-29%. Moderate   diastolic dysfunction. Normal RV size and systolic function. Mild   pulmonary hypertension.  CT scan 06/06/17 iMPRESSION: No evidence of retroperitoneal hemorrhage or other acute findings within the abdomen or pelvis.   Small left pleural effusion, and left lower lobe atelectasis or consolidation.   Two tiny adjacent right middle lobe pulmonary nodules, largest measuring 5 mm. No follow-up needed if patient is low-risk. Non-contrast chest CT can be considered in 12 months if patient is high-risk. This recommendation follows the consensus statement: Guidelines for Management of Incidental Pulmonary Nodules Detected on CT Images: From the Fleischner Society 2017; Radiology 2017; 284:228-243.   Aortic and coronary artery atherosclerosis.      ASSESSMENT:    No diagnosis found.   PLAN:  In order of problems listed above:  CAD status post CABG 04/2017 after non-STEMI doing well.  Now off Plavix.  Doing cardiac rehab.  Has been cleared by Dr. 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  Having dental work today but already cleared by preop clinic.  Follow-up with Dr. Radford Pax in 2-3  months.  Hypertension blood pressure is good today.  Patient says in cardiac rehab sometimes it runs low but when he checks at home it is always around what we got today.  Continue Lopressor.  Hyperlipidemia complaining of myalgias.  We will decrease Lipitor to 40 mg today.  Check fasting lipid panel and LFTs today.  Patient did have a muffin but does not want to return for labs.  Thrombocytosis please see notes by Sharrell Ku who discussed with hematology.  Most recently platelets were down to 500 range.  We will repeat today.    Medication Adjustments/Labs and Tests Ordered: Current medicines are reviewed at length with the patient today.  Concerns regarding medicines are outlined above.  Medication changes, Labs and Tests ordered today are listed in the Patient Instructions below. There are no Patient Instructions on file for this visit.   Sumner Boast, PA-C  07/14/2017 10:42 AM    [facility] [place], [place], [place]  [address] Phone: [phone]; Fax: [phone]

## 2017-07-14 NOTE — Progress Notes (Signed)
I have reviewed a Home Exercise Prescription with Colbert Coyer . [name] is currently walking at home 2 days a week.  The patient was advised to walk 2-3 days a week for 30-45 minutes.  [name] and I discussed how to progress their exercise prescription. The patient stated that they understand the exercise prescription.  We reviewed exercise guidelines, target heart rate during exercise, weather, endpoints for exercise, and goals.  Patient is encouraged to come to me with any questions. I will continue to follow up with the patient to assist them with progression and safety.    Carma Lair MS, ACSM CEP 07/14/2017 1:49 PM

## 2017-07-18 ENCOUNTER — Telehealth (HOSPITAL_COMMUNITY): Payer: Self-pay | Admitting: Internal Medicine

## 2017-07-18 ENCOUNTER — Encounter (HOSPITAL_COMMUNITY): Payer: PPO

## 2017-07-24 ENCOUNTER — Encounter (HOSPITAL_COMMUNITY): Payer: Self-pay | Admitting: *Deleted

## 2017-07-24 ENCOUNTER — Telehealth (HOSPITAL_COMMUNITY): Payer: Self-pay | Admitting: *Deleted

## 2017-07-24 DIAGNOSIS — Z951 Presence of aortocoronary bypass graft: Secondary | ICD-10-CM

## 2017-07-24 NOTE — Progress Notes (Signed)
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  ADL [facility]:   Initial Exercise Prescription: Initial Exercise Prescription - 06/24/17 1200      Date of Initial Exercise RX and Referring Provider   Date  06/24/17    Referring Provider  Fransico Him MD      Treadmill   MPH  2.6    Grade  0    Minutes  10    METs  2.99      Bike   Level  0.7    Minutes  10    METs  2.65      NuStep   Level  3    SPM  80    Minutes  10    METs  2.5      Prescription Details   Frequency (times per week)  3    Duration  Progress to 30 minutes of continuous aerobic without signs/symptoms of physical distress      Intensity   THRR 40-80% of Max Heartrate  61-122    Ratings of Perceived Exertion  11-13    Perceived Dyspnea  0-4      Progression   Progression  Continue to progress workloads to maintain intensity without signs/symptoms of physical distress.      Resistance Training   Training Prescription  Yes    Weight  3lbs    Reps  10-15       Discharge Exercise Prescription (Final Exercise Prescription Changes): Exercise Prescription Changes - 07/15/17 1400      Response to Exercise   Blood Pressure (Admit)  106/62    Blood Pressure (Exercise)  126/60    Blood Pressure (Exit)  102/64    Heart Rate (Admit)  82 bpm    Heart Rate (Exercise)  111 bpm    Heart Rate (Exit)  70 bpm    Rating of Perceived Exertion (Exercise)  12    Symptoms  none    Duration  Progress to 30 minutes of  aerobic without signs/symptoms of physical  distress    Intensity  THRR New      Progression   Progression  Continue to progress workloads to maintain intensity without signs/symptoms of physical distress.    Average METs  3.1      Resistance Training   Training Prescription  Yes    Weight  5lbs    Reps  10-15    Time  10 Minutes      Treadmill   MPH  2.6    Grade  0    Minutes  10    METs  2.99      Bike   Level  1.2    Minutes  10    METs  2.68      NuStep   Level  3    SPM  80    Minutes  10    METs  2.4      Home Exercise Plan   Plans to continue  exercise at  Home (comment) walking    Frequency  Add 2 additional days to program exercise sessions.    Initial Home Exercises Provided  07/14/17       Functional Capacity: 6 Minute Walk    Row Name 06/24/17 1156         6 Minute Walk   Phase  Initial     Distance  1400 feet     Walk Time  6 minutes     # of Rest Breaks  0     MPH  2.65     METS  3.13     RPE  11     VO2 Peak  10.98     Symptoms  No     Resting HR  68 bpm     Resting BP  100/60     Resting Oxygen Saturation   96 %     Exercise Oxygen Saturation  during 6 min walk  96 %     Max Ex. HR  102 bpm     Max Ex. BP  114/62     2 Minute Post BP  92/64 105/62 with hydration        Psychological, QOL, Others - Outcomes: PHQ 2/9: Depression screen PHQ 2/9 06/30/2017  Decreased Interest 0  Down, Depressed, Hopeless 0  PHQ - 2 Score 0    Quality of Life: Quality of Life - 06/24/17 0852      Quality of Life Scores   Health/Function Pre  18.8 %    Socioeconomic Pre  24.38 %    Psych/Spiritual Pre  15.36 %    Family Pre  16.8 %    GLOBAL Pre  19.1 %       Personal Goals: Goals established at orientation with interventions provided to work toward goal. Personal Goals and Risk Factors at Admission - 06/24/17 1240      Core Components/Risk Factors/Patient Goals on Admission    Weight Management  Yes;Weight Maintenance    Intervention  Weight Management: Develop a combined  nutrition and exercise program designed to reach desired caloric intake, while maintaining appropriate intake of nutrient and fiber, sodium and fats, and appropriate energy expenditure required for the weight goal.;Weight Management: Provide education and appropriate resources to help participant work on and attain dietary goals.;Weight Management/Obesity: Establish reasonable short term and long term weight goals.    Admit Weight  174 lb 9.7 oz (79.2 kg)    Expected Outcomes  Weight Maintenance: Understanding of the daily nutrition guidelines, which includes 25-35% calories from fat, 7% or less cal from saturated fats, less than 200mg  cholesterol, less than 1.5gm of sodium, & 5 or more servings of fruits and vegetables daily    Hypertension  Yes    Intervention  Monitor prescription use compliance.;Provide education on lifestyle modifcations including regular physical activity/exercise, weight management, moderate sodium restriction and increased consumption of fresh fruit, vegetables, and low fat dairy, alcohol moderation, and smoking cessation.    Expected Outcomes  Short Term: Continued assessment and intervention until BP is < 140/71mm HG in hypertensive participants. < 130/64mm HG in hypertensive participants with diabetes, heart failure or chronic kidney disease.;Long Term: Maintenance of blood pressure at goal levels.    Lipids  Yes    Intervention  Provide education and support for participant on nutrition & aerobic/resistive exercise along with prescribed medications to achieve LDL 70mg , HDL >40mg .    Expected Outcomes  Short Term: Participant states understanding of desired cholesterol values  and is compliant with medications prescribed. Participant is following exercise prescription and nutrition guidelines.        Personal Goals Discharge:   Exercise Goals and Review: Exercise Goals    Row Name 06/24/17 4315 06/24/17 0855           Exercise Goals   Increase Physical Activity  Yes   -      Intervention  Provide advice, education, support and counseling about physical activity/exercise needs.;Develop an individualized exercise prescription for aerobic and resistive training based on initial evaluation findings, risk stratification, comorbidities and participant's personal goals.  -      Expected Outcomes  Achievement of increased cardiorespiratory fitness and enhanced flexibility, muscular endurance and strength shown through measurements of functional capacity and personal statement of participant.  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  -      Expected Outcomes  Achievement of increased cardiorespiratory fitness and enhanced flexibility, muscular endurance and strength shown through measurements of functional capacity and personal statement of participant.  -      Able to understand and use rate of perceived exertion (RPE) scale  Yes  -      Intervention  Provide education and explanation on how to use RPE scale  -      Expected Outcomes  Short Term: Able to use RPE daily in rehab to express subjective intensity level;Long Term:  Able to use RPE to guide intensity level when exercising independently  -      Knowledge and understanding of Target Heart Rate Range (THRR)  Yes  -      Intervention  Provide education and explanation of THRR including how the numbers were predicted and where they are located for reference  -      Expected Outcomes  Short Term: Able to state/look up THRR;Long Term: Able to use THRR to govern intensity when exercising independently;Short Term: Able to use daily as guideline for intensity in rehab  -      Able to  check pulse independently  Yes  -      Intervention  Provide education and demonstration on how to check pulse in carotid and radial arteries.;Review the importance of being able to check your own pulse for safety during independent exercise  -      Expected Outcomes  Short Term: Able to explain why pulse checking is important during independent exercise;Long Term: Able to check pulse independently and accurately  -      Understanding of Exercise Prescription  Yes  -      Intervention  Provide education, explanation, and written materials on patient's individual exercise prescription  -      Expected Outcomes  Short Term: Able to explain program exercise prescription;Long Term: Able to explain home exercise prescription to exercise independently  -         Nutrition & Weight - Outcomes: Pre Biometrics - 06/24/17 1243      Pre Biometrics   Waist Circumference  35 inches    Hip Circumference  39.5 inches    Waist to Hip Ratio  0.89 %    Triceps Skinfold  15 mm    % Body  Fat  25.1 %    Grip Strength  33 kg    Flexibility  14 in    Single Leg Stand  30 seconds        Nutrition: Nutrition Therapy & Goals - 06/30/17 1100      Nutrition Therapy   Diet  Heart Healthy      Personal Nutrition Goals   Nutrition Goal  Pt to identify and limit food sources of saturated fat, trans fat, and sodium    Personal Goal #2  Pt to identify food quantities necessary to achieve weight loss of 6-10 lb at graduation from cardiac rehab.       Intervention Plan   Intervention  Prescribe, educate and counsel regarding individualized specific dietary modifications aiming towards targeted core components such as weight, hypertension, lipid management, diabetes, heart failure and other comorbidities.    Expected Outcomes  Short Term Goal: Understand basic principles of dietary content, such as calories, fat, sodium, cholesterol and nutrients.;Long Term Goal: Adherence to prescribed nutrition plan.        Nutrition Discharge: Nutrition Assessments - 07/11/17 1211      MEDFICTS Scores   Pre Score  50       Education Questionnaire Score: Knowledge Questionnaire Score - 06/24/17 0851      Knowledge Questionnaire Score   Pre Score  21/24       Jim attended 7 exercise sessions and does not plan to return to exercise. [name] plans to return to work and will exercise on his own. Jim's did well with exercise during his attendance in the program. [name], RN,BSN 08/07/2017 4:57 PM

## 2017-07-25 ENCOUNTER — Encounter (HOSPITAL_COMMUNITY): Payer: PPO

## 2017-07-26 ENCOUNTER — Other Ambulatory Visit: Payer: Self-pay | Admitting: Surgical

## 2017-07-28 ENCOUNTER — Encounter (HOSPITAL_COMMUNITY): Payer: PPO

## 2017-08-01 ENCOUNTER — Encounter (HOSPITAL_COMMUNITY): Payer: PPO

## 2017-08-04 ENCOUNTER — Encounter (HOSPITAL_COMMUNITY): Payer: PPO

## 2017-08-08 ENCOUNTER — Encounter (HOSPITAL_COMMUNITY): Payer: PPO

## 2017-08-11 ENCOUNTER — Encounter (HOSPITAL_COMMUNITY): Payer: PPO

## 2017-08-15 ENCOUNTER — Encounter (HOSPITAL_COMMUNITY): Payer: PPO

## 2017-08-18 ENCOUNTER — Encounter (HOSPITAL_COMMUNITY): Payer: PPO

## 2017-08-22 ENCOUNTER — Encounter (HOSPITAL_COMMUNITY): Payer: PPO

## 2017-08-25 ENCOUNTER — Encounter (HOSPITAL_COMMUNITY): Payer: PPO

## 2017-08-29 ENCOUNTER — Encounter (HOSPITAL_COMMUNITY): Payer: PPO

## 2017-09-01 ENCOUNTER — Encounter (HOSPITAL_COMMUNITY): Payer: PPO

## 2017-09-05 ENCOUNTER — Encounter (HOSPITAL_COMMUNITY): Payer: PPO

## 2017-09-08 ENCOUNTER — Encounter (HOSPITAL_COMMUNITY): Payer: PPO

## 2017-09-12 ENCOUNTER — Encounter (HOSPITAL_COMMUNITY): Payer: PPO

## 2017-09-15 ENCOUNTER — Encounter (HOSPITAL_COMMUNITY): Payer: PPO

## 2017-09-19 ENCOUNTER — Encounter: Payer: Self-pay | Admitting: Cardiology

## 2017-09-19 ENCOUNTER — Ambulatory Visit: Payer: PPO | Admitting: Cardiology

## 2017-09-19 ENCOUNTER — Encounter (HOSPITAL_COMMUNITY): Payer: PPO

## 2017-09-19 VITALS — BP 104/64 | HR 61 | Ht 68.0 in | Wt 175.4 lb

## 2017-09-19 DIAGNOSIS — E785 Hyperlipidemia, unspecified: Secondary | ICD-10-CM | POA: Diagnosis not present

## 2017-09-19 DIAGNOSIS — R002 Palpitations: Secondary | ICD-10-CM

## 2017-09-19 DIAGNOSIS — I10 Essential (primary) hypertension: Secondary | ICD-10-CM | POA: Diagnosis not present

## 2017-09-19 DIAGNOSIS — I2510 Atherosclerotic heart disease of native coronary artery without angina pectoris: Secondary | ICD-10-CM | POA: Diagnosis not present

## 2017-09-19 NOTE — Progress Notes (Signed)
Cardiology Office Note:    Date:  09/19/2017   ID:  Jason Lewis, DOB [birth year], MRN [id number]  PCP:  Deland Pretty, MD  Cardiologist:  Fransico Him, MD    Referring MD: Deland Pretty, MD   Chief Complaint  Patient presents with  . Coronary Artery Disease  . Hypertension  . Hyperlipidemia    History of Present Illness:    Jason Lewis is a 67 y.o. 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  He was put on Plavix because of his NSTEMI.     He had been on high-dose atorvastatin but developed myalgias and his Lipitor was decreased to 40 mg daily.  Repeat lipid panel showed an LDL at goal at 48, HDL 35 triglycerides 110.  This was done December 2018.  He is here today for followup and is doing well.  He denies any chest pain or pressure, SOB, DOE, PND, orthopnea, LE edema, dizziness or syncope. He has noticed a fluttering sensation that occurs several times a week lasting about 10 minutes at a time.  He is compliant with his meds and is tolerating meds with no SE  Past Medical History:  Diagnosis Date  . CAD in native artery    a.  CABG x 4 utilizing LIMA ot LAD, SVG to Ramus, SVG to Diagonal #2, and SVG to PDA on 05/19/17.  . Chest pain 05/12/2017  . Essential hypertension   . 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 Hyperglycemia    a. A1C 5.7 in 04/2017.  Marland Kitchen Hyperlipidemia   . Medical history non-contributory   . Non-ST elevation (NSTEMI) myocardial infarction [facility])     Past Surgical History:  Procedure Laterality Date  . CARDIAC CATHETERIZATION  05/13/2017   Dr Linard Millers 111  . CORONARY ARTERY BYPASS GRAFT N/A 05/19/2017   Procedure: CORONARY ARTERY BYPASS GRAFTING (CABG) x 4;  Surgeon: Ivin Poot, MD;  Location: [facility];  Service: Open Heart Surgery;  Laterality: N/A;  . HERNIA REPAIR    . KNEE SURGERY Left 2005  . LEFT HEART CATH AND CORONARY ANGIOGRAPHY N/A  05/13/2017   Procedure: LEFT HEART CATH AND CORONARY ANGIOGRAPHY;  Surgeon: Belva Crome, MD;  Location: [place] [facility];  Service: Cardiovascular;  Laterality: N/A;  . TEE WITHOUT CARDIOVERSION N/A 05/19/2017   Procedure: TRANSESOPHAGEAL ECHOCARDIOGRAM (TEE);  Surgeon: Prescott Gum, Collier Salina, MD;  Location: [place];  Service: Open Heart Surgery;  Laterality: N/A;  . TONSILLECTOMY    . ULTRASOUND GUIDANCE FOR VASCULAR ACCESS  05/13/2017   Procedure: Ultrasound Guidance For Vascular Access;  Surgeon: Belva Crome, MD;  Location: [facility];  Service: Cardiovascular;;    Current Medications: No outpatient medications have been marked as taking for the 09/19/17 encounter (Office Visit) with Sueanne Margarita, MD.     Allergies:   Patient has no known allergies.   Social History   Socioeconomic History  . Marital status: Married    Spouse name: None  . Number of children: None  . Years of education: None  . Highest education level: None  Social Needs  . Financial resource strain: None  . Food insecurity - worry: None  . Food insecurity - inability: None  . Transportation needs - medical: No  . Transportation needs - non-medical: No  Occupational History  . Occupation: [employer]  Tobacco Use  . Smoking status: Never Smoker  . Smokeless tobacco: Never Used  Substance and Sexual Activity  . Alcohol use: Yes    Comment: occ  . Drug use: No  . Sexual activity: None  Other Topics Concern  . None  Social History Narrative  . None     Family History: [name] family history includes Angina in his father; Coronary artery disease in his brother.  ROS:   Please see the history of present illness.    ROS  All other systems reviewed and negative.   EKGs/Labs/Other Studies Reviewed:    The following studies were reviewed today: none  EKG:  EKG is not ordered today.   Recent Labs: 05/20/2017: Magnesium 2.5 06/03/2017: TSH 6.890 06/12/2017: BUN 14; Creatinine, Ser  0.87; Potassium 4.1; Sodium 140 07/14/2017: ALT 16; Hemoglobin 13.3; Platelets 344   Recent Lipid Panel    Component Value Date/Time   CHOL 105 07/14/2017 1054   TRIG 110 07/14/2017 1054   HDL 35 (L) 07/14/2017 1054   CHOLHDL 3.0 07/14/2017 1054   CHOLHDL 4.8 05/12/2017 1832   VLDL 25 05/12/2017 1832   LDLCALC 48 07/14/2017 1054    Physical Exam:    VS:  There were no vitals taken for this visit.    Wt Readings from Last 3 Encounters:  07/14/17 173 lb 1.9 oz (78.5 kg)  06/25/17 172 lb (78 kg)  06/24/17 174 lb 9.7 oz (79.2 kg)     GEN:  Well nourished, well developed in no acute distress HEENT: Normal NECK: No JVD; No carotid bruits LYMPHATICS: No lymphadenopathy CARDIAC: RRR, no murmurs, rubs, gallops RESPIRATORY:  Clear to auscultation without rales, wheezing or rhonchi  ABDOMEN: Soft, non-tender, non-distended MUSCULOSKELETAL:  No edema; No deformity  SKIN: Warm and dry NEUROLOGIC:  Alert and oriented x 3 PSYCHIATRIC:  Normal affect   ASSESSMENT:    1. Coronary artery disease involving native coronary artery of native heart without angina pectoris   2. Essential hypertension   3. Hyperlipidemia LDL goal <70   4. Palpitation    PLAN:    In order of problems listed above:  1.  ASCAD - status post NSTEMI followed by CABG x4 with LIMA to the LAD, SVG to the ramus, SVG to the diagonal 2, SVG to PDA 05/19/17.  He denies any anginal symptoms.He will continue on aspirin 81 mg daily, statin and beta-blocker.  2.  HTN -blood pressure is well controlled on exam today.  He will continue on metoprolol 25 mg twice daily.  3.  Hyperlipidemia with LDL goal less than 70- he will continue on atorvastatin 40 mg daily .  His myalgias have improved on a lower dose of statin but still gets pain in his knees.  I have told him to hold his atorvastatin for 2 weeks and call with the results.  I will get an FLP and ALT.  4.  Palpitations - he has been complaining of intermittent  fluttering in his chest several days weekly lasting about 10 minutes.  I will get an event monitor to make sure he is not having PAF.   Medication Adjustments/Labs and Tests Ordered: Current medicines are reviewed at length with the patient today.  Concerns regarding medicines are outlined above.  No orders of the defined types were placed in this encounter.  No orders of the defined types were placed in this encounter.   Signed, Fransico Him, MD  09/19/2017 3:35 PM    [facility]

## 2017-09-19 NOTE — Patient Instructions (Signed)
Medication Instructions:  Your physician recommends that you continue on your current medications as directed. Please refer to the Current Medication list given to you today.  If you need a refill on your cardiac medications, please contact your pharmacy first.  Labwork: Your physician recommends that you return for lab work in: 1 week for fasting lipids and liver function test   Testing/Procedures: Your physician has recommended that you wear an event monitor. Event monitors are medical devices that record the heart's electrical activity. Doctors most often [place] these monitors to diagnose arrhythmias. Arrhythmias are problems with the speed or rhythm of the heartbeat. The monitor is a small, portable device. You can wear one while you do your normal daily activities. This is usually used to diagnose what is causing palpitations/syncope (passing out).  Follow-Up: Your physician wants you to follow-up in: 6 months with PA. You will receive a reminder letter in the mail two months in advance. If you don't receive a letter, please call our office to schedule the follow-up appointment.  Your physician wants you to follow-up in: 1 year with Dr. Radford Pax. You will receive a reminder letter in the mail two months in advance. If you don't receive a letter, please call our office to schedule the follow-up appointment.  Any Other Special Instructions Will Be Listed Below (If Applicable).   Thank you for choosing [facility], RN  [phone]  If you need a refill on your cardiac medications before your next appointment, please call your pharmacy.

## 2017-09-22 ENCOUNTER — Encounter (HOSPITAL_COMMUNITY): Payer: PPO

## 2017-09-26 ENCOUNTER — Other Ambulatory Visit: Payer: PPO | Admitting: *Deleted

## 2017-09-26 ENCOUNTER — Encounter (HOSPITAL_COMMUNITY): Payer: PPO

## 2017-09-26 DIAGNOSIS — E785 Hyperlipidemia, unspecified: Secondary | ICD-10-CM

## 2017-09-26 LAB — LIPID PANEL
Chol/HDL Ratio: 2.8 ratio (ref 0.0–5.0)
Cholesterol, Total: 113 mg/dL (ref 100–199)
HDL: 41 mg/dL (ref >39)
LDL Calculated: 57 mg/dL (ref 0–99)
TRIGLYCERIDES: 73 mg/dL (ref 0–149)
VLDL Cholesterol Cal: 15 mg/dL (ref 5–40)

## 2017-09-26 LAB — HEPATIC FUNCTION PANEL
ALT: 20 IU/L (ref 0–44)
AST: 17 IU/L (ref 0–40)
Albumin: 4.1 g/dL (ref 3.6–4.8)
Alkaline Phosphatase: 88 IU/L (ref 39–117)
Bilirubin Total: 0.7 mg/dL (ref 0.0–1.2)
Bilirubin, Direct: 0.2 mg/dL (ref 0.00–0.40)
Total Protein: 5.9 g/dL — ABNORMAL LOW (ref 6.0–8.5)

## 2017-09-29 ENCOUNTER — Encounter (HOSPITAL_COMMUNITY): Payer: PPO

## 2017-10-01 ENCOUNTER — Ambulatory Visit (INDEPENDENT_AMBULATORY_CARE_PROVIDER_SITE_OTHER): Payer: PPO

## 2017-10-01 DIAGNOSIS — R002 Palpitations: Secondary | ICD-10-CM

## 2017-10-03 ENCOUNTER — Encounter (HOSPITAL_COMMUNITY): Payer: PPO

## 2017-10-23 DIAGNOSIS — R5383 Other fatigue: Secondary | ICD-10-CM | POA: Diagnosis not present

## 2017-10-23 DIAGNOSIS — I2510 Atherosclerotic heart disease of native coronary artery without angina pectoris: Secondary | ICD-10-CM | POA: Diagnosis not present

## 2017-10-23 DIAGNOSIS — F329 Major depressive disorder, single episode, unspecified: Secondary | ICD-10-CM | POA: Diagnosis not present

## 2017-10-23 DIAGNOSIS — M2550 Pain in unspecified joint: Secondary | ICD-10-CM | POA: Diagnosis not present

## 2017-10-29 ENCOUNTER — Other Ambulatory Visit: Payer: Self-pay | Admitting: Surgical

## 2017-11-06 DIAGNOSIS — F329 Major depressive disorder, single episode, unspecified: Secondary | ICD-10-CM | POA: Diagnosis not present

## 2017-11-06 DIAGNOSIS — I2510 Atherosclerotic heart disease of native coronary artery without angina pectoris: Secondary | ICD-10-CM | POA: Diagnosis not present

## 2017-12-04 ENCOUNTER — Ambulatory Visit: Payer: PPO | Admitting: Interventional Cardiology

## 2017-12-08 DIAGNOSIS — E7800 Pure hypercholesterolemia, unspecified: Secondary | ICD-10-CM | POA: Diagnosis not present

## 2017-12-08 DIAGNOSIS — Z23 Encounter for immunization: Secondary | ICD-10-CM | POA: Diagnosis not present

## 2017-12-08 DIAGNOSIS — F329 Major depressive disorder, single episode, unspecified: Secondary | ICD-10-CM | POA: Diagnosis not present

## 2018-04-28 DIAGNOSIS — M170 Bilateral primary osteoarthritis of knee: Secondary | ICD-10-CM | POA: Diagnosis not present

## 2018-04-28 DIAGNOSIS — M25562 Pain in left knee: Secondary | ICD-10-CM | POA: Diagnosis not present

## 2018-04-28 DIAGNOSIS — M25561 Pain in right knee: Secondary | ICD-10-CM | POA: Diagnosis not present

## 2018-08-25 DIAGNOSIS — M25562 Pain in left knee: Secondary | ICD-10-CM | POA: Diagnosis not present

## 2018-09-21 NOTE — Progress Notes (Signed)
Cardiology Office Note    Date:  09/22/2018   ID:  [name], DOB 05-Dec-1950, MRN [id number]  PCP:  Deland Pretty, MD  Cardiologist: Fransico Him, MD EPS: None  Chief Complaint  Patient presents with  . Follow-up    History of Present Illness:  Jason Lewis is a 68 y.o. male with history of CAD status post NSTEMI followed by CABG times 4-10/22/18 with LIMA to the LAD, SVG to the ramus, SVG to diagonal 2, SVG to PDA.  He was placed on Plavix because of NSTEMI.  Normal LVEF 55 to 60% on TEE Intra-Op.  Patient also has hypertension, HLD tolerating lower dose statin.  Patient saw Dr. Radford Pax 09/19/2017 complaining of a fluttering in his heart.  Heart monitor showed normal sinus rhythm with rare PVCs.  Patient comes in for yearly f/u accompanied by his wife. Has sensation of heart skipping at times that comes and goes with a little discomfort in his chest. Not exertional. More short of breath since CABG and is disappointed with how he feels.Notices he gets short of breath going up a flight of stairs.  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 No symptoms with working. Notices more when sitting on the couch. Can't walk/run because of knee problem. Drinks 2-32 ounces of mountain dew daily. Also has a lot of anxiety.Stopped effexor last month because of violent dreams.      Past Medical History:  Diagnosis Date  . CAD in native artery    a.  CABG x 4 utilizing LIMA ot LAD, SVG to Ramus, SVG to Diagonal #2, and SVG to PDA on 05/19/17.  . Chest pain 05/12/2017  . Essential hypertension   . 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 Hyperglycemia    a. A1C 5.7 in 04/2017.  Marland Kitchen Hyperlipidemia   . Medical history non-contributory   . Non-ST elevation (NSTEMI) myocardial infarction [facility])     Past Surgical History:  Procedure Laterality Date  . CARDIAC CATHETERIZATION  05/13/2017   Dr Linard Millers 111  . CORONARY ARTERY BYPASS GRAFT N/A 05/19/2017   Procedure: CORONARY ARTERY BYPASS  GRAFTING (CABG) x 4;  Surgeon: Ivin Poot, MD;  Location: [place];  Service: Open Heart Surgery;  Laterality: N/A;  . HERNIA REPAIR    . KNEE SURGERY Left 2005  . LEFT HEART CATH AND CORONARY ANGIOGRAPHY N/A 05/13/2017   Procedure: LEFT HEART CATH AND CORONARY ANGIOGRAPHY;  Surgeon: Belva Crome, MD;  Location: [place] [facility];  Service: Cardiovascular;  Laterality: N/A;  . TEE WITHOUT CARDIOVERSION N/A 05/19/2017   Procedure: TRANSESOPHAGEAL ECHOCARDIOGRAM (TEE);  Surgeon: Prescott Gum, Collier Salina, MD;  Location: [place];  Service: Open Heart Surgery;  Laterality: N/A;  . TONSILLECTOMY    . ULTRASOUND GUIDANCE FOR VASCULAR ACCESS  05/13/2017   Procedure: Ultrasound Guidance For Vascular Access;  Surgeon: Belva Crome, MD;  Location: [place] [facility];  Service: Cardiovascular;;    Current Medications: Current Meds  Medication Sig  . aspirin EC 81 MG EC tablet Take 1 tablet (81 mg total) by mouth daily.  Marland Kitchen atorvastatin (LIPITOR) 40 MG tablet Take 1 tablet (40 mg total) by mouth daily.  . Metoprolol Succinate 25 MG CS24 Take 25 mg by mouth daily.     Allergies:   Patient has no known allergies.   Social History   Socioeconomic History  . Marital status: Married    Spouse name: Not on file  . Number  of children: Not on file  . Years of education: Not on file  . Highest education level: Not on file  Occupational History  . Occupation: [employer]  . Financial resource strain: Not on file  . Food insecurity:    Worry: Not on file    Inability: Not on file  . Transportation needs:    Medical: No    Non-medical: No  Tobacco Use  . Smoking status: Never Smoker  . Smokeless tobacco: Never Used  Substance and Sexual Activity  . Alcohol use: Yes    Comment: occ  . Drug use: No  . Sexual activity: Not on file  Lifestyle  . Physical activity:    Days per week: 0 days    Minutes per session: 0 min  . Stress: Only a little  Relationships  . Social connections:     Talks on phone: Not on file    Gets together: Not on file    Attends religious service: Not on file    Active member of club or organization: Not on file    Attends meetings of clubs or organizations: Not on file    Relationship status: Married  Other Topics Concern  . Not on file  Social History Narrative  . Not on file     Family History:  [name] family history includes Angina in his father; Coronary artery disease in his brother.   ROS:   Please see the history of present illness.    Review of Systems  Constitution: Negative.  HENT: Negative.   Cardiovascular: Positive for palpitations.  Respiratory: Negative.   Endocrine: Negative.   Hematologic/Lymphatic: Negative.   Musculoskeletal: Negative.   Gastrointestinal: Negative.   Genitourinary: Negative.   Neurological: Negative.   Psychiatric/Behavioral: The patient is nervous/anxious.    All other systems reviewed and are negative.   PHYSICAL EXAM:   VS:  BP 122/70   Pulse (!) 57   Ht 5\' 8"  (1.727 m)   Wt 184 lb 8 oz (83.7 kg)   SpO2 98%   BMI 28.05 kg/m   Physical Exam  GEN: Well nourished, well developed, in no acute distress  Neck: no JVD, carotid bruits, or masses Cardiac:RRR; no murmurs, rubs, or gallops  Respiratory:  clear to auscultation bilaterally, normal work of breathing GI: soft, nontender, nondistended, + BS Ext: without cyanosis, clubbing, or edema, Good distal pulses bilaterally Neuro:  Alert and Oriented x 3 Psych: euthymic mood, full affect  Wt Readings from Last 3 Encounters:  09/22/18 184 lb 8 oz (83.7 kg)  09/19/17 175 lb 6.4 oz (79.6 kg)  07/14/17 173 lb 1.9 oz (78.5 kg)      Studies/Labs Reviewed:   EKG:  EKG is  ordered today.  The ekg ordered today demonstrates sinus bradycardia at 57 bpm otherwise normal  Recent Labs: 09/26/2017: ALT 20   Lipid Panel    Component Value Date/Time   CHOL 113 09/26/2017 0751   TRIG 73 09/26/2017 0751   HDL 41 09/26/2017 0751   CHOLHDL  2.8 09/26/2017 0751   CHOLHDL 4.8 05/12/2017 1832   VLDL 25 05/12/2017 1832   LDLCALC 57 09/26/2017 0751    Additional studies/ records that were reviewed today include:  Intra-Op TEE 10/2018Result status: In process   Left ventricle: Normal cavity size. LV systolic function is normal with an EF of 55-60%.  Septum: No Patent Foramen Ovale present.  Left atrium: Patent foramen ovale not present.  Aortic valve: The valve is  trileaflet. No regurgitation.  Mitral valve: Mild regurgitation.  Right ventricle: Normal ejection fraction. Cavity is mildly dilated. Catheter present in the ventricle.  Tricuspid valve: Mild to moderate regurgitation. The tricuspid valve regurgitation jet is central.  Pulmonic valve: Trace regurgitation.       ASSESSMENT:    1. Coronary artery disease involving native coronary artery of native heart without angina pectoris   2. Essential hypertension   3. Hyperlipidemia LDL goal <70   4. DOE (dyspnea on exertion)   5. Palpitation      PLAN:  In order of problems listed above:   CAD status post NSTEMI followed by CABG x4-04/2017 with dyspnea on exertion since bypass seems to have worsened.  Would like to proceed with exercise Myoview to rule out ischemia.  Follow-up with me after testing  Essential hypertension blood pressure well controlled on low-dose Toprol.  Hold for stress test  Hyperlipidemia on atorvastatin 40 mg daily will check fasting lipid panel and surveillance labs  Palpitations and anxiety occur at rest when he is sitting on the couch at home.  Drinking a lot of [facility].  Have asked him to reduce the amount of caffeine and sugar in his diet and if he continues to have palpitations after this we can place a monitor.  Last year monitor showed PVCs.  Also stopped his Effexor without a wean.  Asked him to contact PCP about this.  Check TSH and surveillance labs.  Medication Adjustments/Labs and Tests Ordered: Current medicines are  reviewed at length with the patient today.  Concerns regarding medicines are outlined above.  Medication changes, Labs and Tests ordered today are listed in the Patient Instructions below. Patient Instructions  Medication Instructions:  Your physician recommends that you continue on your current medications as directed. Please refer to the Current Medication list given to you today.  If you need a refill on your cardiac medications before your next appointment, please call your pharmacy.   Lab work: Your physician recommends that you return for a FASTING lipid profile, complete metabolic panel, complete blood count, and thyroid function panel   If you have labs (blood work) drawn today and your tests are completely normal, you will receive your results only by: Marland Kitchen MyChart Message (if you have MyChart) OR . A paper copy in the mail If you have any lab test that is abnormal or we need to change your treatment, we will call you to review the results.  Testing/Procedures: Your physician has requested that you have en exercise stress myoview. For further information please visit [email or web address]. Please follow instruction sheet, as given.  Follow-Up: . Follow up with Ermalinda Barrios, PA after test  Any Other Special Instructions Will Be Listed Below (If Applicable).  DECREASE the amount of caffeine and sugars in your diet     Signed, Ermalinda Barrios, PA-C  09/22/2018 8:52 AM    [address], [place], [place]  [address] Phone: [phone]; Fax: [phone]

## 2018-09-22 ENCOUNTER — Ambulatory Visit: Payer: PPO | Admitting: Physician Assistant

## 2018-09-22 ENCOUNTER — Encounter: Payer: Self-pay | Admitting: Physician Assistant

## 2018-09-22 VITALS — BP 122/70 | HR 57 | Ht 68.0 in | Wt 184.5 lb

## 2018-09-22 DIAGNOSIS — I2510 Atherosclerotic heart disease of native coronary artery without angina pectoris: Secondary | ICD-10-CM

## 2018-09-22 DIAGNOSIS — R0609 Other forms of dyspnea: Secondary | ICD-10-CM | POA: Diagnosis not present

## 2018-09-22 DIAGNOSIS — E785 Hyperlipidemia, unspecified: Secondary | ICD-10-CM

## 2018-09-22 DIAGNOSIS — I10 Essential (primary) hypertension: Secondary | ICD-10-CM | POA: Diagnosis not present

## 2018-09-22 DIAGNOSIS — R002 Palpitations: Secondary | ICD-10-CM

## 2018-09-22 NOTE — Patient Instructions (Signed)
Medication Instructions:  Your physician recommends that you continue on your current medications as directed. Please refer to the Current Medication list given to you today.  If you need a refill on your cardiac medications before your next appointment, please call your pharmacy.   Lab work: Your physician recommends that you return for a FASTING lipid profile, complete metabolic panel, complete blood count, and thyroid function panel   If you have labs (blood work) drawn today and your tests are completely normal, you will receive your results only by: Marland Kitchen MyChart Message (if you have MyChart) OR . A paper copy in the mail If you have any lab test that is abnormal or we need to change your treatment, we will call you to review the results.  Testing/Procedures: Your physician has requested that you have en exercise stress myoview. For further information please visit [email or web address]. Please follow instruction sheet, as given.  Follow-Up: . Follow up with Ermalinda Barrios, PA after test  Any Other Special Instructions Will Be Listed Below (If Applicable).  DECREASE the amount of caffeine and sugars in your diet

## 2018-09-24 ENCOUNTER — Telehealth (HOSPITAL_COMMUNITY): Payer: Self-pay | Admitting: *Deleted

## 2018-09-24 ENCOUNTER — Encounter (HOSPITAL_COMMUNITY): Payer: Self-pay | Admitting: *Deleted

## 2018-09-24 NOTE — Telephone Encounter (Signed)
Left message on voicemail in reference to upcoming appointment scheduled for 09/29/18. Phone number given for a call back so details instructions can be given.  Jason Lewis

## 2018-09-29 ENCOUNTER — Encounter (HOSPITAL_COMMUNITY): Payer: PPO

## 2018-09-29 ENCOUNTER — Other Ambulatory Visit: Payer: PPO

## 2018-10-06 ENCOUNTER — Telehealth (HOSPITAL_COMMUNITY): Payer: Self-pay | Admitting: *Deleted

## 2018-10-06 NOTE — Telephone Encounter (Signed)
Left message on voicemail in reference to upcoming appointment scheduled for 10/12/18. Phone number given for a call back so details instructions can be given. Kirstie Peri, RN

## 2018-10-07 ENCOUNTER — Telehealth (HOSPITAL_COMMUNITY): Payer: Self-pay | Admitting: *Deleted

## 2018-10-07 ENCOUNTER — Ambulatory Visit: Payer: PPO | Admitting: Physician Assistant

## 2018-10-07 NOTE — Telephone Encounter (Signed)
Left message on voicemail per DPR in reference to upcoming appointment scheduled on 10/12/18 at 0730 with detailed instructions given per Myocardial Perfusion Study Information Sheet for the test. LM to arrive 15 minutes early, and that it is imperative to arrive on time for appointment to keep from having the test rescheduled. If you need to cancel or reschedule your appointment, please call the office within 24 hours of your appointment. Failure to do so may result in a cancellation of your appointment, and a $50 no show fee. Phone number given for call back for any questions. Everlena Mackley, Lucius Conn letter sent

## 2018-10-12 ENCOUNTER — Other Ambulatory Visit: Payer: Self-pay

## 2018-10-12 ENCOUNTER — Other Ambulatory Visit: Payer: PPO | Admitting: *Deleted

## 2018-10-12 ENCOUNTER — Ambulatory Visit (HOSPITAL_COMMUNITY): Payer: PPO | Attending: Cardiovascular Disease

## 2018-10-12 DIAGNOSIS — R0609 Other forms of dyspnea: Secondary | ICD-10-CM

## 2018-10-12 DIAGNOSIS — I2510 Atherosclerotic heart disease of native coronary artery without angina pectoris: Secondary | ICD-10-CM

## 2018-10-12 DIAGNOSIS — E785 Hyperlipidemia, unspecified: Secondary | ICD-10-CM

## 2018-10-12 DIAGNOSIS — I10 Essential (primary) hypertension: Secondary | ICD-10-CM

## 2018-10-12 LAB — COMPREHENSIVE METABOLIC PANEL
A/G RATIO: 2.6 — AB (ref 1.2–2.2)
ALBUMIN: 4.1 g/dL (ref 3.8–4.8)
ALT: 21 IU/L (ref 0–44)
AST: 18 IU/L (ref 0–40)
Alkaline Phosphatase: 69 IU/L (ref 39–117)
BUN/Creatinine Ratio: 17 (ref 10–24)
BUN: 17 mg/dL (ref 8–27)
Bilirubin Total: 0.8 mg/dL (ref 0.0–1.2)
CHLORIDE: 104 mmol/L (ref 96–106)
CO2: 23 mmol/L (ref 20–29)
Calcium: 8.9 mg/dL (ref 8.6–10.2)
Creatinine, Ser: 1.01 mg/dL (ref 0.76–1.27)
GFR calc Af Amer: 88 mL/min/{1.73_m2} (ref >59)
GFR, EST NON AFRICAN AMERICAN: 76 mL/min/{1.73_m2} (ref >59)
Globulin, Total: 1.6 g/dL (ref 1.5–4.5)
Glucose: 102 mg/dL — ABNORMAL HIGH (ref 65–99)
Potassium: 4.3 mmol/L (ref 3.5–5.2)
Sodium: 141 mmol/L (ref 134–144)
Total Protein: 5.7 g/dL — ABNORMAL LOW (ref 6.0–8.5)

## 2018-10-12 LAB — MYOCARDIAL PERFUSION IMAGING
Estimated workload: 8.8 METS
Exercise duration (min): 7 min
Exercise duration (sec): 10 s
LV sys vol: 41 mL
LVDIAVOL: 91 mL (ref 62–150)
MPHR: 152 {beats}/min
Peak HR: 139 {beats}/min
Percent HR: 91 %
Rest HR: 48 {beats}/min
SDS: 2
SRS: 1
SSS: 3
TID: 0.85

## 2018-10-12 LAB — CBC
HEMATOCRIT: 39.4 % (ref 37.5–51.0)
Hemoglobin: 13.3 g/dL (ref 13.0–17.7)
MCH: 31.4 pg (ref 26.6–33.0)
MCHC: 33.8 g/dL (ref 31.5–35.7)
MCV: 93 fL (ref 79–97)
Platelets: 250 10*3/uL (ref 150–450)
RBC: 4.24 x10E6/uL (ref 4.14–5.80)
RDW: 12 % (ref 11.6–15.4)
WBC: 6.3 10*3/uL (ref 3.4–10.8)

## 2018-10-12 LAB — TSH: TSH: 4.87 u[IU]/mL — ABNORMAL HIGH (ref 0.450–4.500)

## 2018-10-12 LAB — LIPID PANEL
Chol/HDL Ratio: 2.6 ratio (ref 0.0–5.0)
Cholesterol, Total: 113 mg/dL (ref 100–199)
HDL: 44 mg/dL (ref >39)
LDL Calculated: 51 mg/dL (ref 0–99)
Triglycerides: 89 mg/dL (ref 0–149)
VLDL Cholesterol Cal: 18 mg/dL (ref 5–40)

## 2018-10-12 MED ORDER — TECHNETIUM TC 99M TETROFOSMIN IV KIT
10.2000 | PACK | Freq: Once | INTRAVENOUS | Status: AC | PRN
  Administered 2018-10-12: 10.2 via INTRAVENOUS
  Filled 2018-10-12: qty 11

## 2018-10-12 MED ORDER — TECHNETIUM TC 99M TETROFOSMIN IV KIT
31.3000 | PACK | Freq: Once | INTRAVENOUS | Status: AC | PRN
  Administered 2018-10-12: 31.3 via INTRAVENOUS
  Filled 2018-10-12: qty 32

## 2018-10-14 ENCOUNTER — Telehealth: Payer: Self-pay | Admitting: Physician Assistant

## 2018-10-14 NOTE — Telephone Encounter (Signed)
Patient concerning his upcoming appointment with me 10/20/2018.  Patient had a normal nuclear stress test and labs were normal.  He says he is overall feeling better and feels comfortable with his normal results.  His palpitations have eased some.  He admits to not cutting back on his caffeine or sugar and says he will try to do better.  He is comfortable rescheduling due to the coronavirus.  Will cancel his appointment for 10/20/2018 and I am happy to see him back in 3 to 4 months.

## 2018-10-14 NOTE — Progress Notes (Deleted)
Cardiology Office Note    Date:  10/14/2018   ID:  Jason Lewis, DOB [birth year], MRN [id number]  PCP:  Deland Pretty, MD  Cardiologist: Fransico Him, MD EPS: None  No chief complaint on file.   History of Present Illness:  Jason Lewis is a 68 y.o. male  with history of CAD status post NSTEMI followed by CABG times 4-10/22/18 with LIMA to the LAD, SVG to the ramus, SVG to diagonal 2, SVG to PDA.  He was placed on Plavix because of NSTEMI.  Normal LVEF 55 to 60% on TEE Intra-Op.  Patient also has hypertension, HLD tolerating lower dose statin.   Patient saw Dr. Radford Pax 09/19/2017 complaining of a fluttering in his heart.  Heart monitor showed normal sinus rhythm with rare PVCs.     Past Medical History:  Diagnosis Date  . CAD in native artery    a.  CABG x 4 utilizing LIMA ot LAD, SVG to Ramus, SVG to Diagonal #2, and SVG to PDA on 05/19/17.  . Chest pain 05/12/2017  . Essential hypertension   . 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 Hyperglycemia    a. A1C 5.7 in 04/2017.  Marland Kitchen Hyperlipidemia   . Medical history non-contributory   . Non-ST elevation (NSTEMI) myocardial infarction [facility])     Past Surgical History:  Procedure Laterality Date  . CARDIAC CATHETERIZATION  05/13/2017   Dr Linard Millers 111  . CORONARY ARTERY BYPASS GRAFT N/A 05/19/2017   Procedure: CORONARY ARTERY BYPASS GRAFTING (CABG) x 4;  Surgeon: Ivin Poot, MD;  Location: [place];  Service: Open Heart Surgery;  Laterality: N/A;  . HERNIA REPAIR    . KNEE SURGERY Left 2005  . LEFT HEART CATH AND CORONARY ANGIOGRAPHY N/A 05/13/2017   Procedure: LEFT HEART CATH AND CORONARY ANGIOGRAPHY;  Surgeon: Belva Crome, MD;  Location: [place] [facility];  Service: Cardiovascular;  Laterality: N/A;  . TEE WITHOUT CARDIOVERSION N/A 05/19/2017   Procedure: TRANSESOPHAGEAL ECHOCARDIOGRAM (TEE);  Surgeon: Prescott Gum, Collier Salina, MD;  Location: [place];  Service: Open Heart Surgery;  Laterality: N/A;  . TONSILLECTOMY    . ULTRASOUND GUIDANCE  FOR VASCULAR ACCESS  05/13/2017   Procedure: Ultrasound Guidance For Vascular Access;  Surgeon: Belva Crome, MD;  Location: [place] [facility];  Service: Cardiovascular;;    Current Medications: No outpatient medications have been marked as taking for the 10/20/18 encounter (Appointment) with Imogene Burn, PA-C.     Allergies:   Patient has no known allergies.   Social History   Socioeconomic History  . Marital status: Married    Spouse name: Not on file  . Number of children: Not on file  . Years of education: Not on file  . Highest education level: Not on file  Occupational History  . Occupation: [employer]  . Financial resource strain: Not on file  . Food insecurity:    Worry: Not on file    Inability: Not on file  . Transportation needs:    Medical: No    Non-medical: No  Tobacco Use  . Smoking status: Never Smoker  . Smokeless tobacco: Never Used  Substance and Sexual Activity  . Alcohol use: Yes    Comment: occ  . Drug use: No  . Sexual activity: Not on file  Lifestyle  . Physical activity:    Days per week: 0 days    Minutes per session: 0 min  . Stress: Only a little  Relationships  . Social connections:    Talks on phone: Not on file    Gets together: Not on file    Attends religious service: Not on file    Active member of club or organization: Not on file    Attends meetings of clubs or organizations: Not on file    Relationship status: Married  Other Topics Concern  . Not on file  Social History Narrative  . Not on file     Family History:  [name] ***family history includes Angina in his father; Coronary artery disease in his brother.   ROS:   Please see the history of present illness.    ROS All other systems reviewed and are negative.   PHYSICAL EXAM:   VS:  There were no vitals taken for this visit.  Physical Exam  GEN: Well nourished, well developed, in no acute distress  HEENT: normal  Neck: no JVD, carotid  bruits, or masses Cardiac:RRR; no murmurs, rubs, or gallops  Respiratory:  clear to auscultation bilaterally, normal work of breathing GI: soft, nontender, nondistended, + BS Ext: without cyanosis, clubbing, or edema, Good distal pulses bilaterally MS: no deformity or atrophy  Skin: warm and dry, no rash Neuro:  Alert and Oriented x 3, Strength and sensation are intact Psych: euthymic mood, full affect  Wt Readings from Last 3 Encounters:  10/12/18 184 lb (83.5 kg)  09/22/18 184 lb 8 oz (83.7 kg)  09/19/17 175 lb 6.4 oz (79.6 kg)      Studies/Labs Reviewed:   EKG:  EKG is*** ordered today.  The ekg ordered today demonstrates ***  Recent Labs: 10/12/2018: ALT 21; BUN 17; Creatinine, Ser 1.01; Hemoglobin 13.3; Platelets 250; Potassium 4.3; Sodium 141; TSH 4.870   Lipid Panel    Component Value Date/Time   CHOL 113 10/12/2018 0735   TRIG 89 10/12/2018 0735   HDL 44 10/12/2018 0735   CHOLHDL 2.6 10/12/2018 0735   CHOLHDL 4.8 05/12/2017 1832   VLDL 25 05/12/2017 1832   LDLCALC 51 10/12/2018 0735    Additional studies/ records that were reviewed today include:  ***    ASSESSMENT:    No diagnosis found.   PLAN:  In order of problems listed above:      Medication Adjustments/Labs and Tests Ordered: Current medicines are reviewed at length with the patient today.  Concerns regarding medicines are outlined above.  Medication changes, Labs and Tests ordered today are listed in the Patient Instructions below. There are no Patient Instructions on file for this visit.   Signed, Ermalinda Barrios, PA-C  10/14/2018 1:39 PM    [facility] [address], [place], [place]  [address] Phone: [phone]; Fax: [phone]

## 2018-10-20 ENCOUNTER — Ambulatory Visit: Payer: PPO | Admitting: Physician Assistant

## 2018-10-23 ENCOUNTER — Other Ambulatory Visit: Payer: Self-pay | Admitting: Physician Assistant

## 2018-11-10 NOTE — Telephone Encounter (Signed)
   TELEPHONE CALL NOTE  This patient has been deemed a candidate for follow-up tele-health visit to limit community exposure during the Covid-19 pandemic. He was previously deemed a L3 to reschedule but we are now trying to get these patients seen by telehealth to stagger the volume and make sure patients are getting adequate care.  The patient did not answer so I LMOM to call back. It looks like Selinda Eon has several openings on 3/28 if you can please schedule him a virtual visit at that time and go over necessary info. Thanks.   [name], PA-C 11/10/2018 4:39 PM

## 2018-11-11 NOTE — Telephone Encounter (Signed)
I called and left patient a message about setting up a Telehealth visit with Dr. Radford Pax.

## 2018-11-18 NOTE — Telephone Encounter (Signed)
Attempted, no voicemail option.

## 2018-12-03 NOTE — Telephone Encounter (Signed)
Left message to call back  

## 2018-12-11 DIAGNOSIS — M25562 Pain in left knee: Secondary | ICD-10-CM | POA: Diagnosis not present

## 2018-12-11 DIAGNOSIS — M2392 Unspecified internal derangement of left knee: Secondary | ICD-10-CM | POA: Diagnosis not present

## 2018-12-23 DIAGNOSIS — M25562 Pain in left knee: Secondary | ICD-10-CM | POA: Diagnosis not present

## 2018-12-31 DIAGNOSIS — S83232A Complex tear of medial meniscus, current injury, left knee, initial encounter: Secondary | ICD-10-CM | POA: Diagnosis not present

## 2018-12-31 DIAGNOSIS — M84352A Stress fracture, left femur, initial encounter for fracture: Secondary | ICD-10-CM | POA: Diagnosis not present

## 2018-12-31 DIAGNOSIS — S83272A Complex tear of lateral meniscus, current injury, left knee, initial encounter: Secondary | ICD-10-CM | POA: Diagnosis not present

## 2018-12-31 DIAGNOSIS — M2392 Unspecified internal derangement of left knee: Secondary | ICD-10-CM | POA: Diagnosis not present

## 2019-01-04 ENCOUNTER — Telehealth: Payer: Self-pay

## 2019-01-04 NOTE — Telephone Encounter (Signed)
Attempted phone call to assess for cardiac symptoms prior to clearance. Left VM to call back. He is s/p CABG in 2018 and had recent low risk NST 3 months ago, 09/2018. If no worrisome cardiac symptoms on pt return call, he can be cleared for knee surgery.

## 2019-01-04 NOTE — Telephone Encounter (Signed)
   Jason Lewis Pre-operative Risk Assessment    Request for surgical clearance:  1. What type of surgery is being performed? Left knee scope, menisectomies, subchondro   2. When is this surgery scheduled? TBD   3. What type of clearance is required (medical clearance vs. Pharmacy clearance to hold med vs. Both)? Medical  4. Are there any medications that need to be held prior to surgery and how long? No   5. Practice name and name of physician performing surgery? Dr. Corene Cornea Rogers/[facility]   6. What is your office phone number [phone]    7.   What is your office fax number [phone]: Jason Lewis  8.   Anesthesia type (None, local, MAC, general) ? Not listed   Jason Lewis 01/04/2019, 10:53 AM  _________________________________________________________________   (provider comments below)

## 2019-01-04 NOTE — Telephone Encounter (Signed)
   Primary Cardiologist: Fransico Him, MD  Chart reviewed as part of pre-operative protocol coverage. Patient was contacted 01/04/2019 in reference to pre-operative risk assessment for pending surgery as outlined below.  Jason Lewis was last seen on 09/22/18 by Ermalinda Barrios, PA-C and had a low risk NST 09/2018.  Since that day, Jason Lewis has done well w/o anginal symptomatology. Denies exertional CP and dyspnea.   Therefore, based on ACC/AHA guidelines, the patient would be at acceptable risk for the planned procedure without further cardiovascular testing.   I will route this recommendation to the requesting party via Epic fax function and remove from pre-op pool.  Please call with questions.  Lyda Jester, PA-C 01/04/2019, 11:32 AM

## 2019-01-09 ENCOUNTER — Other Ambulatory Visit (HOSPITAL_COMMUNITY)
Admission: RE | Admit: 2019-01-09 | Discharge: 2019-01-09 | Disposition: A | Discharge status: Home / Self Care | Payer: PPO | Source: Ambulatory Visit | Attending: Orthopedic Surgery | Admitting: Orthopedic Surgery

## 2019-01-09 DIAGNOSIS — Z1159 Encounter for screening for other viral diseases: Secondary | ICD-10-CM | POA: Insufficient documentation

## 2019-01-11 ENCOUNTER — Encounter (HOSPITAL_BASED_OUTPATIENT_CLINIC_OR_DEPARTMENT_OTHER): Payer: Self-pay | Admitting: Emergency Medicine

## 2019-01-11 ENCOUNTER — Other Ambulatory Visit: Payer: Self-pay

## 2019-01-11 NOTE — Progress Notes (Signed)
SPOKE WITH: patient ARRIVAL TIME: 1045 RIDING HOME WITH: wife govanni plemons [phone] NPO STATUS: npo solids after mn. Clears til 0430. Ensure at 0430. Then total npo AM MEDICATIONS: metoprolol., atorvastatin PRE-OP ORDERS: yes LABS: istat 4  COMMENTS/CONCERNS: n/a ICE,;RN, BSN.

## 2019-01-12 NOTE — Progress Notes (Signed)
SPOKE W/  _     SCREENING SYMPTOMS OF COVID 19:   COUGH--  RUNNY NOSE---   SORE THROAT--- no  NASAL CONGESTION---- no  SNEEZING---- no  SHORTNESS OF BREATH---no  DIFFICULTY BREATHING---no  TEMP >100.0 -----no  UNEXPLAINED BODY ACHES------no  CHILLS -------- no  HEADACHES ---------no  LOSS OF SMELL/ TASTE --------no    HAVE YOU OR ANY FAMILY MEMBER TRAVELLED PAST 14 DAYS OUT OF THE   COUNTY---no STATE----no COUNTRY----no  HAVE YOU OR ANY FAMILY MEMBER BEEN EXPOSED TO ANYONE WITH COVID 19?  no

## 2019-01-13 ENCOUNTER — Encounter (HOSPITAL_BASED_OUTPATIENT_CLINIC_OR_DEPARTMENT_OTHER)
Admission: RE | Disposition: A | Discharge status: Home / Self Care | Payer: Self-pay | Source: Home / Self Care | Attending: Orthopedic Surgery

## 2019-01-13 ENCOUNTER — Ambulatory Visit (HOSPITAL_BASED_OUTPATIENT_CLINIC_OR_DEPARTMENT_OTHER): Payer: PPO | Admitting: Anesthesiology

## 2019-01-13 ENCOUNTER — Other Ambulatory Visit: Payer: Self-pay

## 2019-01-13 ENCOUNTER — Ambulatory Visit (HOSPITAL_COMMUNITY): Payer: PPO

## 2019-01-13 ENCOUNTER — Encounter (HOSPITAL_BASED_OUTPATIENT_CLINIC_OR_DEPARTMENT_OTHER): Payer: Self-pay | Admitting: *Deleted

## 2019-01-13 ENCOUNTER — Ambulatory Visit (HOSPITAL_BASED_OUTPATIENT_CLINIC_OR_DEPARTMENT_OTHER)
Admission: RE | Admit: 2019-01-13 | Discharge: 2019-01-13 | Disposition: A | Discharge status: Home / Self Care | Payer: PPO | Attending: Orthopedic Surgery | Admitting: Orthopedic Surgery

## 2019-01-13 ENCOUNTER — Other Ambulatory Visit (HOSPITAL_COMMUNITY)
Admission: RE | Admit: 2019-01-13 | Discharge: 2019-01-13 | Disposition: A | Discharge status: Home / Self Care | Payer: PPO | Source: Ambulatory Visit | Attending: Orthopedic Surgery | Admitting: Orthopedic Surgery

## 2019-01-13 DIAGNOSIS — Z1159 Encounter for screening for other viral diseases: Secondary | ICD-10-CM | POA: Insufficient documentation

## 2019-01-13 DIAGNOSIS — M94262 Chondromalacia, left knee: Secondary | ICD-10-CM | POA: Insufficient documentation

## 2019-01-13 DIAGNOSIS — I252 Old myocardial infarction: Secondary | ICD-10-CM | POA: Insufficient documentation

## 2019-01-13 DIAGNOSIS — I2510 Atherosclerotic heart disease of native coronary artery without angina pectoris: Secondary | ICD-10-CM | POA: Insufficient documentation

## 2019-01-13 DIAGNOSIS — Z9889 Other specified postprocedural states: Secondary | ICD-10-CM | POA: Diagnosis not present

## 2019-01-13 DIAGNOSIS — S83282A Other tear of lateral meniscus, current injury, left knee, initial encounter: Secondary | ICD-10-CM | POA: Insufficient documentation

## 2019-01-13 DIAGNOSIS — S83242A Other tear of medial meniscus, current injury, left knee, initial encounter: Secondary | ICD-10-CM | POA: Insufficient documentation

## 2019-01-13 DIAGNOSIS — M84362A Stress fracture, left tibia, initial encounter for fracture: Secondary | ICD-10-CM | POA: Diagnosis not present

## 2019-01-13 DIAGNOSIS — Z951 Presence of aortocoronary bypass graft: Secondary | ICD-10-CM | POA: Insufficient documentation

## 2019-01-13 DIAGNOSIS — Z419 Encounter for procedure for purposes other than remedying health state, unspecified: Secondary | ICD-10-CM

## 2019-01-13 DIAGNOSIS — Z79899 Other long term (current) drug therapy: Secondary | ICD-10-CM | POA: Diagnosis not present

## 2019-01-13 DIAGNOSIS — E785 Hyperlipidemia, unspecified: Secondary | ICD-10-CM | POA: Diagnosis not present

## 2019-01-13 DIAGNOSIS — X58XXXA Exposure to other specified factors, initial encounter: Secondary | ICD-10-CM | POA: Diagnosis not present

## 2019-01-13 DIAGNOSIS — Z7982 Long term (current) use of aspirin: Secondary | ICD-10-CM | POA: Insufficient documentation

## 2019-01-13 DIAGNOSIS — M84352A Stress fracture, left femur, initial encounter for fracture: Secondary | ICD-10-CM | POA: Insufficient documentation

## 2019-01-13 DIAGNOSIS — I10 Essential (primary) hypertension: Secondary | ICD-10-CM | POA: Diagnosis not present

## 2019-01-13 DIAGNOSIS — G8918 Other acute postprocedural pain: Secondary | ICD-10-CM | POA: Diagnosis not present

## 2019-01-13 HISTORY — PX: KNEE ARTHROSCOPY WITH SUBCHONDROPLASTY: SHX6732

## 2019-01-13 LAB — POCT I-STAT, CHEM 8
BUN: 15 mg/dL (ref 8–23)
Calcium, Ion: 1.26 mmol/L (ref 1.15–1.40)
Chloride: 103 mmol/L (ref 98–111)
Creatinine, Ser: 1 mg/dL (ref 0.61–1.24)
Glucose, Bld: 99 mg/dL (ref 70–99)
HCT: 40 % (ref 39.0–52.0)
Hemoglobin: 13.6 g/dL (ref 13.0–17.0)
Potassium: 4.2 mmol/L (ref 3.5–5.1)
Sodium: 139 mmol/L (ref 135–145)
TCO2: 29 mmol/L (ref 22–32)

## 2019-01-13 LAB — SARS CORONAVIRUS 2 BY RT PCR (HOSPITAL ORDER, PERFORMED IN ~~LOC~~ HOSPITAL LAB): SARS Coronavirus 2: NEGATIVE

## 2019-01-13 SURGERY — ARTHROSCOPY, KNEE, WITH SUBCHONDROPLASTY
Anesthesia: General | Site: Knee | Laterality: Left

## 2019-01-13 MED ORDER — MIDAZOLAM HCL 2 MG/2ML IJ SOLN
INTRAMUSCULAR | Status: DC | PRN
  Administered 2019-01-13: 1 mg via INTRAVENOUS

## 2019-01-13 MED ORDER — CHLORHEXIDINE GLUCONATE 4 % EX LIQD
60.0000 mL | Freq: Once | CUTANEOUS | Status: DC
  Filled 2019-01-13: qty 118

## 2019-01-13 MED ORDER — ROPIVACAINE HCL 5 MG/ML IJ SOLN
INTRAMUSCULAR | Status: DC | PRN
  Administered 2019-01-13: 30 mL via PERINEURAL

## 2019-01-13 MED ORDER — SODIUM CHLORIDE 0.9 % IR SOLN
Status: DC | PRN
  Administered 2019-01-13: 6000 mL

## 2019-01-13 MED ORDER — MIDAZOLAM HCL 2 MG/2ML IJ SOLN
INTRAMUSCULAR | Status: AC
  Filled 2019-01-13: qty 2

## 2019-01-13 MED ORDER — FENTANYL CITRATE (PF) 100 MCG/2ML IJ SOLN
INTRAMUSCULAR | Status: DC | PRN
  Administered 2019-01-13: 50 ug via INTRAVENOUS
  Administered 2019-01-13: 25 ug via INTRAVENOUS

## 2019-01-13 MED ORDER — METOPROLOL TARTRATE 25 MG PO TABS
25.0000 mg | ORAL_TABLET | Freq: Once | ORAL | Status: AC
  Administered 2019-01-13: 25 mg via ORAL
  Filled 2019-01-13 (×2): qty 1

## 2019-01-13 MED ORDER — EPHEDRINE SULFATE-NACL 50-0.9 MG/10ML-% IV SOSY
PREFILLED_SYRINGE | INTRAVENOUS | Status: DC | PRN
  Administered 2019-01-13 (×2): 10 mg via INTRAVENOUS

## 2019-01-13 MED ORDER — CLONIDINE HCL (ANALGESIA) 100 MCG/ML EP SOLN
EPIDURAL | Status: DC | PRN
  Administered 2019-01-13: 100 ug

## 2019-01-13 MED ORDER — PROPOFOL 10 MG/ML IV BOLUS
INTRAVENOUS | Status: DC | PRN
  Administered 2019-01-13: 120 mg via INTRAVENOUS

## 2019-01-13 MED ORDER — LACTATED RINGERS IV SOLN
INTRAVENOUS | Status: DC
  Administered 2019-01-13 (×2): via INTRAVENOUS
  Filled 2019-01-13: qty 1000

## 2019-01-13 MED ORDER — DEXAMETHASONE SODIUM PHOSPHATE 10 MG/ML IJ SOLN
INTRAMUSCULAR | Status: DC | PRN
  Administered 2019-01-13: 10 mg via INTRAVENOUS

## 2019-01-13 MED ORDER — LIDOCAINE 2% (20 MG/ML) 5 ML SYRINGE
INTRAMUSCULAR | Status: DC | PRN
  Administered 2019-01-13: 60 mg via INTRAVENOUS

## 2019-01-13 MED ORDER — ONDANSETRON HCL 4 MG/2ML IJ SOLN
INTRAMUSCULAR | Status: DC | PRN
  Administered 2019-01-13: 4 mg via INTRAVENOUS

## 2019-01-13 MED ORDER — LIDOCAINE 2% (20 MG/ML) 5 ML SYRINGE
INTRAMUSCULAR | Status: AC
  Filled 2019-01-13: qty 5

## 2019-01-13 MED ORDER — ONDANSETRON HCL 4 MG/2ML IJ SOLN
INTRAMUSCULAR | Status: AC
  Filled 2019-01-13: qty 2

## 2019-01-13 MED ORDER — FENTANYL CITRATE (PF) 100 MCG/2ML IJ SOLN
INTRAMUSCULAR | Status: AC
  Filled 2019-01-13: qty 2

## 2019-01-13 MED ORDER — DEXAMETHASONE SODIUM PHOSPHATE 10 MG/ML IJ SOLN
INTRAMUSCULAR | Status: AC
  Filled 2019-01-13: qty 1

## 2019-01-13 MED ORDER — EPHEDRINE 5 MG/ML INJ
INTRAVENOUS | Status: AC
  Filled 2019-01-13: qty 10

## 2019-01-13 MED ORDER — CEFAZOLIN SODIUM-DEXTROSE 2-4 GM/100ML-% IV SOLN
2.0000 g | INTRAVENOUS | Status: AC
  Administered 2019-01-13: 13:00:00 2 g via INTRAVENOUS
  Filled 2019-01-13: qty 100

## 2019-01-13 MED ORDER — FENTANYL CITRATE (PF) 100 MCG/2ML IJ SOLN
100.0000 ug | Freq: Once | INTRAMUSCULAR | Status: AC
  Administered 2019-01-13: 100 ug via INTRAVENOUS
  Filled 2019-01-13: qty 2

## 2019-01-13 MED ORDER — OXYCODONE HCL 5 MG PO TABS: 5.0000 mg | ORAL_TABLET | Freq: Four times a day (QID) | ORAL | 0 refills | Status: DC | PRN

## 2019-01-13 MED ORDER — ONDANSETRON 4 MG PO TBDP: 4.0000 mg | ORAL_TABLET | Freq: Three times a day (TID) | ORAL | 0 refills | Status: DC | PRN

## 2019-01-13 MED ORDER — CEFAZOLIN SODIUM-DEXTROSE 2-4 GM/100ML-% IV SOLN
INTRAVENOUS | Status: AC
  Filled 2019-01-13: qty 100

## 2019-01-13 SURGICAL SUPPLY — 41 items
ABLATOR ASPIRATE 50D MULTI-PRT (SURGICAL WAND) IMPLANT
BANDAGE ELASTIC 6 VELCRO ST LF (GAUZE/BANDAGES/DRESSINGS) ×3 IMPLANT
BANDAGE ESMARK 6X9 LF (GAUZE/BANDAGES/DRESSINGS) IMPLANT
BLADE SHAVER TORPEDO 4X13 (MISCELLANEOUS) ×3 IMPLANT
BLADE SURG 11 STRL SS (BLADE) ×3 IMPLANT
BNDG ESMARK 6X9 LF (GAUZE/BANDAGES/DRESSINGS)
CANNULA ACCUPORT 11G 120M END (CANNULA) ×3 IMPLANT
CANNULA ACCUPORT 11G X 120 (CANNULA) ×3 IMPLANT
COVER WAND RF STERILE (DRAPES) ×3 IMPLANT
CUFF TOURNIQUET SINGLE 34IN LL (TOURNIQUET CUFF) ×3 IMPLANT
DRAPE ARTHROSCOPY W/POUCH 114 (DRAPES) ×3 IMPLANT
DRAPE C-ARM 42X120 X-RAY (DRAPES) ×3 IMPLANT
DRAPE U-SHAPE 47X51 STRL (DRAPES) ×3 IMPLANT
DRSG PAD ABDOMINAL 8X10 ST (GAUZE/BANDAGES/DRESSINGS) ×3 IMPLANT
DURAPREP 26ML APPLICATOR (WOUND CARE) ×3 IMPLANT
GAUZE SPONGE 4X4 12PLY STRL (GAUZE/BANDAGES/DRESSINGS) ×3 IMPLANT
GAUZE XEROFORM 1X8 LF (GAUZE/BANDAGES/DRESSINGS) ×3 IMPLANT
GLOVE BIO SURGEON STRL SZ7.5 (GLOVE) ×3 IMPLANT
GLOVE BIOGEL PI IND STRL 8 (GLOVE) ×1 IMPLANT
GLOVE BIOGEL PI INDICATOR 8 (GLOVE) ×2
GOWN STRL REUS W/TWL XL LVL3 (GOWN DISPOSABLE) IMPLANT
IMMOBILIZER KNEE 22 UNIV (SOFTGOODS) ×3 IMPLANT
IV NS IRRIG 3000ML ARTHROMATIC (IV SOLUTION) ×6 IMPLANT
KIT ACCUFILL 5CC (Knees) ×1 IMPLANT
KIT KNEE SCP 414.502 (Knees) ×3 IMPLANT
KIT TURNOVER CYSTO (KITS) ×3 IMPLANT
KNEE WRAP E Z 3 GEL PACK (MISCELLANEOUS) IMPLANT
MANIFOLD NEPTUNE II (INSTRUMENTS) ×3 IMPLANT
PACK ARTHROSCOPY DSU (CUSTOM PROCEDURE TRAY) ×3 IMPLANT
PACK BASIN DAY SURGERY FS (CUSTOM PROCEDURE TRAY) ×3 IMPLANT
PAD ABD 8X10 STRL (GAUZE/BANDAGES/DRESSINGS) ×3 IMPLANT
PAD ARMBOARD 7.5X6 YLW CONV (MISCELLANEOUS) IMPLANT
PORT APPOLLO RF 90DEGREE MULTI (SURGICAL WAND) IMPLANT
SUT ETHILON 4 0 PS 2 18 (SUTURE) ×3 IMPLANT
SYR CONTROL 10ML LL (SYRINGE) ×3 IMPLANT
TOWEL OR 17X26 10 PK STRL BLUE (TOWEL DISPOSABLE) ×3 IMPLANT
TUBE CONNECTING 12'X1/4 (SUCTIONS) ×2
TUBE CONNECTING 12X1/4 (SUCTIONS) ×4 IMPLANT
TUBING ARTHROSCOPY IRRIG 16FT (MISCELLANEOUS) ×3 IMPLANT
WATER STERILE IRR 500ML POUR (IV SOLUTION) ×3 IMPLANT
WRAP KNEE MAXI GEL POST OP (GAUZE/BANDAGES/DRESSINGS) ×3 IMPLANT

## 2019-01-13 NOTE — Anesthesia Procedure Notes (Signed)
Procedure Name: LMA Insertion Date/Time: 01/13/2019 12:53 PM Performed by: Suan Halter, CRNA Pre-anesthesia Checklist: Patient identified, Emergency Drugs available, Suction available and Patient being monitored Patient Re-evaluated:Patient Re-evaluated prior to induction Oxygen Delivery Method: Circle system utilized Preoxygenation: Pre-oxygenation with 100% oxygen Induction Type: IV induction Ventilation: Mask ventilation without difficulty LMA: LMA inserted LMA Size: 4.0 Number of attempts: 1 Airway Equipment and Method: Bite block Placement Confirmation: positive ETCO2 Tube secured with: Tape Dental Injury: Teeth and Oropharynx as per pre-operative assessment

## 2019-01-13 NOTE — Anesthesia Postprocedure Evaluation (Signed)
Anesthesia Post Note  Patient: Jason Lewis  Procedure(s) Performed: Left knee arthroscopic partial medial and lateral meniscectomies with medial tibial and medial femoral condyle subchondroplasty (Left Knee)     Patient location during evaluation: PACU Anesthesia Type: General and Regional Level of consciousness: awake and alert Pain management: pain level controlled Vital Signs Assessment: post-procedure vital signs reviewed and stable Respiratory status: spontaneous breathing, nonlabored ventilation, respiratory function stable and patient connected to nasal cannula oxygen Cardiovascular status: blood pressure returned to baseline and stable Postop Assessment: no apparent nausea or vomiting Anesthetic complications: no    Last Vitals:  Vitals:   01/13/19 1430 01/13/19 1435  BP: 125/68   Pulse: (!) 47 (!) 49  Resp: 12 17  Temp:    SpO2: 95% 91%    Last Pain:  Vitals:   01/13/19 1430  TempSrc:   PainSc: 0-No pain                 Barnet Glasgow

## 2019-01-13 NOTE — Transfer of Care (Signed)
Immediate Anesthesia Transfer of Care Note  Patient: Jason Lewis  Procedure(s) Performed: Procedure(s) (LRB): Left knee arthroscopic partial medial and lateral meniscectomies with medial tibial and medial femoral condyle subchondroplasty (Left)  Patient Location: PACU  Anesthesia Type: General  Level of Consciousness: awake, oriented, sedated and patient cooperative  Airway & Oxygen Therapy: Patient Spontanous Breathing and Patient connected to face mask oxygen  Post-op Assessment: Report given to PACU RN and Post -op Vital signs reviewed and stable  Post vital signs: Reviewed and stable  Complications: No apparent anesthesia complications Last Vitals:  Vitals Value Taken Time  BP 135/84 01/13/19 1356  Temp    Pulse 50 01/13/19 1359  Resp 11 01/13/19 1359  SpO2 97 % 01/13/19 1359  Vitals shown include unvalidated device data.  Last Pain:  Vitals:   01/13/19 1059  TempSrc: Oral

## 2019-01-13 NOTE — Brief Op Note (Signed)
01/13/2019  2:19 PM  PATIENT:  Jason Lewis  68 y.o. male  PRE-OPERATIVE DIAGNOSIS:  Left knee medial and lateral meniscus tears, medial femoral and tibial condyle stress fracture  POST-OPERATIVE DIAGNOSIS:  Left knee medial and lateral meniscus tears, medial femoral and tibial condyle stress fracture  PROCEDURE:  Procedure(s): Left knee arthroscopic partial medial and lateral meniscectomies with medial tibial and medial femoral condyle subchondroplasty (Left)  SURGEON:  Surgeon(s) and Role:    * [name], MD - Primary  PHYSICIAN ASSISTANT:   ASSISTANTS: none   ANESTHESIA:   regional and general  EBL:  5 mL   BLOOD ADMINISTERED:none  DRAINS: none   LOCAL MEDICATIONS USED:  NONE  SPECIMEN:  No Specimen  DISPOSITION OF SPECIMEN:  N/A  COUNTS:  YES  TOURNIQUET:  * Missing tourniquet times found for documented tourniquets in log: [id number] *  DICTATION: .Note written in EPIC  PLAN OF CARE: Discharge to home after PACU  PATIENT DISPOSITION:  PACU - hemodynamically stable.   Delay start of Pharmacological VTE agent (>24hrs) due to surgical blood loss or risk of bleeding: not applicable

## 2019-01-13 NOTE — Discharge Instructions (Signed)
- maintain post op bandages for 3 days.   May remove at that time and begin to shower.  But do not submerge under water until after follow up appointment  - take a 81 mg aspirin twice daily x 6 weeks for prevention of blood clots  - apply ice to the left knee for 20-30 minutes at a time each hour that you are awake for pain and swelling control  - for pain use tylenol and or advil for mild to moderate pain and oxycodone for breakthrough pain  - ok to put full weight on the left leg as tolerated  - return in 2 weeks to see Dr. Stann Mainland   .ARTHROSCOPIC KNEE SURGERY HOME CARE INSTRUCTIONS   PAIN You will be expected to have a moderate amount of pain in the affected knee for approximately two weeks.  However, the first two to four days will be the most severe in terms of the pain you will experience.  Prescriptions have been provided for you to take as needed for the pain.  The pain can be markedly reduced by using the ice/compressive bandage given.  Exchange the ice packs whenever they thaw.  During the night, keep the bandage on because it will still provide some compression for the swelling.  Also, keep the leg elevated on pillows above your heart, and this will help alleviate the pain and swelling.  MEDICATION Prescriptions have been provided to take as needed for pain. To prevent blood clots, take Aspirin 81mg  daily with a meal if not on a blood thinner and if no history of stomach ulcers.  ACTIVITY It is preferred that you stay on bedrest for approximately 24 hours.  However, you may go to the bathroom with help.  After this, you can start to be up and about progressively more.  Remember that the swelling may still increase after three to four days if you are up and doing too much.  You may put as much weight on the affected leg as pain will allow.  Use your crutches for comfort and safety.  However, as soon as you are able, you may discard the crutches and go without them.   DRESSING Keep  the current dressing as dry as possible.  Three days after your surgery, you may remove the ice/compressive wrap, and surgical dressing.  You may now take a shower, but do not scrub the sounds directly with soap.  Let water rinse over these and gently wipe with your hand.  Reapply band-aids over the puncture wounds and more gauze if needed.  A slight amount of thin drainage can be normal at this time, and do not let it frighten you.  Reapply the ice/compressive wrap.  You may now repeat this every day each time you shower.  SYMPTOMS TO REPORT TO YOUR DOCTOR  -Extreme pain.  -Extreme swelling.  -Temperature above 101 degrees that does not come down with acetaminophen     (Tylenol).  -Any changes in the feeling, color or movement of your toes.  -Extreme redness, heat, swelling or drainage at your incision           Regional Anesthesia Blocks  1. Numbness or the inability to move the "blocked" extremity may last from 3-48 hours after placement. The length of time depends on the medication injected and your individual response to the medication. If the numbness is not going away after 48 hours, call your surgeon.  2. The extremity that is blocked will need to be  protected until the numbness is gone and the  Strength has returned. Because you cannot feel it, you will need to take extra care to avoid injury. Because it may be weak, you may have difficulty moving it or using it. You may not know what position it is in without looking at it while the block is in effect.  3. For blocks in the legs and feet, returning to weight bearing and walking needs to be done carefully. You will need to wait until the numbness is entirely gone and the strength has returned. You should be able to move your leg and foot normally before you try and bear weight or walk. You will need someone to be with you when you first try to ensure you do not fall and possibly risk injury.  4. Bruising and tenderness at the needle  site are common side effects and will resolve in a few days.  5. Persistent numbness or new problems with movement should be communicated to the surgeon or the [facility] [phone] [place] [phone]).   Post Anesthesia Home Care Instructions  Activity: Get plenty of rest for the remainder of the day. A responsible adult should stay with you for 24 hours following the procedure.  For the next 24 hours, DO NOT: -Drive a car -Paediatric nurse -Drink alcoholic beverages -Take any medication unless instructed by your physician -Make any legal decisions or sign important papers.  Meals: Start with liquid foods such as gelatin or soup. Progress to regular foods as tolerated. Avoid greasy, spicy, heavy foods. If nausea and/or vomiting occur, drink only clear liquids until the nausea and/or vomiting subsides. Call your physician if vomiting continues.  Special Instructions/Symptoms: Your throat may feel dry or sore from the anesthesia or the breathing tube placed in your throat during surgery. If this causes discomfort, gargle with warm salt water. The discomfort should disappear within 24 hours.  If you had a scopolamine patch placed behind your ear for the management of post- operative nausea and/or vomiting:  1. The medication in the patch is effective for 72 hours, after which it should be removed.  Wrap patch in a tissue and discard in the trash. Wash hands thoroughly with soap and water. 2. You may remove the patch earlier than 72 hours if you experience unpleasant side effects which may include dry mouth, dizziness or visual disturbances. 3. Avoid touching the patch. Wash your hands with soap and water after contact with the patch.

## 2019-01-13 NOTE — H&P (Addendum)
ORTHOPAEDIC H and P  REQUESTING PHYSICIAN: [name], MD  PCP:  Deland Pretty, MD  Chief Complaint: Left knee pain  HPI: Jason Lewis is a 68 y.o. male who complains of increasing left knee painite conservative management.  He also describes Mechanical symptoms and swelling of the knee.  He has had multiple injections as well as oral medications and physical therapy.  He presented today for surgical treatment.  Past Medical History:  Diagnosis Date  . CAD in native artery    a.  CABG x 4 utilizing LIMA ot LAD, SVG to Ramus, SVG to Diagonal #2, and SVG to PDA on 05/19/17.  . Chest pain 05/12/2017  . Essential hypertension   . 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 Hyperglycemia    a. A1C 5.7 in 04/2017.  Marland Kitchen Hyperlipidemia   . Medical history non-contributory   . Non-ST elevation (NSTEMI) myocardial infarction [facility])    Past Surgical History:  Procedure Laterality Date  . CARDIAC CATHETERIZATION  05/13/2017   Dr Linard Millers 111  . CORONARY ARTERY BYPASS GRAFT N/A 05/19/2017   Procedure: CORONARY ARTERY BYPASS GRAFTING (CABG) x 4;  Surgeon: Ivin Poot, MD;  Location: [place];  Service: Open Heart Surgery;  Laterality: N/A;  . HERNIA REPAIR    . KNEE SURGERY Left 2005  . LEFT HEART CATH AND CORONARY ANGIOGRAPHY N/A 05/13/2017   Procedure: LEFT HEART CATH AND CORONARY ANGIOGRAPHY;  Surgeon: Belva Crome, MD;  Location: [place] [facility];  Service: Cardiovascular;  Laterality: N/A;  . TEE WITHOUT CARDIOVERSION N/A 05/19/2017   Procedure: TRANSESOPHAGEAL ECHOCARDIOGRAM (TEE);  Surgeon: Prescott Gum, Collier Salina, MD;  Location: [place];  Service: Open Heart Surgery;  Laterality: N/A;  . TONSILLECTOMY    . ULTRASOUND GUIDANCE FOR VASCULAR ACCESS  05/13/2017   Procedure: Ultrasound Guidance For Vascular Access;  Surgeon: Belva Crome, MD;  Location: [place] [facility];  Service: Cardiovascular;;   Social History   Socioeconomic History  . Marital status: Married    Spouse name: Not on  file  . Number of children: Not on file  . Years of education: Not on file  . Highest education level: Not on file  Occupational History  . Occupation: [employer]  . Financial resource strain: Not on file  . Food insecurity    Worry: Not on file    Inability: Not on file  . Transportation needs    Medical: No    Non-medical: No  Tobacco Use  . Smoking status: Never Smoker  . Smokeless tobacco: Never Used  Substance and Sexual Activity  . Alcohol use: Yes    Alcohol/week: 7.0 standard drinks    Types: 7 Glasses of wine per week  . Drug use: No  . Sexual activity: Not on file  Lifestyle  . Physical activity    Days per week: 0 days    Minutes per session: 0 min  . Stress: Only a little  Relationships  . Social Herbalist on phone: Not on file    Gets together: Not on file    Attends religious service: Not on file    Active member of club or organization: Not on file    Attends meetings of clubs or organizations: Not on file    Relationship status: Married  Other Topics Concern  . Not on file  Social History Narrative  . Not on file   Family History  Problem Relation Age of  Onset  . Angina Father   . Coronary artery disease Brother        s/p CABG   No Known Allergies Prior to Admission medications   Medication Sig Start Date End Date Taking? Authorizing Provider  aspirin EC 81 MG EC tablet Take 1 tablet (81 mg total) by mouth daily. 05/24/17  Yes Gold, Patrick Jupiter E, PA-C  atorvastatin (LIPITOR) 40 MG tablet Take 1 tablet by mouth daily 10/23/18  Yes Imogene Burn, PA-C  Metoprolol Succinate 25 MG CS24 Take 25 mg by mouth daily.   Yes [provider]  venlafaxine XR (EFFEXOR-XR) 75 MG 24 hr capsule Take 75 mg by mouth daily.  06/16/17   [provider]   No results found.  Positive ROS: All other systems have been reviewed and were otherwise negative with the exception of those mentioned in the HPI and as above.  Physical  Exam: General: Alert, no acute distress Cardiovascular: No pedal edema Respiratory: No cyanosis, no use of accessory musculature GI: No organomegaly, abdomen is soft and non-tender Skin: No lesions in the area of chief complaint Neurologic: Sensation intact distally Psychiatric: Patient is competent for consent with normal mood and affect Lymphatic: No axillary or cervical lymphadenopathy  MUSCULOSKELETAL:  Left knee no open wounds or overlying skin changes.  Neuro intact.  Assessment: Left knee medial meniscus tear Left knee lateral meniscus tear Left medial femoral condyle stress fracture Left medial tibial plateau stress fracture  Plan: - Plan today is for arthroscopic intervention to include partial medial and lateral meniscectomy.  We also discussed subchondroplasty  of the medial femoral condyle and medial tibial plateau.  - We have previously discussed this plan in the office and all questions were solicited and answered to his satisfaction.  He has provided informed consent to proceed after consideration of all the risks, benefits, and indications.   - We will plan for discharge home from PACU postoperatively.  With crutches until block wears off.    [name], MD Cell [phone]    01/13/2019 11:50 AM

## 2019-01-13 NOTE — Anesthesia Preprocedure Evaluation (Addendum)
Anesthesia Evaluation  Patient identified by MRN, date of birth, ID band Patient awake    Reviewed: Allergy & Precautions, NPO status , Patient's Chart, lab work & pertinent test results  Airway Mallampati: II  TM Distance: >3 FB Neck ROM: Full    Dental no notable dental hx. (+) Teeth Intact   Pulmonary neg pulmonary ROS,    Pulmonary exam normal breath sounds clear to auscultation       Cardiovascular hypertension, Pt. on medications and Pt. on home beta blockers + CAD  Normal cardiovascular exam Rhythm:Regular Rate:Normal     Neuro/Psych negative neurological ROS  negative psych ROS   GI/Hepatic negative GI ROS,   Endo/Other  negative endocrine ROS  Renal/GU negative Renal ROS     Musculoskeletal   Abdominal   Peds  Hematology   Anesthesia Other Findings   Reproductive/Obstetrics                          Anesthesia Physical Anesthesia Plan  ASA: II  Anesthesia Plan: General   Post-op Pain Management:  Regional for Post-op pain   Induction:   PONV Risk Score and Plan:   Airway Management Planned: LMA  Additional Equipment:   Intra-op Plan:   Post-operative Plan:   Informed Consent:     Dental advisory given  Plan Discussed with:   Anesthesia Plan Comments:         Anesthesia Quick Evaluation

## 2019-01-13 NOTE — Op Note (Signed)
Surgeon(s): [name], MD   ANESTHESIA:  general, and regional   FLUIDS: Per anesthesia record.    ESTIMATED BLOOD LOSS: minimal     PREOPERATIVE DIAGNOSES:  1. Left knee medial meniscus tear 2. Left knee lateral meniscus tear 3.  Left medial femoral condyle insufficiency fracture 4.  Left medial tibial plateau insufficiency fracture 5.   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 Left knee arthroscopically aided treatment of medial femoral condyle insufficiency fracture with percutaneous internal fixation (subchondroplasty) (36644) 2.  Left knee  arthroscopically aided treatment of medial tibial plateau insufficiency fracture with percutaneous internal fixation (subchondroplasty) 2. Left knee arthroscopy with arthroscopic partial medial and lateral meniscectomies 3.  Chondroplasty, Left medial femoral condyle   Implant: Flowable calcium phosphate, 5 mL. Zimmer   DESCRIPTION OF PROCEDURE: The patient has a Left knee medial and lateral menisci tears. They have had pain that has been refractory to conservative management. Their preoperative MRI demonstrated subchondral bone marrow edema and insufficiency fractures of the medial femoral and tibial condyles as well as the menisci tears. Plans are to proceed with partial medial and lateral meniscectomy, internal fixation of subchondral insufficiency fractures with flowable calcium phosphate, and diagnostic arthroscopy with debridement as indicated. Full discussion held regarding risks benefits alternatives and complications related surgical intervention. Conservative care options reviewed. All questions answered.   The patient was identified in the preoperative holding area and the operative extremity was marked. The patient was brought to the operating room and transferred to operating table in a supine position. Satisfactory general anesthesia  was induced by anesthesiology.     Standard anterolateral, anteromedial arthroscopy portals were obtained. The anteromedial portal was obtained with a spinal needle for localization under direct visualization with subsequent diagnostic findings.    Anteromedial and anterolateral chambers: moderate synovitis. The synovitis was debrided with a 4.5 mm full radius shaver through both the anteromedial and lateral portals.    Suprapatellar pouch and gutters: moderate synovitis or debris. Patella chondral surface: Grade 1 Trochlear chondral surface: Grade 3 Patellofemoral tracking: level Medial meniscus: posterior horn and mid body complex degenerative horizontal tearing.  Medial femoral condyle flexion bearing surface: Grade 3 Medial femoral condyle extension bearing surface: Grade 4 Medial tibial plateau: Grade 4 Anterior cruciate ligament:stable Posterior cruciate ligament:stable Lateral meniscus: posterior root teraring horizontal  Lateral femoral condyle flexion bearing surface: Grade 1 Lateral femoral condyle extension bearing surface: Grade 0 Lateral tibial plateau: Grade 1   Medial and lateral menisci tears were debrided using biters and motorized shaver alternating until a stable remnant was left. Upon completion the probe was used to evaluate and assess the remaining meniscus which was gleaned to be stable.   Chondroplasty was achieved on the medial femoral condyle using a motorized shaver to debride the grade 3 and 4 unstable cartilage. Completion of the chondroplasty left  smooth stable surfaces.     Next we turned our attention to the internal fixation of the medial tibial and femora condyles. Arthroscopically we evaluated tibial and femoral condyles noted there was no loose cartilage or debris surrounding the lesion and the fracture did not propagate to the joint surface. Using preoperative MRI we targeted the delivery device to just under the subchondral density and in the medial  femoral and medial tibial condyles. This was achieved with intraoperative fluoroscopy. Once accurate placement was noted on 2 views and confirmed we delivered 4 mL of flowable calcium  phosphate into the medial femoral condyle and 1 cc into the medial tibial plateau. We left the cannulas in place for approximately 8 minutes while the implant hardened. We removed the cannulas and again took 2 views of fluoroscopic pictures to confirm there was no extravasation outside of the bone. There was none noted.   After completion of synovectomy, diagnostic exam, and debridements as described, all compartments were checked and no residual debris remained. Hemostasis was achieved with the cautery wand. The portals were approximated with nylon suture. All excess fluid was expressed from the joint.  Xeroform sterile gauze dressings were applied followed by Ace bandage and ice pack.    There were no immediate competitions and all counts were correct.   DISPOSITION: The patient was awakened from general anesthetic, extubated, taken to the recovery room in medically stable condition, no apparent complications. The patient may be weightbearing as tolerated to the operative lower extremity with crutches.  Range of motion of right knee as tolerated.  They will use bid asa for DVT ppx, and return in 2 weeks for suture removal.   [name]

## 2019-01-13 NOTE — Progress Notes (Signed)
AssistedDr. Houser with left, ultrasound guided, adductor canal block. Side rails up, monitors on throughout procedure. See vital signs in flow sheet. Tolerated Procedure well.  

## 2019-01-13 NOTE — Anesthesia Procedure Notes (Signed)
Anesthesia Regional Block: Adductor canal block   Pre-Anesthetic Checklist: ,, timeout performed, Correct Patient, Correct Site, Correct Laterality, Correct Procedure, Correct Position, site marked, Risks and benefits discussed,  Surgical consent,  Pre-op evaluation,  At surgeon's request and post-op pain management  Laterality: Lower and Left  Prep: chloraprep       Needles:  Injection technique: Single-shot  Needle Type: Echogenic Needle     Needle Length: 9cm  Needle Gauge: 22     Additional Needles:   Procedures:,,,, ultrasound used (permanent image in chart),,,,  Narrative:  Start time: 01/13/2019 12:27 PM End time: 01/13/2019 12:35 PM Injection made incrementally with aspirations every 5 mL.  Performed by: Personally  Anesthesiologist: Barnet Glasgow, MD  Additional Notes: Block assessed prior to surgery. Pt tolerated procedure well.

## 2019-01-18 ENCOUNTER — Encounter (HOSPITAL_BASED_OUTPATIENT_CLINIC_OR_DEPARTMENT_OTHER): Payer: Self-pay | Admitting: Orthopedic Surgery

## 2019-02-12 ENCOUNTER — Other Ambulatory Visit: Payer: Self-pay | Admitting: Physician Assistant

## 2019-02-22 DIAGNOSIS — Z4789 Encounter for other orthopedic aftercare: Secondary | ICD-10-CM | POA: Diagnosis not present

## 2019-02-22 DIAGNOSIS — M25562 Pain in left knee: Secondary | ICD-10-CM | POA: Diagnosis not present

## 2019-02-26 ENCOUNTER — Other Ambulatory Visit: Payer: Self-pay

## 2019-04-15 DIAGNOSIS — S83241D Other tear of medial meniscus, current injury, right knee, subsequent encounter: Secondary | ICD-10-CM | POA: Diagnosis not present

## 2019-04-15 DIAGNOSIS — S83281D Other tear of lateral meniscus, current injury, right knee, subsequent encounter: Secondary | ICD-10-CM | POA: Diagnosis not present

## 2019-05-11 ENCOUNTER — Encounter: Payer: Self-pay | Admitting: Cardiology

## 2019-06-14 DIAGNOSIS — S83241D Other tear of medial meniscus, current injury, right knee, subsequent encounter: Secondary | ICD-10-CM | POA: Diagnosis not present

## 2019-06-14 DIAGNOSIS — S83281D Other tear of lateral meniscus, current injury, right knee, subsequent encounter: Secondary | ICD-10-CM | POA: Diagnosis not present

## 2019-07-13 NOTE — Telephone Encounter (Signed)
error 

## 2019-07-16 DIAGNOSIS — M1711 Unilateral primary osteoarthritis, right knee: Secondary | ICD-10-CM | POA: Diagnosis not present

## 2019-07-16 DIAGNOSIS — M170 Bilateral primary osteoarthritis of knee: Secondary | ICD-10-CM | POA: Diagnosis not present

## 2019-07-16 DIAGNOSIS — M1712 Unilateral primary osteoarthritis, left knee: Secondary | ICD-10-CM | POA: Diagnosis not present

## 2019-07-16 DIAGNOSIS — M25562 Pain in left knee: Secondary | ICD-10-CM | POA: Diagnosis not present

## 2019-08-05 DIAGNOSIS — M1712 Unilateral primary osteoarthritis, left knee: Secondary | ICD-10-CM | POA: Diagnosis not present

## 2019-08-05 DIAGNOSIS — M25562 Pain in left knee: Secondary | ICD-10-CM | POA: Diagnosis not present

## 2019-08-13 DIAGNOSIS — M1712 Unilateral primary osteoarthritis, left knee: Secondary | ICD-10-CM | POA: Diagnosis not present

## 2019-08-20 DIAGNOSIS — M1712 Unilateral primary osteoarthritis, left knee: Secondary | ICD-10-CM | POA: Diagnosis not present

## 2019-08-20 DIAGNOSIS — M25562 Pain in left knee: Secondary | ICD-10-CM | POA: Diagnosis not present

## 2019-09-13 NOTE — Progress Notes (Signed)
Cardiology Office Note:    Date:  09/14/2019   ID:  Jason Lewis, DOB Jul 09, 1951, MRN [id number]  PCP:  Deland Pretty, MD  Cardiologist:  Fransico Him, MD    Referring MD: Deland Pretty, MD   Chief Complaint  Patient presents with  . Coronary Artery Disease  . Hypertension  . Hyperlipidemia  . Palpitations    History of Present Illness:    Jason Lewis is a 69 y.o. 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 He was put on Plavix because of his NSTEMI.    He had been on high-dose atorvastatin but developed myalgias and his Lipitor was decreased to 40 mg daily.  He was last seen by Estella Husk, PA and was complaining of palpitations.  It was recommended that he cut back on caffeine and sugar and his sx resolved. He had a nuclear stress test 09/2018 for preop clearance for knee surgery and this was normal.   He is here today for followup and is doing well.  He denies any chest pain or pressure,  PND, orthopnea, LE edema, dizziness,  or syncope. Once in a while he will notice an extra thump in his chest but no sustained palpitations.  He has had some DOE recently with exertional activities but has put on 15lbs recently due to a bad knee and is deconditioned.  He tells me that he could walk 2 blocks or 2 flights of stairs without any problems. He is going to have knee surgery in the next few months.  He is compliant with his meds and is tolerating meds with no SE.    Past Medical History:  Diagnosis Date  . CAD in native artery    a.  CABG x 4 utilizing LIMA ot LAD, SVG to Ramus, SVG to Diagonal #2, and SVG to PDA on 05/19/17.  . Chest pain 05/12/2017  . Essential hypertension   . 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 Hyperglycemia    a. A1C 5.7 in 04/2017.  Marland Kitchen Hyperlipidemia   . Medical history non-contributory   . Non-ST elevation (NSTEMI) myocardial infarction [facility])      Past Surgical History:  Procedure Laterality Date  . CARDIAC CATHETERIZATION  05/13/2017   Dr Linard Millers 111  . CORONARY ARTERY BYPASS GRAFT N/A 05/19/2017   Procedure: CORONARY ARTERY BYPASS GRAFTING (CABG) x 4;  Surgeon: Ivin Poot, MD;  Location: [place];  Service: Open Heart Surgery;  Laterality: N/A;  . HERNIA REPAIR    . KNEE ARTHROSCOPY WITH SUBCHONDROPLASTY Left 01/13/2019   Procedure: Left knee arthroscopic partial medial and lateral meniscectomies with medial tibial and medial femoral condyle subchondroplasty;  Surgeon: [name], MD;  Location: [facility];  Service: Orthopedics;  Laterality: Left;  . KNEE SURGERY Left 2005  . LEFT HEART CATH AND CORONARY ANGIOGRAPHY N/A 05/13/2017   Procedure: LEFT HEART CATH AND CORONARY ANGIOGRAPHY;  Surgeon: Belva Crome, MD;  Location: [place] [facility];  Service: Cardiovascular;  Laterality: N/A;  . TEE WITHOUT CARDIOVERSION N/A 05/19/2017   Procedure: TRANSESOPHAGEAL ECHOCARDIOGRAM (TEE);  Surgeon: Prescott Gum, Collier Salina, MD;  Location: [place];  Service: Open Heart Surgery;  Laterality: N/A;  . TONSILLECTOMY    . ULTRASOUND GUIDANCE FOR VASCULAR ACCESS  05/13/2017   Procedure: Ultrasound Guidance For Vascular Access;  Surgeon: Belva Crome,  MD;  Location: [facility];  Service: Cardiovascular;;    Current Medications: Current Meds  Medication Sig  . aspirin EC 81 MG EC tablet Take 1 tablet (81 mg total) by mouth daily.  . Metoprolol Succinate 25 MG CS24 Take 25 mg by mouth daily.     Allergies:   Patient has no known allergies.   Social History   Socioeconomic History  . Marital status: Married    Spouse name: Not on file  . Number of children: Not on file  . Years of education: Not on file  . Highest education level: Not on file  Occupational History  . Occupation: Painter  Tobacco Use  . Smoking status: Never Smoker  . Smokeless tobacco: Never Used  Substance and Sexual Activity  .  Alcohol use: Yes    Alcohol/week: 7.0 standard drinks    Types: 7 Glasses of wine per week  . Drug use: No  . Sexual activity: Not on file  Other Topics Concern  . Not on file  Social History Narrative  . Not on file   Social Determinants of Health   Financial Resource Strain:   . Difficulty of Paying Living Expenses: Not on file  Food Insecurity:   . Worried About Charity fundraiser in the Last Year: Not on file  . Ran Out of Food in the Last Year: Not on file  Transportation Needs:   . Lack of Transportation (Medical): Not on file  . Lack of Transportation (Non-Medical): Not on file  Physical Activity:   . Days of Exercise per Week: Not on file  . Minutes of Exercise per Session: Not on file  Stress:   . Feeling of Stress : Not on file  Social Connections:   . Frequency of Communication with Friends and Family: Not on file  . Frequency of Social Gatherings with Friends and Family: Not on file  . Attends Religious Services: Not on file  . Active Member of Clubs or Organizations: Not on file  . Attends Archivist Meetings: Not on file  . Marital Status: Not on file     Family History: [name] family history includes Angina in his father; Coronary artery disease in his brother.  ROS:   Please see the history of present illness.    ROS  All other systems reviewed and negative.   EKGs/Labs/Other Studies Reviewed:    The following studies were reviewed today: Nuclear stress test, outside labs on [facility]  EKG:  EKG is  ordered today.  The ekg ordered today demonstrates NSR with IRBBB  Recent Labs: 10/12/2018: ALT 21; Platelets 250; TSH 4.870 01/13/2019: BUN 15; Creatinine, Ser 1.00; Hemoglobin 13.6; Potassium 4.2; Sodium 139   Recent Lipid Panel    Component Value Date/Time   CHOL 113 10/12/2018 0735   TRIG 89 10/12/2018 0735   HDL 44 10/12/2018 0735   CHOLHDL 2.6 10/12/2018 0735   CHOLHDL 4.8 05/12/2017 1832   VLDL 25 05/12/2017 1832   LDLCALC 51  10/12/2018 0735    Physical Exam:    VS:  BP 134/80   Pulse (!) 58   Ht 5\' 8"  (1.727 m)   Wt 186 lb 6.4 oz (84.6 kg)   BMI 28.34 kg/m     Wt Readings from Last 3 Encounters:  09/14/19 186 lb 6.4 oz (84.6 kg)  01/13/19 183 lb 2 oz (83.1 kg)  10/12/18 184 lb (83.5 kg)     GEN:  Well nourished, well  developed in no acute distress HEENT: Normal NECK: No JVD; No carotid bruits LYMPHATICS: No lymphadenopathy CARDIAC: RRR, no murmurs, rubs, gallops RESPIRATORY:  Clear to auscultation without rales, wheezing or rhonchi  ABDOMEN: Soft, non-tender, non-distended MUSCULOSKELETAL:  No edema; No deformity  SKIN: Warm and dry NEUROLOGIC:  Alert and oriented x 3 PSYCHIATRIC:  Normal affect   ASSESSMENT:    1. Coronary artery disease involving native coronary artery of native heart without angina pectoris   2. Essential hypertension   3. Hyperlipidemia LDL goal <70   4. Palpitation    PLAN:    In order of problems listed above:  1.  ASCAD -s/p NSTEMI followed by CABG x4 with LIMA to the LAD, SVG to the ramus, SVG to the diagonal 2, SVG to PDA 05/19/17.  -he has not had any anginal CP -he has noticed some DOE recently with strenuous exercise due to 15lb weight gain and deconditioning from knee problems but is able to achieve 4.5 mets of physical exertion without any symptoms -I think he is stable from a cardiac standpoint for knee surgery and his Revised cardiac risk index is low at 0.9% for major cardiac event in the periop period.  This is considered an acceptable risk to proceed with surgery.  -continue ASA 81mg  daily, BB and statin  2.  HTN -BP controlled -continue Toprol XL 25mg  daily  3.  HLD -LDL goal < 70 -LDL was reviewed on outside labs from PCP on [facility] and was at goal at 51 and ALT 21 a year ago -repeat FLP and ALT -continue atorvastatin 40mg  daily  -he got myalgias on higher doses of statin  4.  Palpitations  -improvedafter cutting back on caffeine and are very  infrequent now   Medication Adjustments/Labs and Tests Ordered: Current medicines are reviewed at length with the patient today.  Concerns regarding medicines are outlined above.  Orders Placed This Encounter  Procedures  . EKG 12-Lead   No orders of the defined types were placed in this encounter.   Signed, Fransico Him, MD  09/14/2019 4:20 PM    [facility]

## 2019-09-14 ENCOUNTER — Ambulatory Visit: Payer: PPO | Admitting: Cardiology

## 2019-09-14 ENCOUNTER — Other Ambulatory Visit: Payer: Self-pay

## 2019-09-14 ENCOUNTER — Encounter: Payer: Self-pay | Admitting: Cardiology

## 2019-09-14 VITALS — BP 134/80 | HR 58 | Ht 68.0 in | Wt 186.4 lb

## 2019-09-14 DIAGNOSIS — R002 Palpitations: Secondary | ICD-10-CM

## 2019-09-14 DIAGNOSIS — E785 Hyperlipidemia, unspecified: Secondary | ICD-10-CM

## 2019-09-14 DIAGNOSIS — I2510 Atherosclerotic heart disease of native coronary artery without angina pectoris: Secondary | ICD-10-CM | POA: Diagnosis not present

## 2019-09-14 DIAGNOSIS — I10 Essential (primary) hypertension: Secondary | ICD-10-CM

## 2019-09-14 NOTE — Patient Instructions (Signed)
Medication Instructions:  Your physician recommends that you continue on your current medications as directed. Please refer to the Current Medication list given to you today.  *If you need a refill on your cardiac medications before your next appointment, please call your pharmacy*  Lab Work: Fasting lipids and ALT If you have labs (blood work) drawn today and your tests are completely normal, you will receive your results only by: Marland Kitchen MyChart Message (if you have MyChart) OR . A paper copy in the mail If you have any lab test that is abnormal or we need to change your treatment, we will call you to review the results.  Follow-Up: At [facility], you and your health needs are our priority.  As part of our continuing mission to provide you with exceptional heart care, we have created designated Provider Care Teams.  These Care Teams include your primary Cardiologist (physician) and Advanced Practice Providers (APPs -  Physician Assistants and Nurse Practitioners) who all work together to provide you with the care you need, when you need it.  Your next appointment:   1 year(s)  The format for your next appointment:   Either In Person or Virtual  Provider:   Fransico Him, MD

## 2019-09-21 ENCOUNTER — Other Ambulatory Visit: Payer: PPO

## 2019-09-21 ENCOUNTER — Telehealth: Payer: Self-pay

## 2019-09-21 ENCOUNTER — Other Ambulatory Visit: Payer: Self-pay

## 2019-09-21 DIAGNOSIS — I2510 Atherosclerotic heart disease of native coronary artery without angina pectoris: Secondary | ICD-10-CM | POA: Diagnosis not present

## 2019-09-21 DIAGNOSIS — E785 Hyperlipidemia, unspecified: Secondary | ICD-10-CM

## 2019-09-21 LAB — LIPID PANEL
Chol/HDL Ratio: 4.7 ratio (ref 0.0–5.0)
Cholesterol, Total: 199 mg/dL (ref 100–199)
HDL: 42 mg/dL (ref >39)
LDL Chol Calc (NIH): 129 mg/dL — ABNORMAL HIGH (ref 0–99)
Triglycerides: 158 mg/dL — ABNORMAL HIGH (ref 0–149)
VLDL Cholesterol Cal: 28 mg/dL (ref 5–40)

## 2019-09-21 LAB — ALT: ALT: 14 IU/L (ref 0–44)

## 2019-09-21 MED ORDER — ATORVASTATIN CALCIUM 40 MG PO TABS: 40.0000 mg | ORAL_TABLET | Freq: Every day | ORAL | 3 refills | Status: DC

## 2019-09-21 NOTE — Telephone Encounter (Signed)
Patient will restart atrovastatin 40 mg and come in for repeat blood work on 04/06

## 2019-09-21 NOTE — Telephone Encounter (Signed)
-----   Message from Sueanne Margarita, MD sent at 09/21/2019  4:17 PM EST ----- Restart prior dose of statin and repeat FLP and ALT in 6 weeks

## 2019-09-30 DIAGNOSIS — M1712 Unilateral primary osteoarthritis, left knee: Secondary | ICD-10-CM | POA: Diagnosis not present

## 2019-09-30 IMAGING — CR DG CHEST 2V
2 series · 2 of 2 positions shown · non-contrast
Comparison: None in PACs

CLINICAL DATA: Patient had an episode of chest pain radiating to
the left shoulder 3 days ago with recurrent symptoms this morning.
Persistent left shoulder achiness. No cardiac history. Never smoked.

EXAM:
CHEST  2 VIEW

[chest pa]
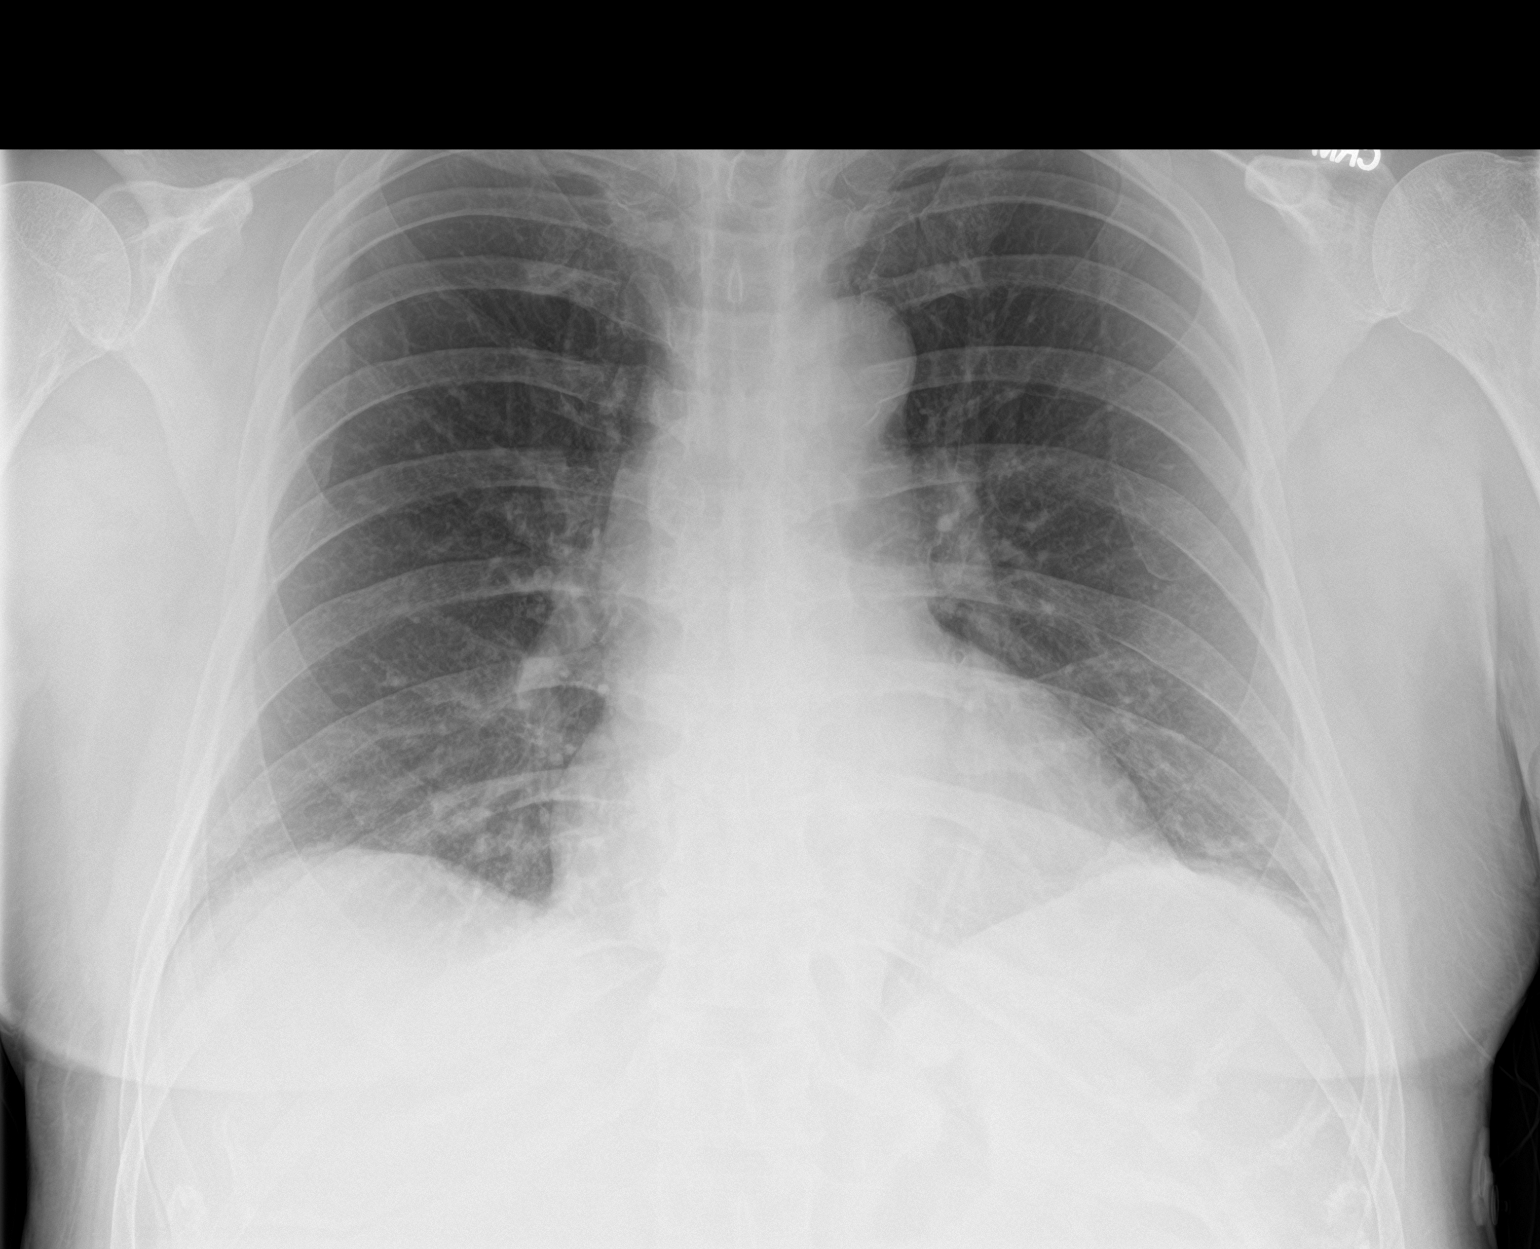

[chest lat]
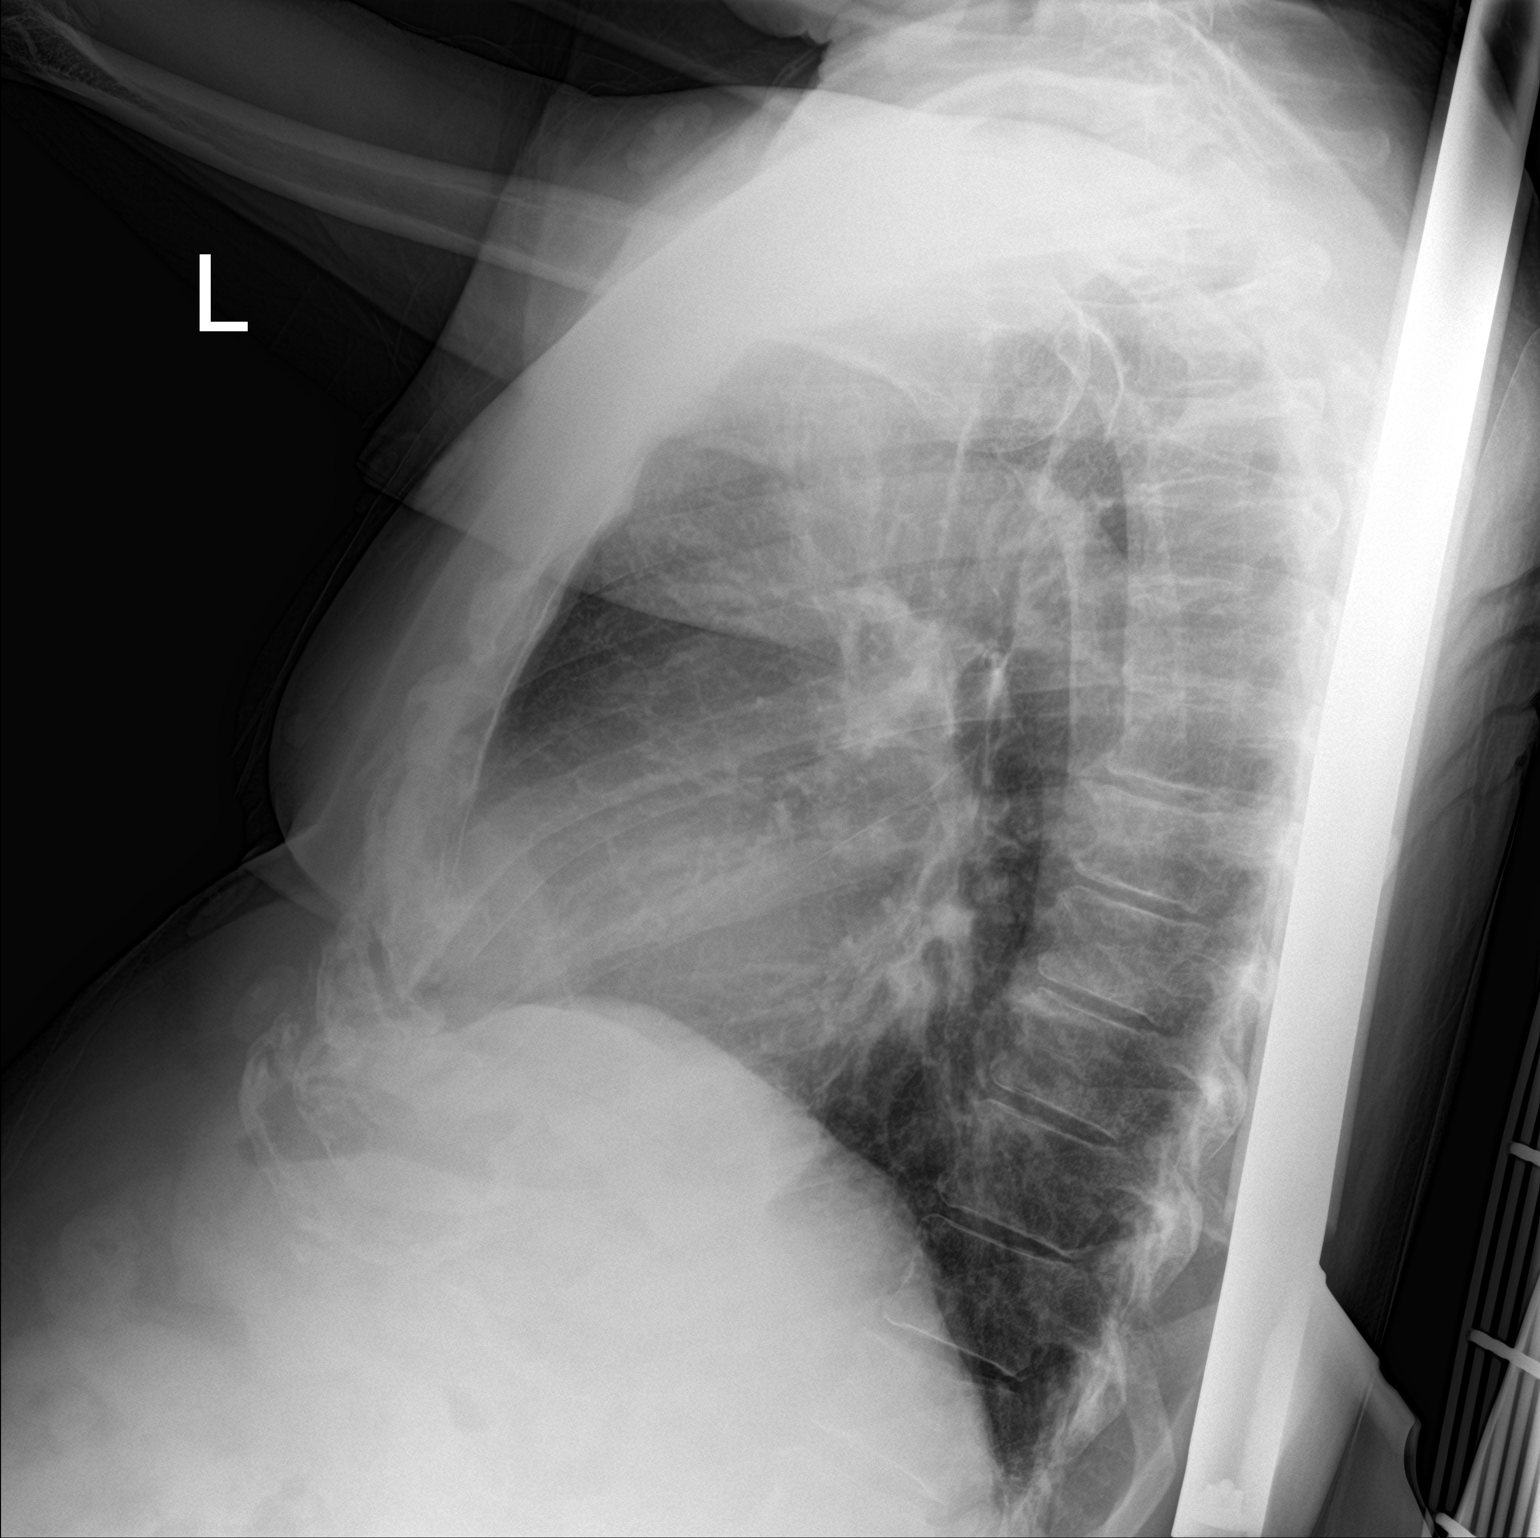

[2 of 2 positions shown; findings below may reference images not displayed]

FINDINGS: The lungs are adequately inflated and clear. The heart and pulmonary
vascularity are normal. The mediastinum is normal in width. The
trachea is midline. There is calcification in the wall of the aortic
arch. The bony thorax is unremarkable.
IMPRESSION: There is no acute cardiopulmonary abnormality.

Thoracic aortic atherosclerosis.

## 2019-10-01 ENCOUNTER — Telehealth: Payer: Self-pay

## 2019-10-01 NOTE — Telephone Encounter (Signed)
   [facility] Pre-operative Risk Assessment    Request for surgical clearance:  1. What type of surgery is being performed? LEFT TOTAL KNEE ARTHOPLASTY   2. When is this surgery scheduled? 11/08/19   3. What type of clearance is required (medical clearance vs. Pharmacy clearance to hold med vs. Both)? PHARAMCY  4. Are there any medications that need to be held prior to surgery and how long? ASA INSTRUCTIONS   5. Practice name and name of physician performing surgery? [facility]; DR Gaynelle Arabian   6. What is your office phone number [phone] ATTN: KELLY HANCOOK    7.   What is your office fax number [phone]  8.   Anesthesia type (None, local, MAC, general) ? CHOICE   Jason Lewis 10/01/2019, 4:43 PM  _________________________________________________________________   (provider comments below)

## 2019-10-04 NOTE — Telephone Encounter (Signed)
Okay to hold ASA 5-7 days prior to surgery.

## 2019-10-04 NOTE — Telephone Encounter (Signed)
   Primary Cardiologist: Fransico Him, MD  Chart reviewed as part of pre-operative protocol coverage.   Hx of CAD status post NSTEMI followed by CABG x4 with LIMA to the LAD, SVG to the ramus, SVG to the diagonal 2, SVG to PDA 05/19/17. Nuclear stress test 09/2018  was normal.   Per Dr. Radford Pax 09/14/19 "I think he is stable from a cardiac standpoint for knee surgery and his Revised cardiac risk index is low at 0.9% for major cardiac event in the periop period.  This is considered an acceptable risk to proceed with surgery".  Dr. Radford Pax, please give your recommendation regarding aspirin.   Please forward your response to [facility] PREOP.   Thank you   Leanor Kail, PA 10/04/2019, 8:56 AM

## 2019-10-04 NOTE — Telephone Encounter (Signed)
OK to hold ASA for procedure

## 2019-10-04 NOTE — Telephone Encounter (Signed)
I s/w [facility] Dr. Anne Fu office and obtained complete fax number > I will fax clearance notes to [phone]. I will remove from the pre op call back pool.

## 2019-10-07 IMAGING — DX DG CHEST 1V PORT
1 series · 1 of 1 positions shown · non-contrast
Comparison: 05/18/2017 in [REDACTED]:
Endotracheal tube terminates approximately 3.1 cm above the carina.

CLINICAL DATA: CABG.

EXAM:
PORTABLE CHEST 1 VIEW

[chest ap]
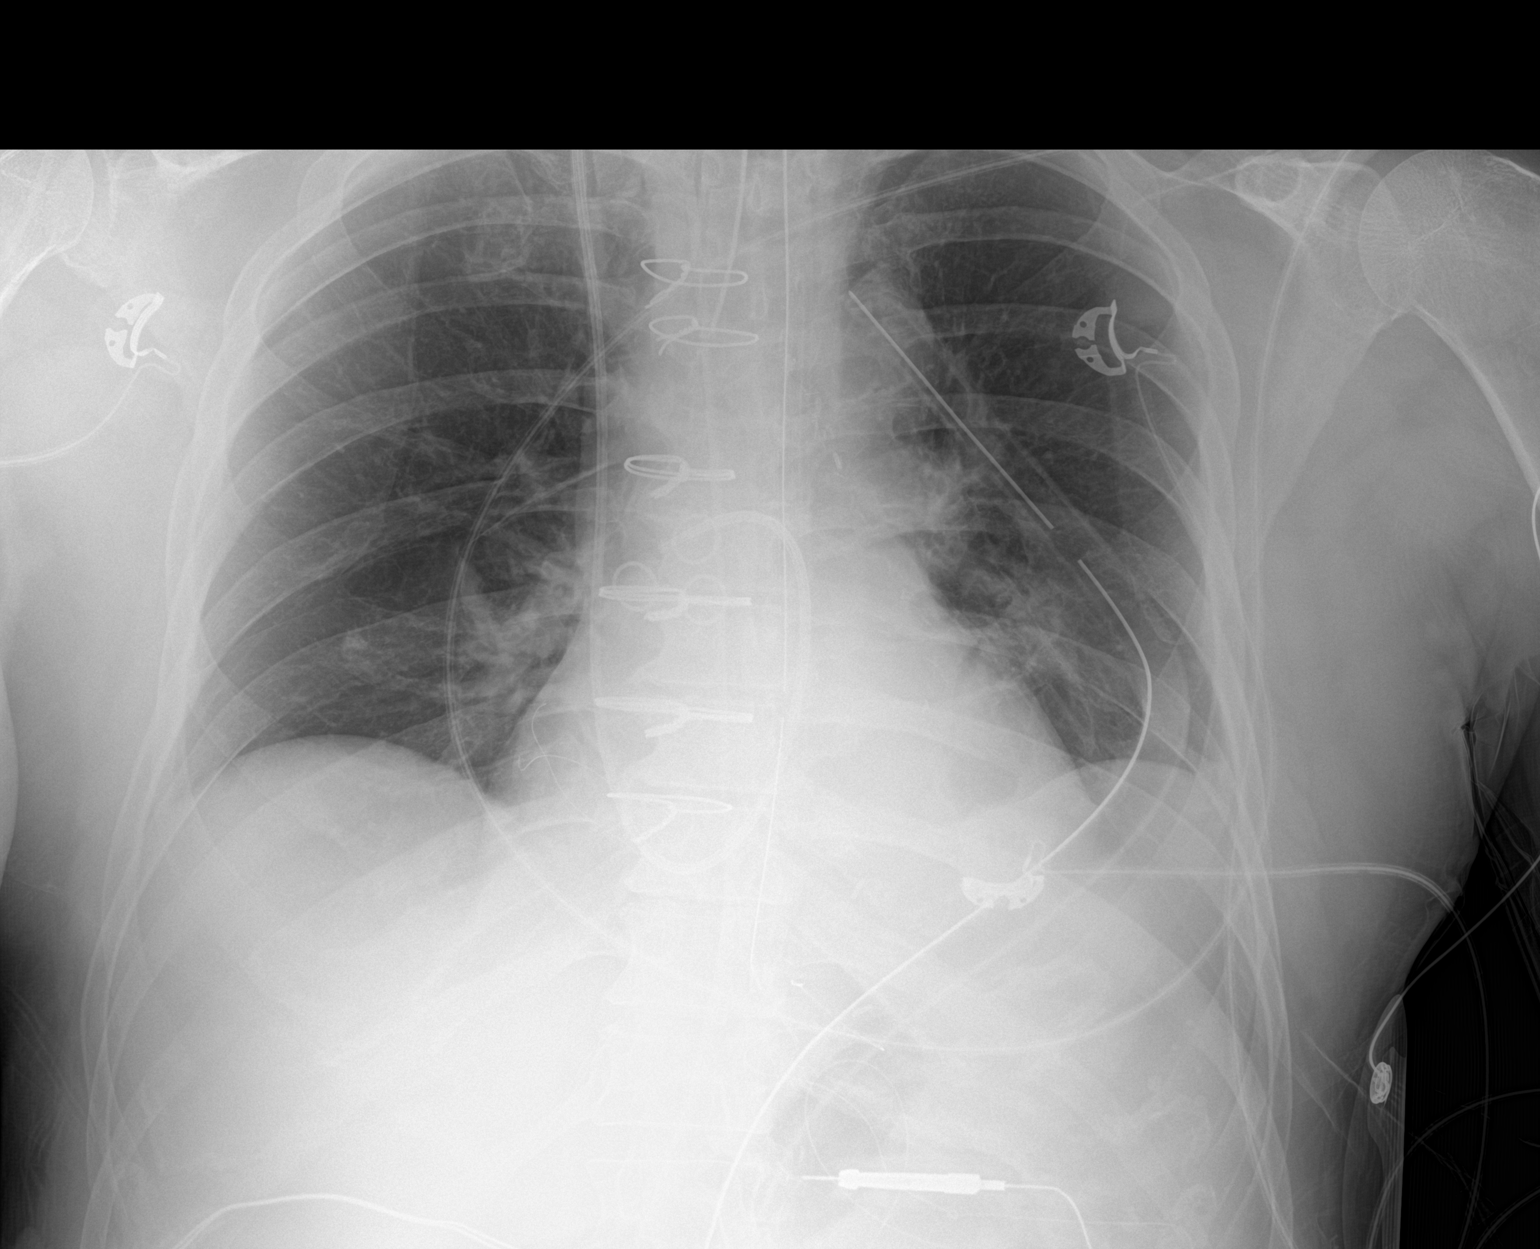

[1 of 1 positions shown; findings below may reference images not displayed]

Nasogastric tube terminates at the gastroesophageal junction. Right
IJ Swan-Ganz catheter tip projects over the proximal right pulmonary
artery. Left chest tube and epicardial pacer wires are in place.

Heart size is accentuated by AP semi upright technique and
postoperative state. Lungs are low in volume with left basilar
atelectasis. No definite pneumothorax.
IMPRESSION: 1. Low lung volumes with left basilar atelectasis.
2. No pneumothorax.
3. Nasogastric tube terminates at the gastroesophageal junction.

## 2019-10-08 IMAGING — CR DG CHEST 1V PORT
1 series · 1 of 1 positions shown · non-contrast
Comparison: 05/19/2017

CLINICAL DATA: Postop, chest tube

EXAM:
PORTABLE CHEST 1 VIEW

[AP]
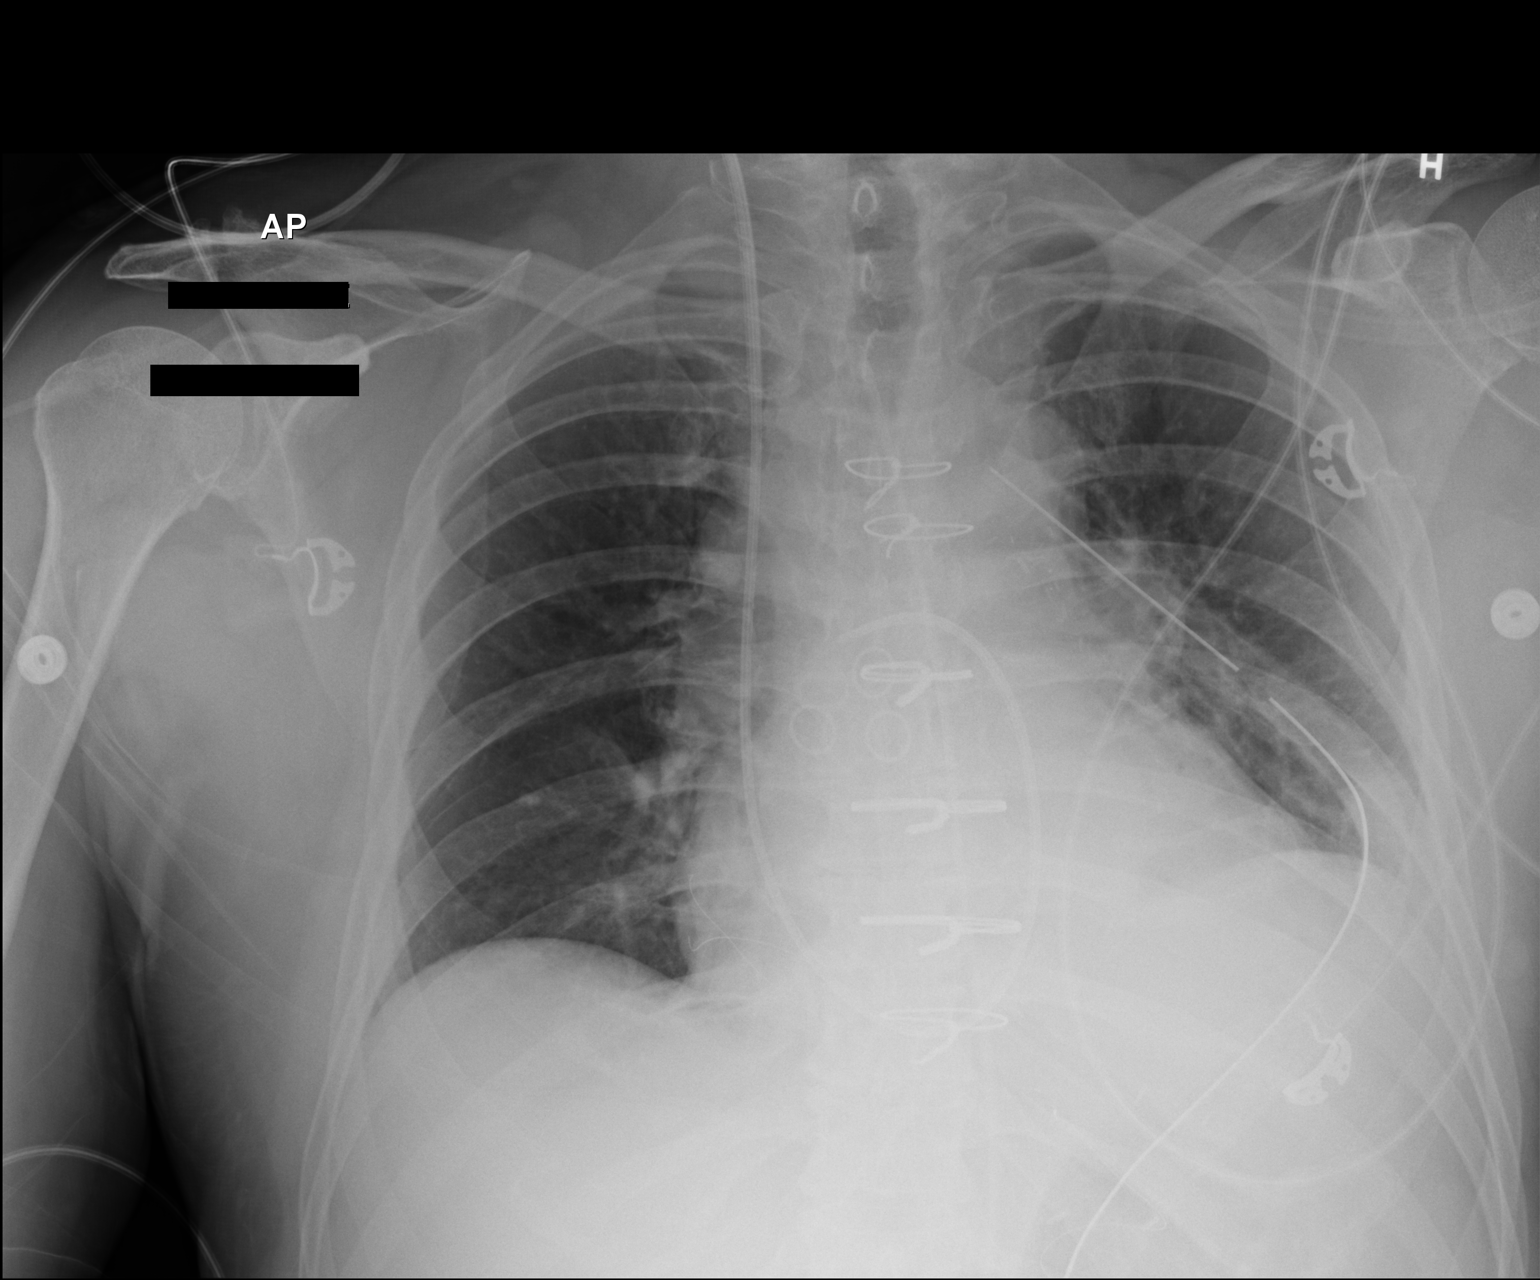

[1 of 1 positions shown; findings below may reference images not displayed]

FINDINGS: Interval removal of endotracheal tube and NG tube. Swan-Ganz
catheter and left chest tube remain in place, unchanged. No
pneumothorax. Left base and perihilar atelectasis. Right lung is
clear. No effusions.
IMPRESSION: Interval extubation. No pneumothorax. Areas of atelectasis in the
left lung.

## 2019-10-09 IMAGING — DX DG CHEST 1V PORT
1 series · 1 of 1 positions shown · non-contrast
Comparison: 05/20/2017

CLINICAL DATA: Postop CABG.

EXAM:
PORTABLE CHEST 1 VIEW

[chest ap]
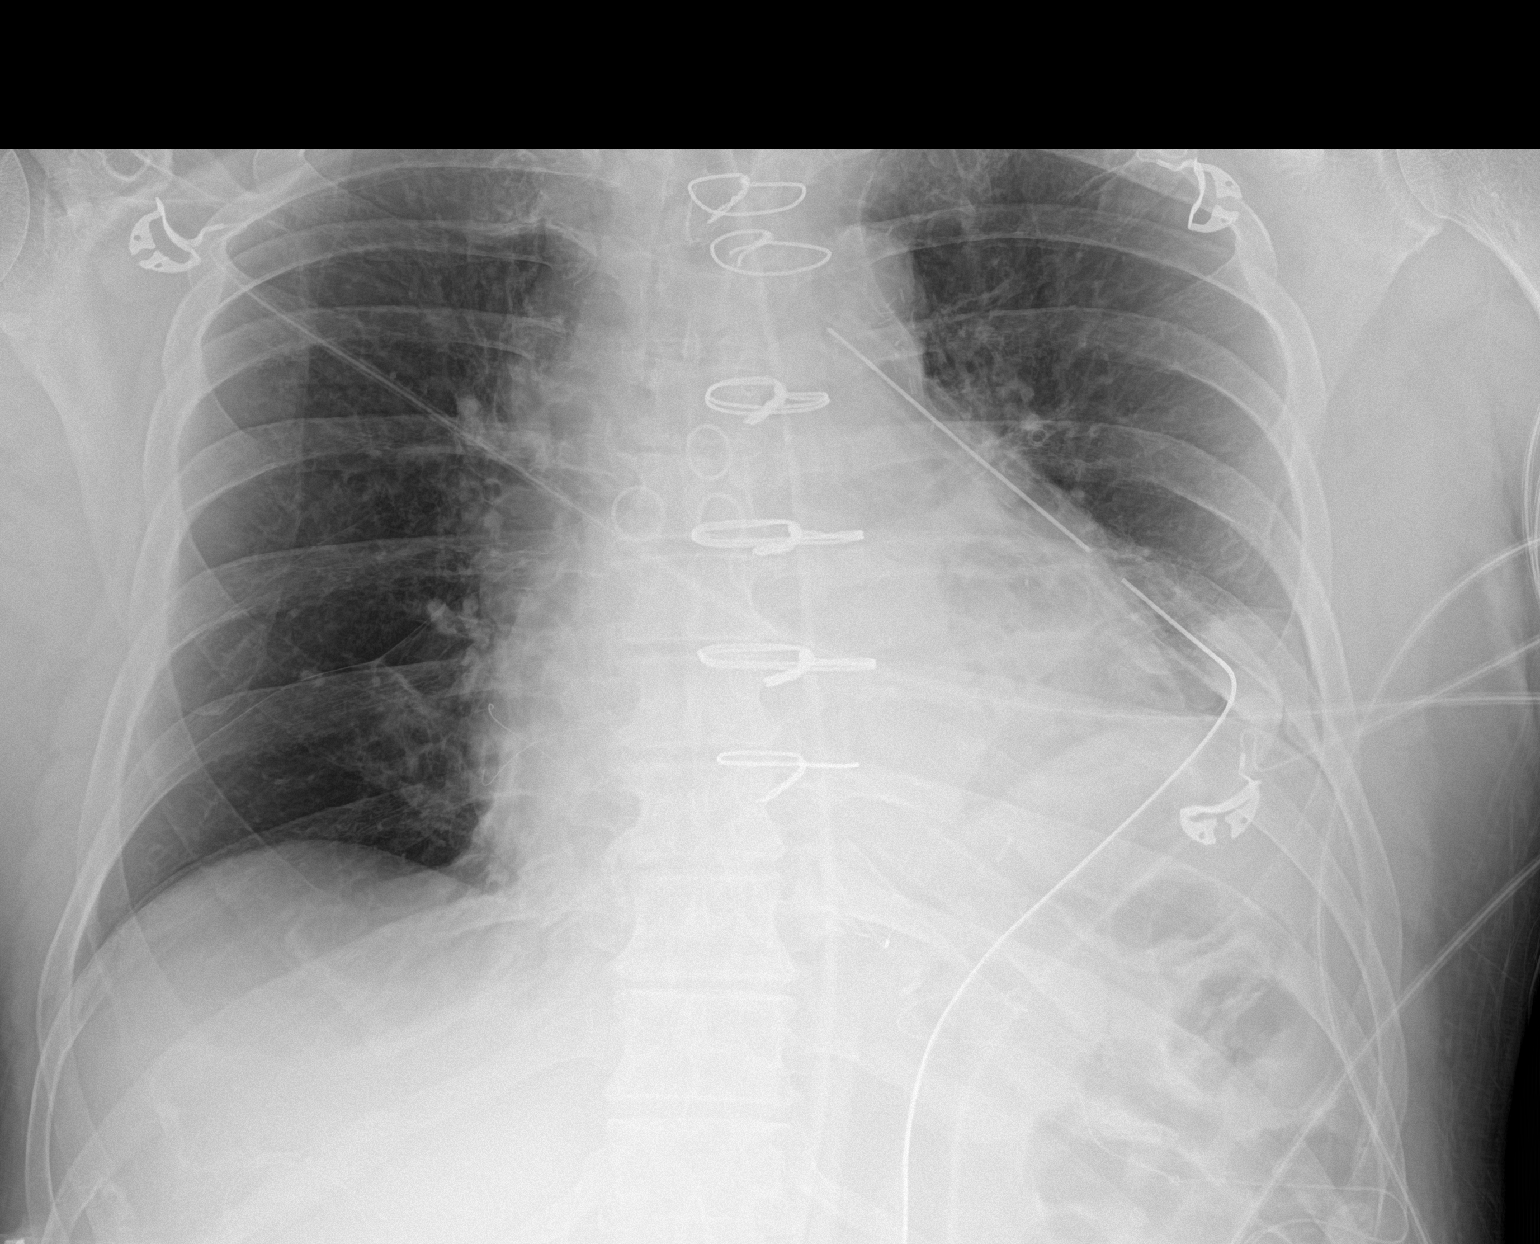

[1 of 1 positions shown; findings below may reference images not displayed]

FINDINGS: Sequelae of recent CABG are again identified. The Swan-Ganz catheter
has been removed. A right jugular sheath remains in place. A
mediastinal drain and left chest tube also remain. The
cardiomediastinal silhouette is unchanged. Left perihilar and left
basilar opacities have not significantly changed, with persistent
volume loss in this region resulting in elevation of the left
hemidiaphragm. The right lung remains clear. No sizable pleural
effusion or pneumothorax is identified.
IMPRESSION: 1. Interval removal of Swan-Ganz catheter.
2. Unchanged left lung atelectasis.

## 2019-10-10 IMAGING — DX DG CHEST 1V PORT
1 series · 1 of 1 positions shown · non-contrast
Comparison: 05/21/2017.

CLINICAL DATA: Left chest tube .

EXAM:
PORTABLE CHEST 1 VIEW

[chest ap]
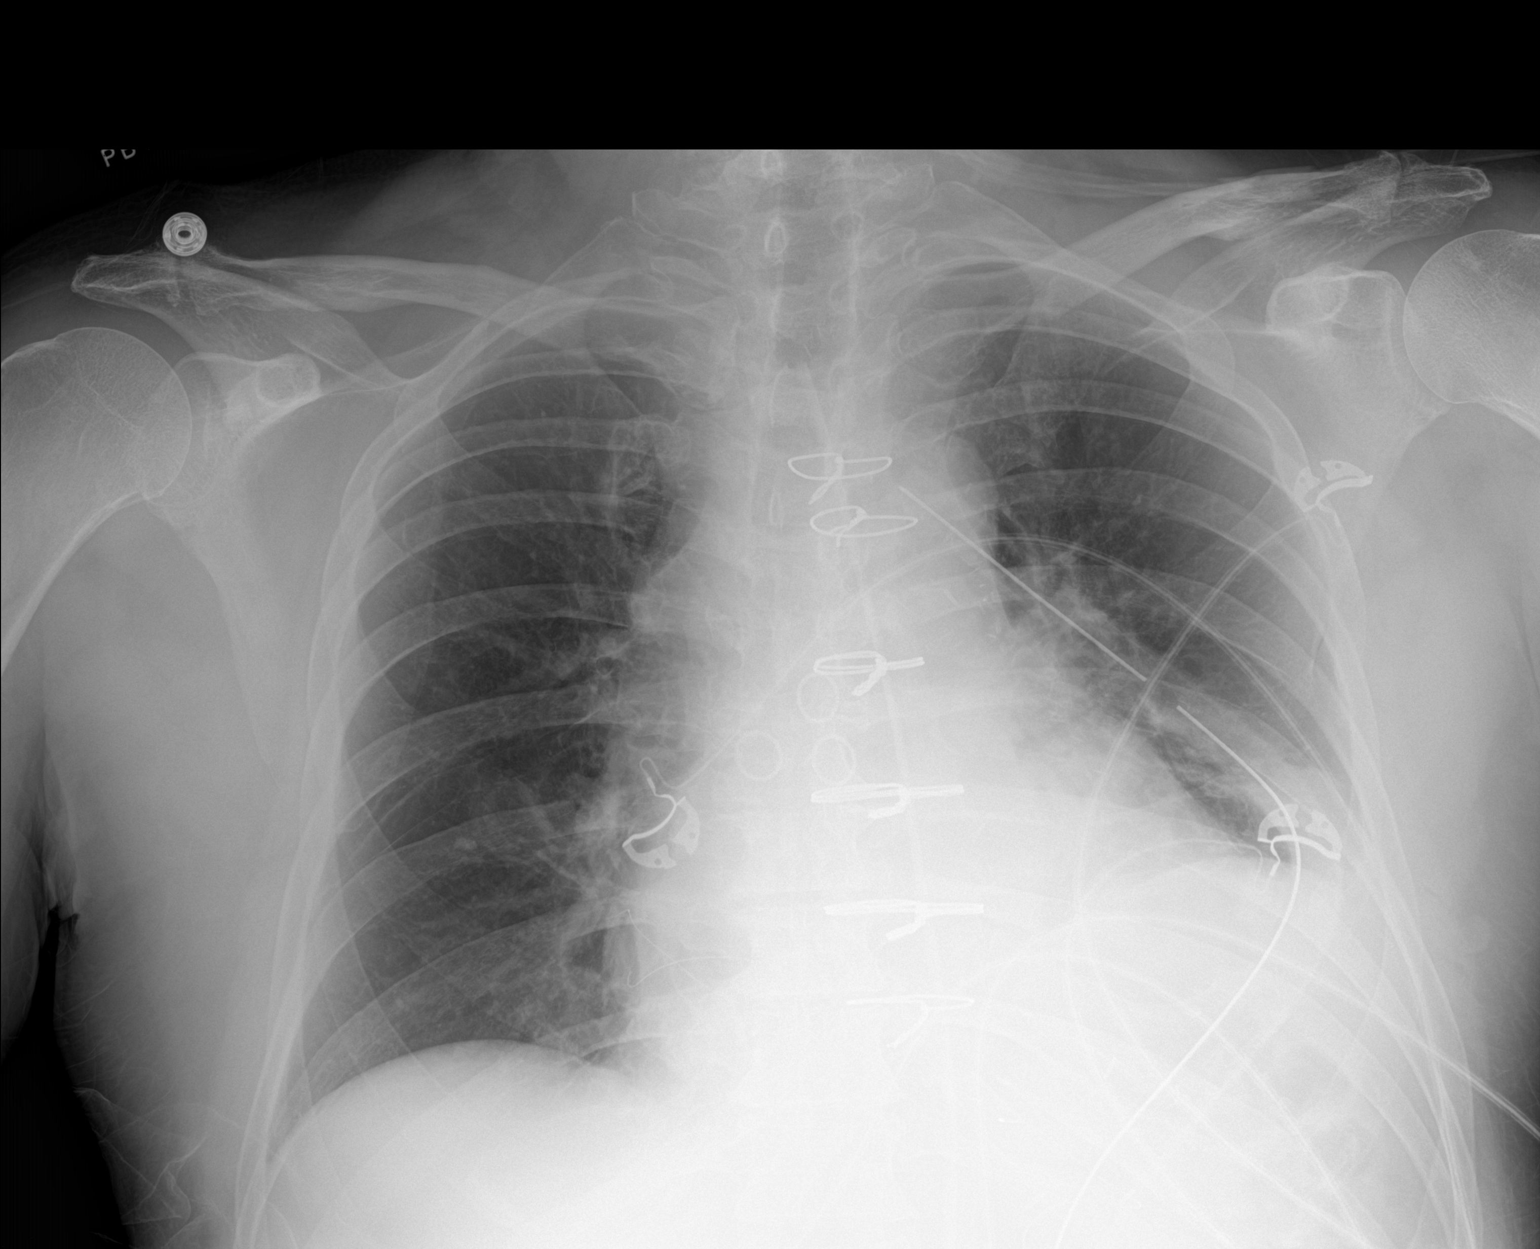

[1 of 1 positions shown; findings below may reference images not displayed]

FINDINGS: Interim removal right IJ sheath. Left chest tube and mediastinal
drainage catheter in stable position. Prior CABG. Cardiomegaly. Left
lower lobe infiltrate and left base atelectasis with elevation left
hemidiaphragm again noted. No significant change. No pneumothorax .
IMPRESSION: 1. Interim removal of right IJ sheath. Mediastinal drainage catheter
and left chest tube in stable position. No pneumothorax.

2. Persistent left base infiltrate and atelectasis with elevation
left hemidiaphragm.

## 2019-10-11 IMAGING — DX DG CHEST 2V
2 series · 2 of 2 positions shown · non-contrast
Comparison: 05/22/2017.

CLINICAL DATA: Postop CABG.

EXAM:
CHEST  2 VIEW

[chest pa]
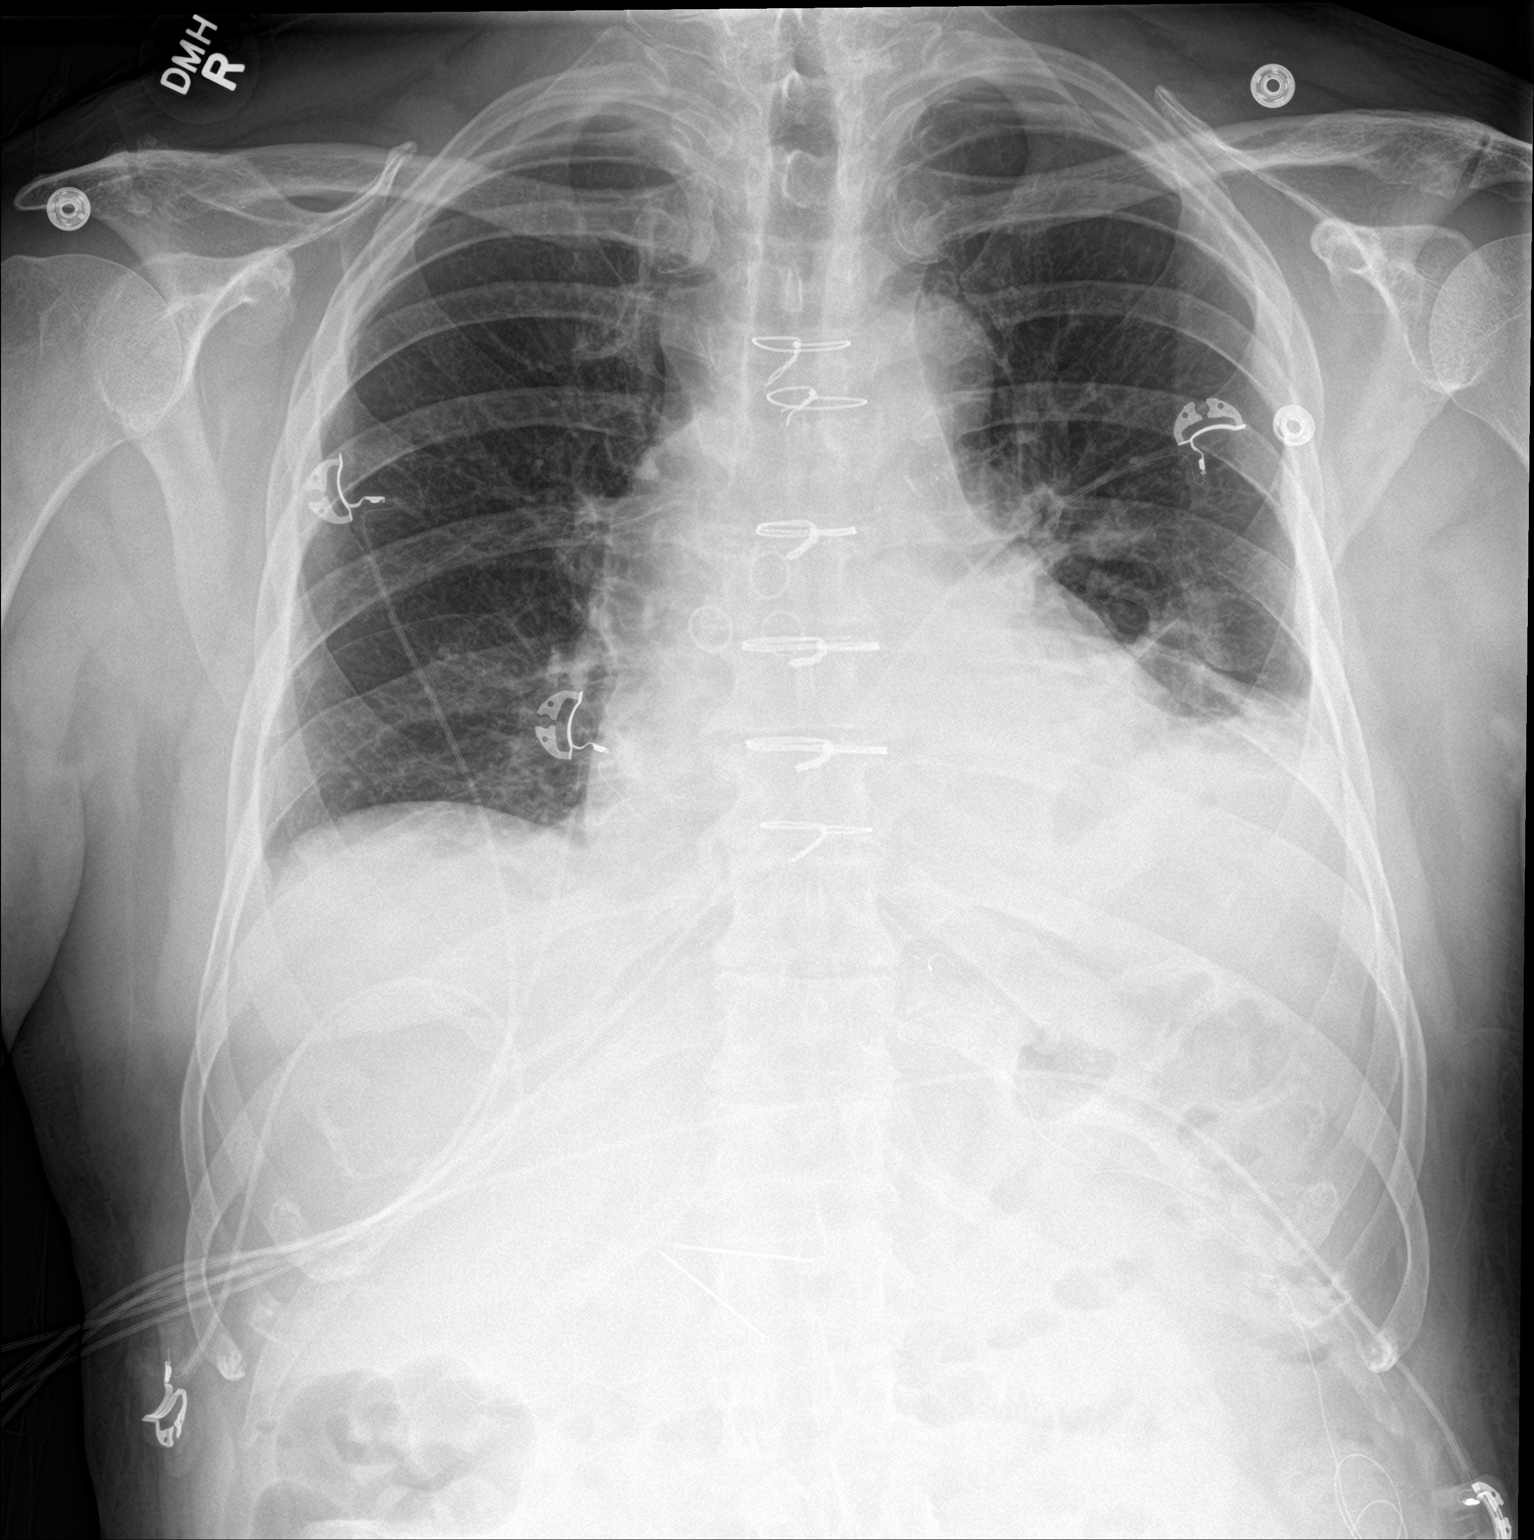

[chest lat]
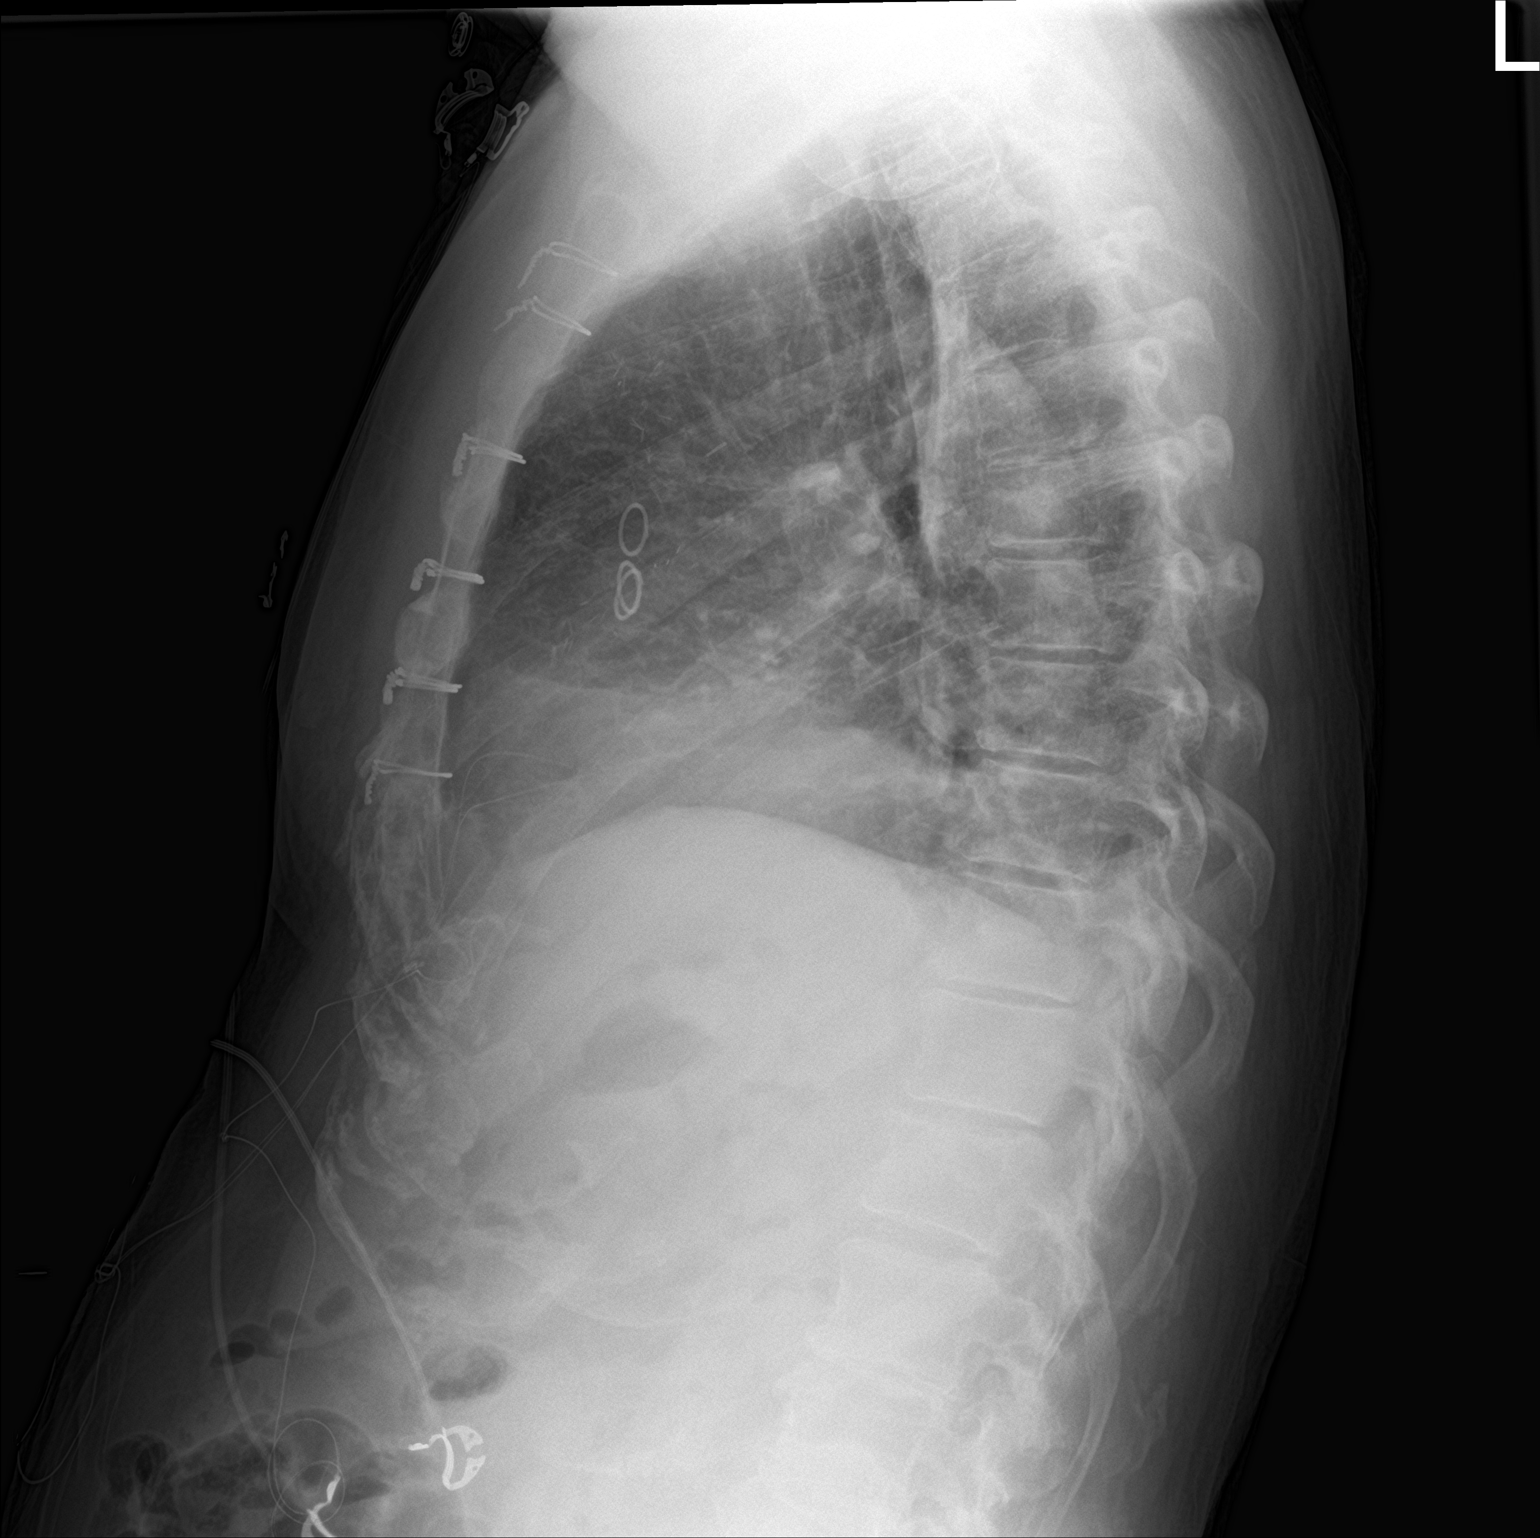

[2 of 2 positions shown; findings below may reference images not displayed]

FINDINGS: LEFT chest tube has been removed. There is a small LEFT apical
pneumothorax, less than 5%. Continued surveillance warranted. LEFT
lower lobe subsegmental atelectasis and effusion, not unexpected.
Cardiomegaly. Sequelae of CABG. RIGHT lung essentially clear.
IMPRESSION: Small less than 5% LEFT apical pneumothorax following LEFT chest
tube removal. Continued surveillance warranted. A call has been
placed to the PA.

## 2019-10-12 DIAGNOSIS — M1612 Unilateral primary osteoarthritis, left hip: Secondary | ICD-10-CM | POA: Diagnosis not present

## 2019-10-12 DIAGNOSIS — Z01818 Encounter for other preprocedural examination: Secondary | ICD-10-CM | POA: Diagnosis not present

## 2019-10-25 IMAGING — CT CT ABD-PELV W/O CM
2 of 4 series · 15 of 46 positions shown, 17 images · non-contrast
Comparison: None.

CLINICAL DATA: Spontaneous right flank ecchymoses. On
anticoagulation. Evaluate for retroperitoneal hemorrhage.

EXAM:
CT ABDOMEN AND PELVIS WITHOUT CONTRAST
TECHNIQUE: Multidetector CT imaging of the abdomen and pelvis was performed
following the standard protocol without IV contrast.

[Series 2: abd/ pelvis · axial · 0.77mm/px · z∈[-478,-33]mm · 12 of 99 slices shown, 14 images]
[im 5/99  soft-tissue]
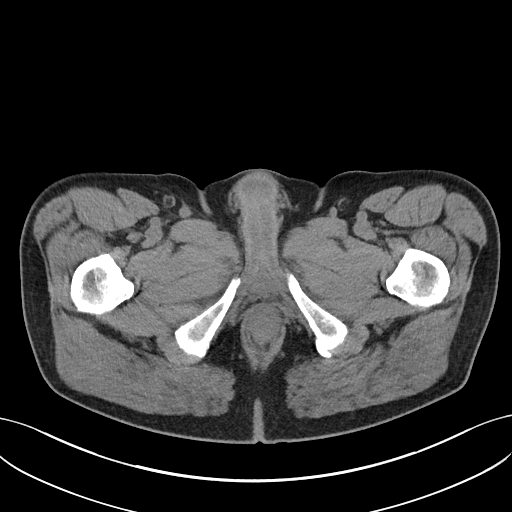
[im 5/99  bone]
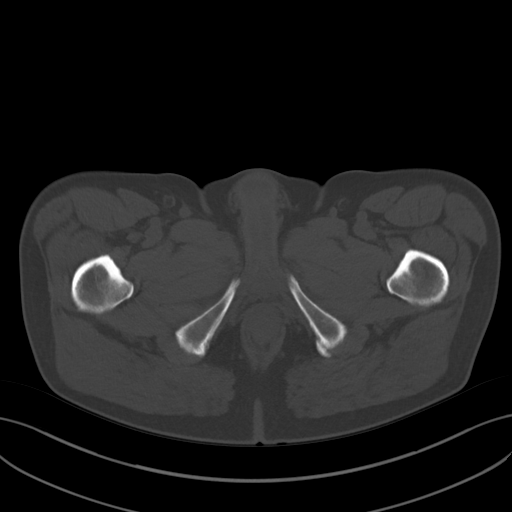
[im 13/99  soft-tissue]
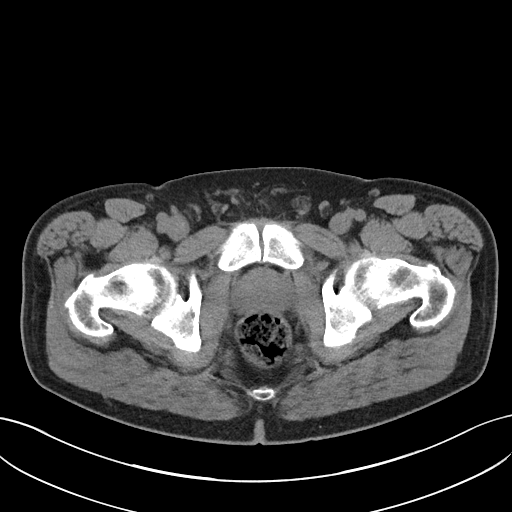
[im 22/99  soft-tissue]
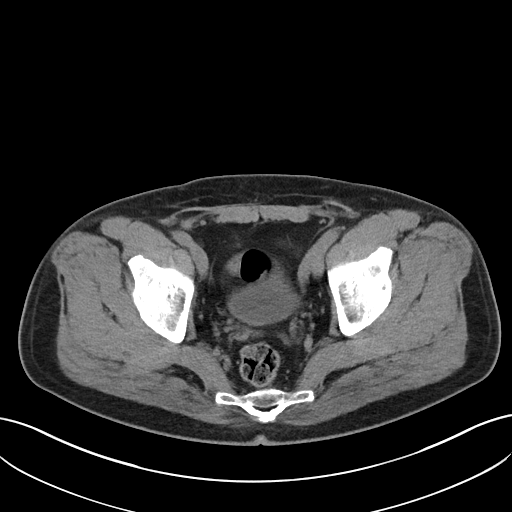
[im 30/99  soft-tissue]
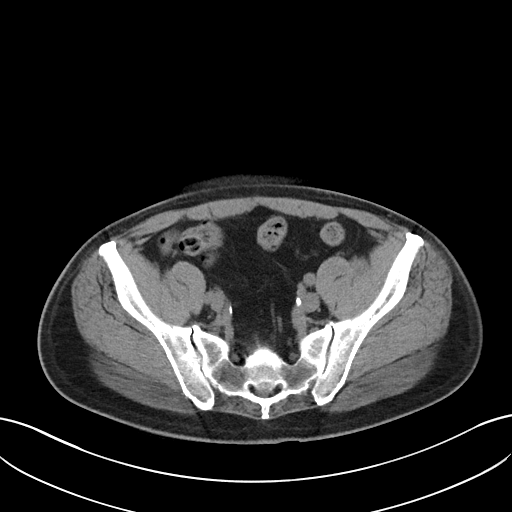
[im 39/99  soft-tissue]
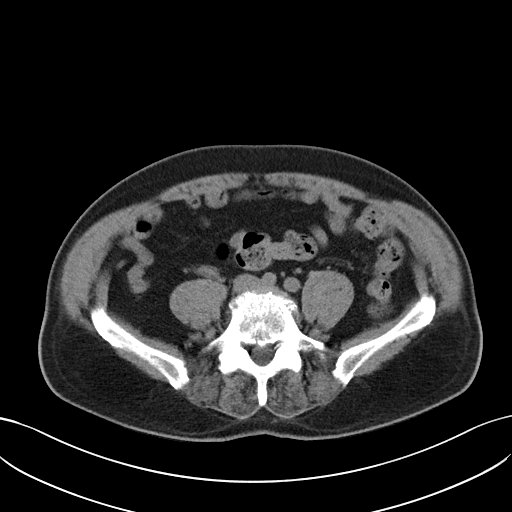
[im 47/99  soft-tissue]
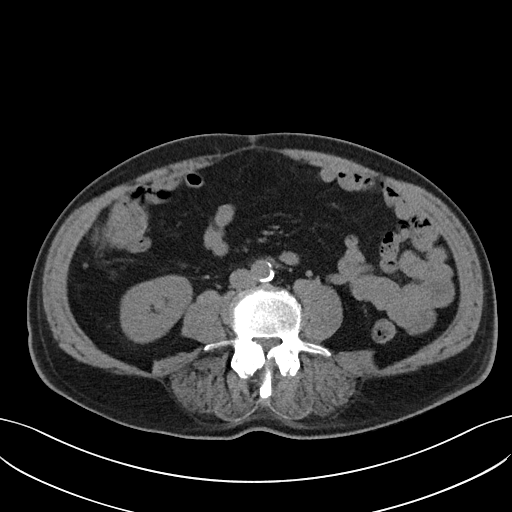
[im 52/99  soft-tissue]
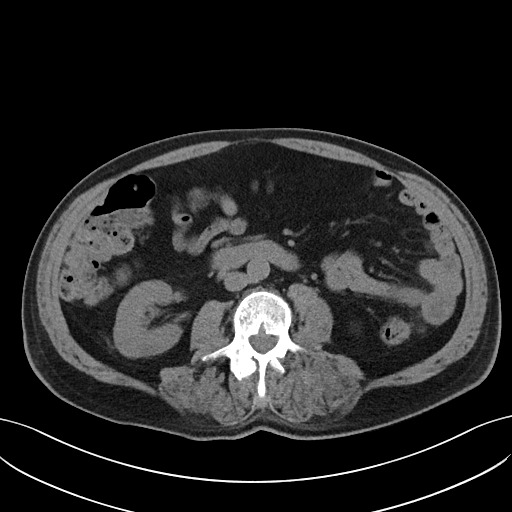
[im 60/99  soft-tissue]
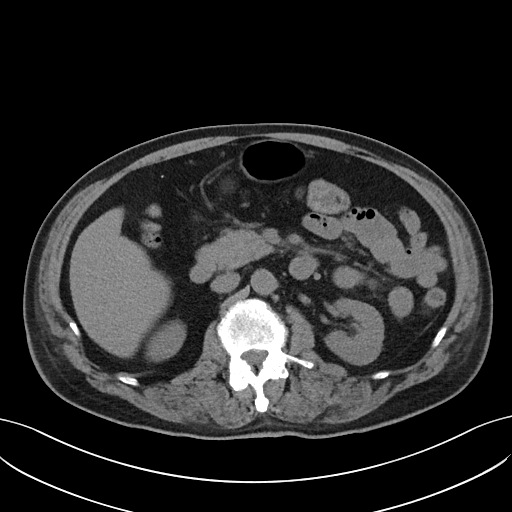
[im 69/99  soft-tissue]
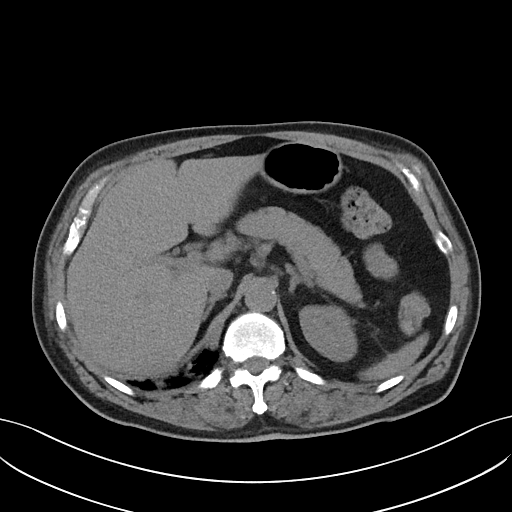
[im 69/99  bone]
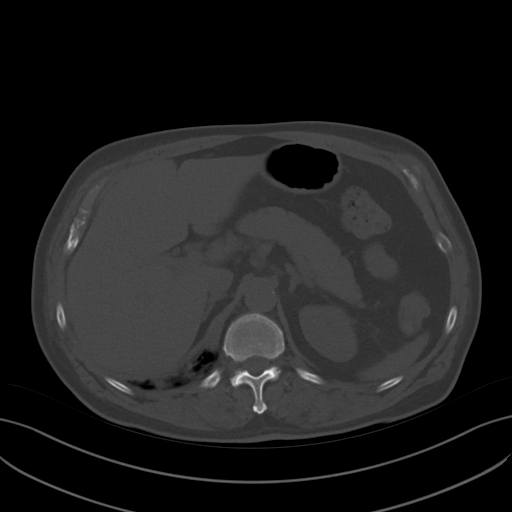
[im 77/99  soft-tissue]
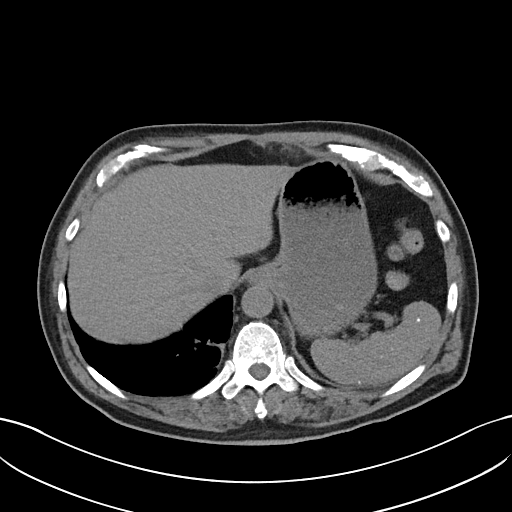
[im 86/99  soft-tissue]
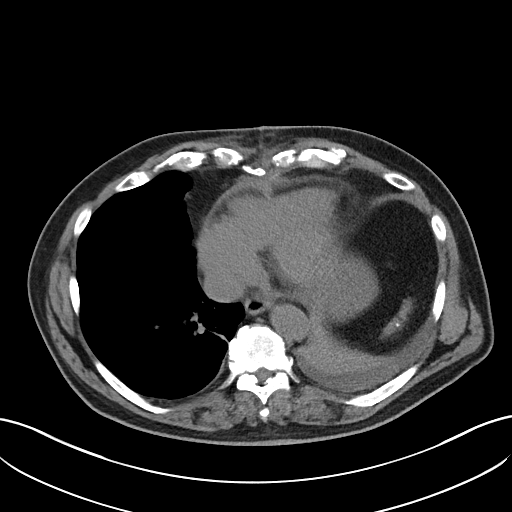
[im 94/99  soft-tissue]
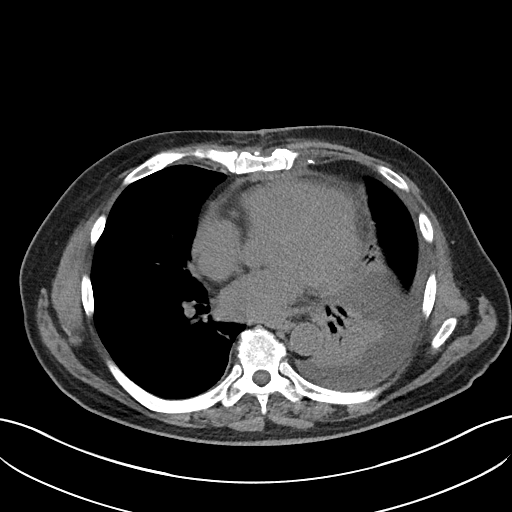

[Series 5: cor st · coronal · 0.71mm/px · 3 of 81 slices shown]
[im 27/81  soft-tissue]
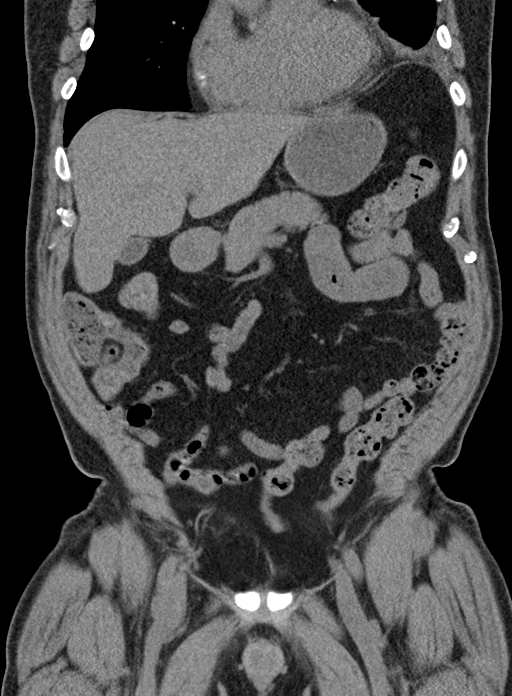
[im 36/81  soft-tissue]
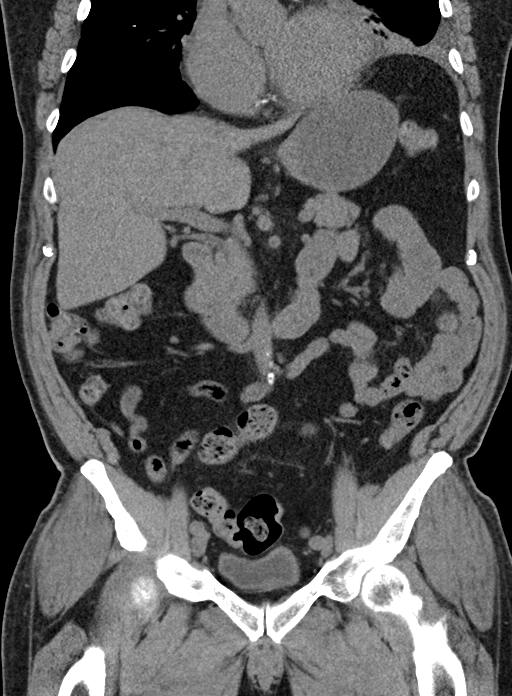
[im 45/81  soft-tissue]
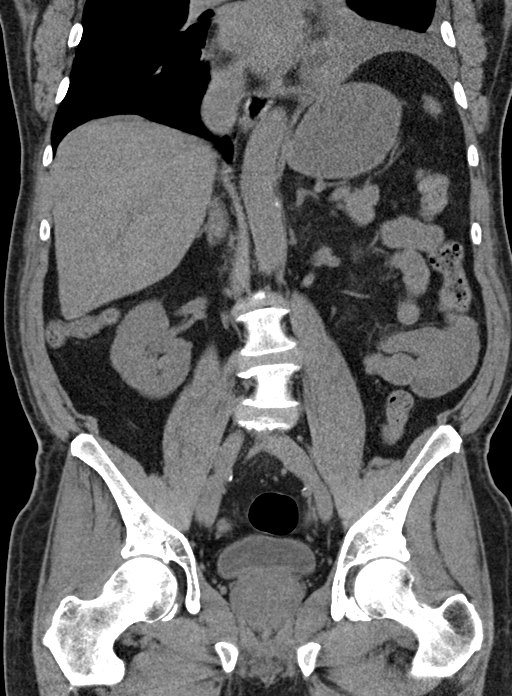

[15 of 46 positions shown; findings below may reference images not displayed]

FINDINGS: Lower chest: Small left pleural effusion and left lower lobe
atelectasis or consolidation. Calcified right hilar lymph nodes,
consistent with old granulomatous disease. Two tiny adjacent
noncalcified nodules in right middle lobe, largest with mean
diameter 5 mm on image [DATE], which are indeterminate.

Coronary artery calcification. Mild diffuse pericardial thickening
versus tiny effusion.

Hepatobiliary: No mass visualized on this unenhanced exam.
Gallbladder is unremarkable.

Pancreas: No mass or inflammatory process visualized on this
unenhanced exam.

Spleen: Within normal limits in size. Numerous tiny calcified
splenic granulomata.

Adrenals/Urinary tract: No evidence of urolithiasis or
hydronephrosis. Small fluid attenuation cyst in posterior midpole of
right kidney. Unremarkable unopacified urinary bladder.

Stomach/Bowel: No evidence of obstruction, inflammatory process, or
abnormal fluid collections.

Vascular/Lymphatic: No pathologically enlarged lymph nodes
identified. No evidence of abdominal aortic aneurysm. Aortic
atherosclerosis.

Reproductive:  No mass or other significant abnormality.

Other: No evidence of retroperitoneal hemorrhage or hemoperitoneum.
No evidence of abdominal wall hematoma.

Musculoskeletal:  No suspicious bone lesions identified.
IMPRESSION: No evidence of retroperitoneal hemorrhage or other acute findings
within the abdomen or pelvis.

Small left pleural effusion, and left lower lobe atelectasis or
consolidation.

Two tiny adjacent right middle lobe pulmonary nodules, largest
measuring 5 mm. No follow-up needed if patient is low-risk.
Non-contrast chest CT can be considered in 12 months if patient is
high-risk. This recommendation follows the consensus statement:
Guidelines for Management of Incidental Pulmonary Nodules Detected
[DATE].

Aortic and coronary artery atherosclerosis.

## 2019-11-01 ENCOUNTER — Telehealth: Payer: Self-pay | Admitting: Cardiology

## 2019-11-01 NOTE — Telephone Encounter (Signed)
Toni is calling stating he had labs performed about 10 days to 2 weeks ago by Dr. Wynelle Link. He would like to know if those labs can be used instead of him coming in for blood work tomorrow 11/01/19. Please advise.

## 2019-11-01 NOTE — Telephone Encounter (Signed)
Left message for patient advising him that we are wanting to check his lipids tomorrow. If this was done recently then he doesn't need them repeated. If not then he needs to come in.

## 2019-11-02 ENCOUNTER — Other Ambulatory Visit: Payer: PPO

## 2019-11-02 NOTE — H&P (Signed)
TOTAL KNEE ADMISSION H&P  Patient is being admitted for left total knee arthroplasty.  Subjective:  Chief Complaint: Left knee pain.  HPI: Jason Lewis, 69 y.o. male has a history of pain and functional disability in the left knee due to arthritis and has failed non-surgical conservative treatments for greater than 12 weeks to include corticosteriod injections and activity modification. Onset of symptoms was gradual, starting 2 years ago with rapidlly worsening course since that time. The patient noted prior procedures on the knee to include  arthroscopy and menisectomy on the left knee.  Patient currently rates pain in the left knee at 10 out of 10 with activity. Patient has worsening of pain with activity and weight bearing, pain that interferes with activities of daily living and crepitus. 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 by imaging studies. There is no active infection.  Patient Active Problem List   Diagnosis Date Noted  . Hyperlipidemia LDL goal <70 09/19/2017  . Palpitation 09/19/2017  . Thrombocytosis ([place]) 07/14/2017  . Essential hypertension 07/14/2017  . 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 NSTEMI (non-ST elevated myocardial infarction) ([place]) 05/13/2017  . Coronary artery disease   . Chest pain 05/12/2017    Past Medical History:  Diagnosis Date  . CAD in native artery    a.  CABG x 4 utilizing LIMA ot LAD, SVG to Ramus, SVG to Diagonal #2, and SVG to PDA on 05/19/17.  . Chest pain 05/12/2017  . Essential hypertension   . 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 Hyperglycemia    a. A1C 5.7 in 04/2017.  Marland Kitchen Hyperlipidemia   . Medical history non-contributory   . Non-ST elevation (NSTEMI) myocardial infarction [facility])     Past Surgical History:  Procedure Laterality Date  . CARDIAC CATHETERIZATION  05/13/2017   Dr Linard Millers 111  . CORONARY ARTERY BYPASS GRAFT N/A 05/19/2017   Procedure: CORONARY ARTERY BYPASS GRAFTING  (CABG) x 4;  Surgeon: Ivin Poot, MD;  Location: [place];  Service: Open Heart Surgery;  Laterality: N/A;  . HERNIA REPAIR    . KNEE ARTHROSCOPY WITH SUBCHONDROPLASTY Left 01/13/2019   Procedure: Left knee arthroscopic partial medial and lateral meniscectomies with medial tibial and medial femoral condyle subchondroplasty;  Surgeon: [name], MD;  Location: [facility];  Service: Orthopedics;  Laterality: Left;  . KNEE SURGERY Left 2005  . LEFT HEART CATH AND CORONARY ANGIOGRAPHY N/A 05/13/2017   Procedure: LEFT HEART CATH AND CORONARY ANGIOGRAPHY;  Surgeon: Belva Crome, MD;  Location: [place] [facility];  Service: Cardiovascular;  Laterality: N/A;  . TEE WITHOUT CARDIOVERSION N/A 05/19/2017   Procedure: TRANSESOPHAGEAL ECHOCARDIOGRAM (TEE);  Surgeon: Prescott Gum, Collier Salina, MD;  Location: [place];  Service: Open Heart Surgery;  Laterality: N/A;  . TONSILLECTOMY    . ULTRASOUND GUIDANCE FOR VASCULAR ACCESS  05/13/2017   Procedure: Ultrasound Guidance For Vascular Access;  Surgeon: Belva Crome, MD;  Location: [place] [facility];  Service: Cardiovascular;;    Prior to Admission medications   Medication Sig Start Date End Date Taking? Authorizing Provider  aspirin EC 81 MG EC tablet Take 1 tablet (81 mg total) by mouth daily. 05/24/17  Yes Gold, Wayne E, PA-C  atorvastatin (LIPITOR) 40 MG tablet Take 1 tablet (40 mg total) by mouth daily. 09/21/19  Yes Turner, Eber Hong, MD  metoprolol succinate (TOPROL-XL) 25 MG 24 hr tablet Take 25 mg by mouth daily. 10/04/19  Yes [provider]    No Known Allergies  Social History   Socioeconomic History  . Marital status: Married    Spouse name: Not on file  . Number of children: Not on file  . Years of education: Not on file  . Highest education level: Not on file  Occupational History  . Occupation: Painter  Tobacco Use  . Smoking status: Never Smoker  . Smokeless tobacco: Never Used  Substance and Sexual  Activity  . Alcohol use: Yes    Alcohol/week: 7.0 standard drinks    Types: 7 Glasses of wine per week  . Drug use: No  . Sexual activity: Not on file  Other Topics Concern  . Not on file  Social History Narrative  . Not on file   Social Determinants of Health   Financial Resource Strain:   . Difficulty of Paying Living Expenses:   Food Insecurity:   . Worried About Charity fundraiser in the Last Year:   . Arboriculturist in the Last Year:   Transportation Needs:   . Film/video editor (Medical):   Marland Kitchen Lack of Transportation (Non-Medical):   Physical Activity:   . Days of Exercise per Week:   . Minutes of Exercise per Session:   Stress:   . Feeling of Stress :   Social Connections:   . Frequency of Communication with Friends and Family:   . Frequency of Social Gatherings with Friends and Family:   . Attends Religious Services:   . Active Member of Clubs or Organizations:   . Attends Archivist Meetings:   Marland Kitchen Marital Status:   Intimate Partner Violence:   . Fear of Current or Ex-Partner:   . Emotionally Abused:   Marland Kitchen Physically Abused:   . Sexually Abused:       Tobacco Use: Low Risk   . Smoking Tobacco Use: Never Smoker  . Smokeless Tobacco Use: Never Used   Social History   Substance and Sexual Activity  Alcohol Use Yes  . Alcohol/week: 7.0 standard drinks  . Types: 7 Glasses of wine per week    Family History  Problem Relation Age of Onset  . Angina Father   . Coronary artery disease Brother        s/p CABG    Review of Systems  Constitutional: Negative for chills and fever.  HENT: Negative for congestion, sore throat and tinnitus.   Eyes: Negative for double vision, photophobia and pain.  Respiratory: Negative for cough, shortness of breath and wheezing.   Cardiovascular: Negative for chest pain, palpitations and orthopnea.  Gastrointestinal: Negative for heartburn, nausea and vomiting.  Genitourinary: Negative for dysuria, frequency and  urgency.  Musculoskeletal: Positive for joint pain.  Neurological: Negative for dizziness, weakness and headaches.    Objective:  Physical Exam: Well nourished and well developed.  General: Alert and oriented x3, cooperative and pleasant, no acute distress.  Head: normocephalic, atraumatic, neck supple.  Eyes: EOMI.  Respiratory: breath sounds clear in all fields, no wheezing, rales, or rhonchi. Cardiovascular: Regular rate and rhythm, no murmurs, gallops or rubs.  Abdomen: non-tender to palpation and soft, normoactive bowel sounds. Musculoskeletal:  Left Knee Exam:  Varus deformity. No effusion present. No swelling present. The range of motion is: 0 to 130 degrees.  Moderate crepitus on range of motion of the knee.  Positive medial joint line tenderness. No lateral joint line tenderness.  The knee is stable.   Calves soft and nontender. Motor  function intact in LE. Strength 5/5 LE bilaterally. Neuro: Distal pulses 2+. Sensation to light touch intact in LE.  Vital signs in last 24 hours: Blood pressure: 132/74 mmHg  Imaging Review Plain radiographs demonstrate severe degenerative joint disease of the left knee. The overall alignment is significant varus. The bone quality appears to be adequate for age and reported activity level.  Assessment/Plan:  End stage arthritis, left knee   The patient history, physical examination, clinical judgment of the provider and imaging studies are consistent with end stage degenerative joint disease of the left knee and total knee arthroplasty is deemed medically necessary. The treatment options including medical management, injection therapy arthroscopy and arthroplasty were discussed at length. The risks and benefits of total knee arthroplasty were presented and reviewed. The risks due to aseptic loosening, infection, stiffness, patella tracking problems, thromboembolic complications and other imponderables were discussed. The patient  acknowledged the explanation, agreed to proceed with the plan and consent was signed. Patient is being admitted for inpatient treatment for surgery, pain control, PT, OT, prophylactic antibiotics, VTE prophylaxis, progressive ambulation and ADLs and discharge planning. The patient is planning to be discharged home.  Anticipated LOS equal to or greater than 2 midnights due to - Age 31 and older with one or more of the following:  - Obesity  - Expected need for hospital services (PT, OT, Nursing) required for safe  discharge  - Anticipated need for postoperative skilled nursing care or inpatient rehab  - Active co-morbidities: Coronary Artery Disease OR   - Unanticipated findings during/Post Surgery: None  - Patient is a high risk of re-admission due to: None  Therapy Plans: Outpatient therapy at [facility] Disposition: Home with wife Planned DVT Prophylaxis: Xarelto 10 mg QD (hx CAD) DME Needed: None PCP: Deland Pretty, MD Cardiologist: Fransico Him, MD TXA: IV Allergies: NKDA Anesthesia Concerns: None BMI: 28  Other:  - Hx of MI with CABG in 2018 - Plan for 23 hour observation  - Patient was instructed on what medications to stop prior to surgery. - Follow-up visit in 2 weeks with Dr. Wynelle Link - Begin physical therapy following surgery - Pre-operative lab work as pre-surgical testing - Prescriptions will be provided in hospital at time of discharge  Theresa Duty, PA-C Orthopedic Surgery [facility] [place]

## 2019-11-02 NOTE — Patient Instructions (Addendum)
DUE TO COVID-19 ONLY TWO VISITORS ARE ALLOWED TO COME WITH YOU AND STAY IN THE WAITING ROOM ONLY DURING PRE OP AND PROCEDURE. THE TWO VISITORS MAY VISIT WITH YOU IN YOUR PRIVATE ROOM DURING VISITING HOURS ONLY!!   COVID SWAB TESTING MUST BE COMPLETED ON:  Thursday, November 04, 2019 at 2:30PM [address] [facility] pre surgical testing line (Must self quarantine after testing. Follow instructions on handout.)             Your procedure is scheduled on: Monday, November 08, 2019   Report to [facility] Main  Entrance   Report to Short Stay at 5:45 AM   [facility])   Call this number if you have problems the morning of surgery [phone]   Do not eat food  :After Midnight.   May have liquids until 5:15 AM day of surgery   CLEAR LIQUID DIET  Foods Allowed                                                                     Foods Excluded  Water, Black Coffee and tea, regular and decaf                             liquids that you cannot  Plain Jell-O in any flavor  (No red)                                           see through such as: Fruit ices (not with fruit pulp)                                     milk, soups, orange juice  Iced Popsicles (No red)                                    All solid food Carbonated beverages, regular and diet                                    Apple juices Sports drinks like Gatorade (No red) Lightly seasoned clear broth or consume(fat free) Sugar, honey syrup  Sample Menu Breakfast                                Lunch                                     Supper Cranberry juice                    Beef broth  Chicken broth Jell-O                                     Grape juice                           Apple juice Coffee or tea                        Jell-O                                      Popsicle                                                Coffee or tea                         Coffee or tea   Complete one Ensure drink the morning of surgery at 5:15 AM the day of surgery.   Oral Hygiene is also important to reduce your risk of infection.                                    Remember - BRUSH YOUR TEETH THE MORNING OF SURGERY WITH YOUR REGULAR TOOTHPASTE   Do NOT smoke after Midnight   Take these medicines the morning of surgery with A SIP OF WATER: Atorvastatin, Metoprolol                               You may not have any metal on your body including jewelry, and body piercings             Do not wear lotions, powders, perfumes/cologne, or deodorant                          Men may shave face and neck.   Do not bring valuables to the hospital. [place].   Contacts, dentures or bridgework may not be worn into surgery.   Bring small overnight bag day of surgery.    Patients discharged the day of surgery will not be allowed to drive home.   Special Instructions: Bring a copy of your healthcare power of attorney and living will documents         the day of surgery if you haven't scanned them in before.              Please read over the following fact sheets you were given:  [facility] - Preparing for Surgery Before surgery, you can play an important role.  Because skin is not sterile, your skin needs to be as free of germs as possible.  You can reduce the number of germs on your skin by washing with CHG (chlorahexidine gluconate) soap before surgery.  CHG is an antiseptic cleaner which kills germs and bonds with the skin to  continue killing germs even after washing. Please DO NOT use if you have an allergy to CHG or antibacterial soaps.  If your skin becomes reddened/irritated stop using the CHG and inform your nurse when you arrive at Short Stay. Do not shave (including legs and underarms) for at least 48 hours prior to the first CHG shower.  You may shave your face/neck.  Please follow these instructions  carefully:  1.  Shower with CHG Soap the night before surgery and the  morning of surgery.  2.  If you choose to wash your hair, wash your hair first as usual with your normal  shampoo.  3.  After you shampoo, rinse your hair and body thoroughly to remove the shampoo.                             4.  Use CHG as you would any other liquid soap.  You can apply chg directly to the skin and wash.  Gently with a scrungie or clean washcloth.  5.  Apply the CHG Soap to your body ONLY FROM THE NECK DOWN.   Do   not use on face/ open                           Wound or open sores. Avoid contact with eyes, ears mouth and   genitals (private parts).                       Wash face,  Genitals (private parts) with your normal soap.             6.  Wash thoroughly, paying special attention to the area where your    surgery  will be performed.  7.  Thoroughly rinse your body with warm water from the neck down.  8.  DO NOT shower/wash with your normal soap after using and rinsing off the CHG Soap.                9.  Pat yourself dry with a clean towel.            10.  Wear clean pajamas.            11.  Place clean sheets on your bed the night of your first shower and do not  sleep with pets. Day of Surgery : Do not apply any lotions/deodorants the morning of surgery.  Please wear clean clothes to the hospital/surgery center.  FAILURE TO FOLLOW THESE INSTRUCTIONS MAY RESULT IN THE CANCELLATION OF YOUR SURGERY  PATIENT SIGNATURE_________________________________  NURSE SIGNATURE__________________________________  ________________________________________________________________________   Jason Lewis  An incentive spirometer is a tool that can help keep your lungs clear and active. This tool measures how well you are filling your lungs with each breath. Taking long deep breaths may help reverse or decrease the chance of developing breathing (pulmonary) problems (especially infection) following:  A  long period of time when you are unable to move or be active. BEFORE THE PROCEDURE   If the spirometer includes an indicator to show your best effort, your nurse or respiratory therapist will set it to a desired goal.  If possible, sit up straight or lean slightly forward. Try not to slouch.  Hold the incentive spirometer in an upright position. INSTRUCTIONS FOR USE  1. Sit on the edge of your bed if possible, or sit up as far  as you can in bed or on a chair. 2. Hold the incentive spirometer in an upright position. 3. Breathe out normally. 4. Place the mouthpiece in your mouth and seal your lips tightly around it. 5. Breathe in slowly and as deeply as possible, raising the piston or the ball toward the top of the column. 6. Hold your breath for 3-5 seconds or for as long as possible. Allow the piston or ball to fall to the bottom of the column. 7. Remove the mouthpiece from your mouth and breathe out normally. 8. Rest for a few seconds and repeat Steps 1 through 7 at least 10 times every 1-2 hours when you are awake. Take your time and take a few normal breaths between deep breaths. 9. The spirometer may include an indicator to show your best effort. Use the indicator as a goal to work toward during each repetition. 10. After each set of 10 deep breaths, practice coughing to be sure your lungs are clear. If you have an incision (the cut made at the time of surgery), support your incision when coughing by placing a pillow or rolled up towels firmly against it. Once you are able to get out of bed, walk around indoors and cough well. You may stop using the incentive spirometer when instructed by your caregiver.  RISKS AND COMPLICATIONS  Take your time so you do not get dizzy or light-headed.  If you are in pain, you may need to take or ask for pain medication before doing incentive spirometry. It is harder to take a deep breath if you are having pain. AFTER USE  Rest and breathe slowly and  easily.  It can be helpful to keep track of a log of your progress. Your caregiver can provide you with a simple table to help with this. If you are using the spirometer at home, follow these instructions: [place] IF:   You are having difficultly using the spirometer.  You have trouble using the spirometer as often as instructed.  Your pain medication is not giving enough relief while using the spirometer.  You develop fever of 100.5 F (38.1 C) or higher. SEEK IMMEDIATE MEDICAL CARE IF:   You cough up bloody sputum that had not been present before.  You develop fever of 102 F (38.9 C) or greater.  You develop worsening pain at or near the incision site. MAKE SURE YOU:   Understand these instructions.  Will watch your condition.  Will get help right away if you are not doing well or get worse. Document Released: 11/25/2006 Document Revised: 10/07/2011 Document Reviewed: 01/26/2007 ExitCare Patient Information 2014 ExitCare, [place].   ________________________________________________________________________  WHAT IS A BLOOD TRANSFUSION? Blood Transfusion Information  A transfusion is the replacement of blood or some of its parts. Blood is made up of multiple cells which provide different functions.  Red blood cells carry oxygen and are used for blood loss replacement.  White blood cells fight against infection.  Platelets control bleeding.  Plasma helps clot blood.  Other blood products are available for specialized needs, such as hemophilia or other clotting disorders. BEFORE THE TRANSFUSION  Who gives blood for transfusions?   Healthy volunteers who are fully evaluated to make sure their blood is safe. This is blood bank blood. Transfusion therapy is the safest it has ever been in the practice of medicine. Before blood is taken from a donor, a complete history is taken to make sure that person has no history of diseases  nor engages in risky social  behavior (examples are intravenous drug use or sexual activity with multiple partners). The donor's travel history is screened to minimize risk of transmitting infections, such as malaria. The donated blood is tested for signs of infectious diseases, such as HIV and hepatitis. The blood is then tested to be sure it is compatible with you in order to minimize the chance of a transfusion reaction. If you or a relative donates blood, this is often done in anticipation of surgery and is not appropriate for emergency situations. It takes many days to process the donated blood. RISKS AND COMPLICATIONS Although transfusion therapy is very safe and saves many lives, the main dangers of transfusion include:   Getting an infectious disease.  Developing a transfusion reaction. This is an allergic reaction to something in the blood you were given. Every precaution is taken to prevent this. The decision to have a blood transfusion has been considered carefully by your caregiver before blood is given. Blood is not given unless the benefits outweigh the risks. AFTER THE TRANSFUSION  Right after receiving a blood transfusion, you will usually feel much better and more energetic. This is especially true if your red blood cells have gotten low (anemic). The transfusion raises the level of the red blood cells which carry oxygen, and this usually causes an energy increase.  The nurse administering the transfusion will monitor you carefully for complications. HOME CARE INSTRUCTIONS  No special instructions are needed after a transfusion. You may find your energy is better. Speak with your caregiver about any limitations on activity for underlying diseases you may have. SEEK MEDICAL CARE IF:   Your condition is not improving after your transfusion.  You develop redness or irritation at the intravenous (IV) site. SEEK IMMEDIATE MEDICAL CARE IF:  Any of the following symptoms occur over the next 12 hours:  Shaking  chills.  You have a temperature by mouth above 102 F (38.9 C), not controlled by medicine.  Chest, back, or muscle pain.  People around you feel you are not acting correctly or are confused.  Shortness of breath or difficulty breathing.  Dizziness and fainting.  You get a rash or develop hives.  You have a decrease in urine output.  Your urine turns a dark color or changes to pink, red, or brown. Any of the following symptoms occur over the next 10 days:  You have a temperature by mouth above 102 F (38.9 C), not controlled by medicine.  Shortness of breath.  Weakness after normal activity.  The white part of the eye turns yellow (jaundice).  You have a decrease in the amount of urine or are urinating less often.  Your urine turns a dark color or changes to pink, red, or brown. Document Released: 07/12/2000 Document Revised: 10/07/2011 Document Reviewed: 02/29/2008 [facility] Patient Information 2014 [place], [place].  _______________________________________________________________________

## 2019-11-03 ENCOUNTER — Encounter (HOSPITAL_COMMUNITY): Payer: Self-pay

## 2019-11-03 ENCOUNTER — Other Ambulatory Visit: Payer: Self-pay

## 2019-11-03 ENCOUNTER — Encounter (HOSPITAL_COMMUNITY)
Admission: RE | Admit: 2019-11-03 | Discharge: 2019-11-03 | Disposition: A | Discharge status: Home / Self Care | Payer: PPO | Source: Ambulatory Visit | Attending: Orthopedic Surgery | Admitting: Orthopedic Surgery

## 2019-11-03 DIAGNOSIS — I10 Essential (primary) hypertension: Secondary | ICD-10-CM | POA: Insufficient documentation

## 2019-11-03 DIAGNOSIS — Z79899 Other long term (current) drug therapy: Secondary | ICD-10-CM | POA: Insufficient documentation

## 2019-11-03 DIAGNOSIS — E785 Hyperlipidemia, unspecified: Secondary | ICD-10-CM | POA: Insufficient documentation

## 2019-11-03 DIAGNOSIS — I2510 Atherosclerotic heart disease of native coronary artery without angina pectoris: Secondary | ICD-10-CM | POA: Insufficient documentation

## 2019-11-03 DIAGNOSIS — Z20822 Contact with and (suspected) exposure to covid-19: Secondary | ICD-10-CM | POA: Insufficient documentation

## 2019-11-03 DIAGNOSIS — Z01812 Encounter for preprocedural laboratory examination: Secondary | ICD-10-CM | POA: Insufficient documentation

## 2019-11-03 DIAGNOSIS — Z951 Presence of aortocoronary bypass graft: Secondary | ICD-10-CM | POA: Insufficient documentation

## 2019-11-03 DIAGNOSIS — Z7982 Long term (current) use of aspirin: Secondary | ICD-10-CM | POA: Insufficient documentation

## 2019-11-03 DIAGNOSIS — M1712 Unilateral primary osteoarthritis, left knee: Secondary | ICD-10-CM | POA: Insufficient documentation

## 2019-11-03 HISTORY — DX: Unspecified osteoarthritis, unspecified site: M19.90

## 2019-11-03 NOTE — Progress Notes (Signed)
Has completed COVID 19 vaccine series  PCP - Dr. Audie Pinto last office visit and clearance 10/12/19 in chart Cardiologist - Dt. Ashok Norris last office visit 09/14/19 in epic, Clearance B. Bhagat P.A. 10/04/19 in epic  Chest x-ray - greater than 1 year EKG - 09/14/19 in epic Stress Test - 10/12/18 in epic ECHO - greater then 2 years Cardiac Cath -  greater then 2 years  Sleep Study -  N/A CPAP - N/A  Fasting Blood Sugar - N/A Checks Blood Sugar __N/A___ times a day  Blood Thinner Instructions: N/A Aspirin Instructions: Yes Last Dose: 11/03/2019  Anesthesia review: MI. CABG, CAD, HTN  Patient denies shortness of breath, fever, cough and chest pain at PAT appointment   Patient verbalized understanding of instructions that were given to them at the PAT appointment. Patient was also instructed that they will need to review over the PAT instructions again at home before surgery.

## 2019-11-04 ENCOUNTER — Other Ambulatory Visit (HOSPITAL_COMMUNITY)
Admission: RE | Admit: 2019-11-04 | Discharge: 2019-11-04 | Disposition: A | Discharge status: Home / Self Care | Payer: PPO | Source: Ambulatory Visit | Attending: Orthopedic Surgery | Admitting: Orthopedic Surgery

## 2019-11-04 ENCOUNTER — Encounter (HOSPITAL_COMMUNITY)
Admission: RE | Admit: 2019-11-04 | Discharge: 2019-11-04 | Disposition: A | Discharge status: Home / Self Care | Payer: PPO | Source: Ambulatory Visit | Attending: Orthopedic Surgery | Admitting: Orthopedic Surgery

## 2019-11-04 DIAGNOSIS — Z01812 Encounter for preprocedural laboratory examination: Secondary | ICD-10-CM | POA: Diagnosis not present

## 2019-11-04 DIAGNOSIS — I10 Essential (primary) hypertension: Secondary | ICD-10-CM | POA: Diagnosis not present

## 2019-11-04 DIAGNOSIS — M1712 Unilateral primary osteoarthritis, left knee: Secondary | ICD-10-CM | POA: Diagnosis not present

## 2019-11-04 DIAGNOSIS — Z7982 Long term (current) use of aspirin: Secondary | ICD-10-CM | POA: Diagnosis not present

## 2019-11-04 DIAGNOSIS — Z79899 Other long term (current) drug therapy: Secondary | ICD-10-CM | POA: Diagnosis not present

## 2019-11-04 DIAGNOSIS — I2510 Atherosclerotic heart disease of native coronary artery without angina pectoris: Secondary | ICD-10-CM | POA: Diagnosis not present

## 2019-11-04 DIAGNOSIS — E785 Hyperlipidemia, unspecified: Secondary | ICD-10-CM | POA: Diagnosis not present

## 2019-11-04 DIAGNOSIS — Z951 Presence of aortocoronary bypass graft: Secondary | ICD-10-CM | POA: Diagnosis not present

## 2019-11-04 DIAGNOSIS — Z20822 Contact with and (suspected) exposure to covid-19: Secondary | ICD-10-CM | POA: Diagnosis not present

## 2019-11-04 LAB — COMPREHENSIVE METABOLIC PANEL
ALT: 22 U/L (ref 0–44)
AST: 23 U/L (ref 15–41)
Albumin: 4.3 g/dL (ref 3.5–5.0)
Alkaline Phosphatase: 66 U/L (ref 38–126)
Anion gap: 6 (ref 5–15)
BUN: 23 mg/dL (ref 8–23)
CO2: 26 mmol/L (ref 22–32)
Calcium: 9.6 mg/dL (ref 8.9–10.3)
Chloride: 112 mmol/L — ABNORMAL HIGH (ref 98–111)
Creatinine, Ser: 1.06 mg/dL (ref 0.61–1.24)
GFR calc Af Amer: >60 mL/min (ref >60)
GFR calc non Af Amer: >60 mL/min (ref >60)
Glucose, Bld: 108 mg/dL — ABNORMAL HIGH (ref 70–99)
Potassium: 4 mmol/L (ref 3.5–5.1)
Sodium: 144 mmol/L (ref 135–145)
Total Bilirubin: 0.7 mg/dL (ref 0.3–1.2)
Total Protein: 6.8 g/dL (ref 6.5–8.1)

## 2019-11-04 LAB — CBC
HCT: 39 % (ref 39.0–52.0)
Hemoglobin: 13.4 g/dL (ref 13.0–17.0)
MCH: 32.1 pg (ref 26.0–34.0)
MCHC: 34.4 g/dL (ref 30.0–36.0)
MCV: 93.3 fL (ref 80.0–100.0)
Platelets: 271 10*3/uL (ref 150–400)
RBC: 4.18 MIL/uL — ABNORMAL LOW (ref 4.22–5.81)
RDW: 11.8 % (ref 11.5–15.5)
WBC: 7.6 10*3/uL (ref 4.0–10.5)
nRBC: 0 % (ref 0.0–0.2)

## 2019-11-04 LAB — APTT: aPTT: 30 seconds (ref 24–36)

## 2019-11-04 LAB — PROTIME-INR
INR: 1 (ref 0.8–1.2)
Prothrombin Time: 13.2 seconds (ref 11.4–15.2)

## 2019-11-04 LAB — SURGICAL PCR SCREEN
MRSA, PCR: NEGATIVE
Staphylococcus aureus: NEGATIVE

## 2019-11-04 LAB — SARS CORONAVIRUS 2 (TAT 6-24 HRS): SARS Coronavirus 2: NEGATIVE

## 2019-11-05 LAB — ABO/RH: ABO/RH(D): O POS

## 2019-11-05 NOTE — Progress Notes (Signed)
Pt aware to arrive at [facility] admitting at [facility] 11/08/2019.

## 2019-11-05 NOTE — Progress Notes (Signed)
Anesthesia Chart Review   Case: [id number] Date/Time: 11/08/19 0805   Procedure: TOTAL KNEE ARTHROPLASTY (Left Knee) - 51min   Anesthesia type: Choice   Pre-op diagnosis: Left knee osteoarthritis   Location: [facility] ROOM 10 / [facility]   Surgeons: Gaynelle Arabian, MD      DISCUSSION:68 y.o. never smoker with h/o HLD, CAD (CABG x 4 2018), left knee OA scheduled for above procedure 11/08/19 with Dr. Gaynelle Arabian.   Clearance received from cardiology.  Per Leanor Kail, PA, "Hx of CAD status post NSTEMI followed by CABG x4 with LIMA to the LAD, SVG to the ramus, SVG to the diagonal 2, SVG to PDA 05/19/17. Nuclear stress test 09/2018  was normal.   Per Dr. Radford Pax 09/14/19 "I think he is stable from a cardiac standpoint for knee surgery and his Revised cardiac risk index is low at 0.9% for major cardiac event in the periop period. This is considered an acceptable risk to proceed with surgery"."  Anticipate pt can proceed with planned procedure barring acute status change.   VS: BP (!) 120/55   Pulse (!) 57   Temp 36.9 C (Oral)   Resp 16   Ht 5\' 8"  (1.727 m)   Wt 81.2 kg   SpO2 98%   BMI 27.22 kg/m   PROVIDERS: Deland Pretty, MD is PCP   Fransico Him, MD is Cardiologist  LABS: Labs reviewed: Acceptable for surgery. (all labs ordered are listed, but only abnormal results are displayed)  Labs Reviewed  CBC - Abnormal; Notable for the following components:      Result Value   RBC 4.18 (*)    All other components within normal limits  COMPREHENSIVE METABOLIC PANEL - Abnormal; Notable for the following components:   Chloride 112 (*)    Glucose, Bld 108 (*)    All other components within normal limits  SURGICAL PCR SCREEN  APTT  PROTIME-INR  TYPE AND SCREEN  ABO/RH     IMAGES:   EKG: 09/14/2019 Rate 58 bpm Sinus bradycardia RBBB  CV: Stress Test 10/12/2018  Nuclear stress EF: 55%.  Blood pressure demonstrated a normal response to exercise.  There was no ST segment  deviation noted during stress.  The study is normal.  This is a low risk study.  The left ventricular ejection fraction is normal (55-65%).   Normal resting and stress perfusion. No ischemia or infarction EF 55%  Cardiac Cath 05/13/2017  Mid RCA lesion, 60 %stenosed.    Non-ST elevation myocardial infarction presentation without definite culprit identified.  Widely patent left main.  Heavily calcified and diffusely diseased mid LAD and second diagonal containing 80 and 70% stenoses respectively, forming a [place] 0, 1,1 bifurcation stenosis. A small third diagonal contains 80%proximal narrowing.  Significant circumflex disease with 90-95% stenosis in the moderate sized first obtuse marginal and 70% obstruction in the continuation of the circumflex beyond the origin of the 1st marginal branch.  The RCA contains mid-vessel intraluminal thrombus with obstruction up to 50% and mid PDA 75% obstruction. Distal left ventricular branch 60-70% narrowing.  Overall normal LV function. EF 60%. LVEDP is normal, with no evidence of diastolic heart failure.  RECOMMENDATIONS:   Heart team approach: Unclear culprit for presentation, likely the mid RCA which contains thrombus and </= 50% stenosis and otherwise has TIMI-3 flow. The other possibility is the first obtuse marginal branch which is part of a relatively complex bifurcation stenosis involving the circumflex. Additionally, there is high-grade calcified stenosis in the mid LAD  and diagonal #2 territory. When all is considered, surgical therapy may be his best option. Further discussion needed. Each PCI option is relatively complex. Past Medical History:  Diagnosis Date  . Arthritis   . CAD in native artery    a.  CABG x 4 utilizing LIMA ot LAD, SVG to Ramus, SVG to Diagonal #2, and SVG to PDA on 05/19/17.  . Chest pain 05/12/2017  . 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 Hyperglycemia    a. A1C 5.7 in 04/2017.  Marland Kitchen Hyperlipidemia   . Medical history  non-contributory   . Non-ST elevation (NSTEMI) myocardial infarction [facility])     Past Surgical History:  Procedure Laterality Date  . CARDIAC CATHETERIZATION  05/13/2017   Dr Linard Millers 111  . COLONOSCOPY    . CORONARY ARTERY BYPASS GRAFT N/A 05/19/2017   Procedure: CORONARY ARTERY BYPASS GRAFTING (CABG) x 4;  Surgeon: Ivin Poot, MD;  Location: [facility];  Service: Open Heart Surgery;  Laterality: N/A;  . HERNIA REPAIR    . KNEE ARTHROSCOPY WITH SUBCHONDROPLASTY Left 01/13/2019   Procedure: Left knee arthroscopic partial medial and lateral meniscectomies with medial tibial and medial femoral condyle subchondroplasty;  Surgeon: [name], MD;  Location[facility];  Service: Orthopedics;  Laterality: Left;  . KNEE SURGERY Left 2005  . LEFT HEART CATH AND CORONARY ANGIOGRAPHY N/A 05/13/2017   Procedure: LEFT HEART CATH AND CORONARY ANGIOGRAPHY;  Surgeon: Belva Crome, MD;  Location: [place] CV LAB;  Service: Cardiovascular;  Laterality: N/A;  . TEE WITHOUT CARDIOVERSION N/A 05/19/2017   Procedure: TRANSESOPHAGEAL ECHOCARDIOGRAM (TEE);  Surgeon: Prescott Gum, Collier Salina, MD;  Location: [facility];  Service: Open Heart Surgery;  Laterality: N/A;  . TONSILLECTOMY    . ULTRASOUND GUIDANCE FOR VASCULAR ACCESS  05/13/2017   Procedure: Ultrasound Guidance For Vascular Access;  Surgeon: Belva Crome, MD;  Location: [place] [facility];  Service: Cardiovascular;;    MEDICATIONS: . aspirin EC 81 MG EC tablet  . atorvastatin (LIPITOR) 40 MG tablet  . metoprolol succinate (TOPROL-XL) 25 MG 24 hr tablet   No current facility-administered medications for this encounter.    [facility] Pre-Surgical Testing [phone] 11/05/19  10:33 AM

## 2019-11-07 MED ORDER — BUPIVACAINE LIPOSOME 1.3 % IJ SUSP
20.0000 mL | INTRAMUSCULAR | Status: DC
  Filled 2019-11-07: qty 20

## 2019-11-07 NOTE — Anesthesia Preprocedure Evaluation (Addendum)
Anesthesia Evaluation    Reviewed: Allergy & Precautions, Patient's Chart, lab work & pertinent test results  Airway Mallampati: II  TM Distance: >3 FB Neck ROM: Full    Dental  (+) Teeth Intact, Dental Advisory Given   Pulmonary neg pulmonary ROS,    Pulmonary exam normal breath sounds clear to auscultation       Cardiovascular hypertension, Pt. on home beta blockers + CAD, + Past MI and + CABG  Normal cardiovascular exam Rhythm:Regular Rate:Normal     Neuro/Psych negative neurological ROS     GI/Hepatic negative GI ROS, Neg liver ROS,   Endo/Other  negative endocrine ROS  Renal/GU negative Renal ROS     Musculoskeletal  (+) Arthritis ,   Abdominal   Peds  Hematology negative hematology ROS (+) Plt 271k   Anesthesia Other Findings Day of surgery medications reviewed with the patient.  Reproductive/Obstetrics                            Anesthesia Physical Anesthesia Plan  ASA: III  Anesthesia Plan: Spinal   Post-op Pain Management:  Regional for Post-op pain   Induction: Intravenous  PONV Risk Score and Plan: 1 and Propofol infusion and Dexamethasone  Airway Management Planned: Natural Airway and Nasal Cannula  Additional Equipment:   Intra-op Plan:   Post-operative Plan:   Informed Consent: I have reviewed the patients History and Physical, chart, labs and discussed the procedure including the risks, benefits and alternatives for the proposed anesthesia with the patient or authorized representative who has indicated his/her understanding and acceptance.     Dental advisory given  Plan Discussed with: CRNA  Anesthesia Plan Comments:        Anesthesia Quick Evaluation

## 2019-11-08 ENCOUNTER — Other Ambulatory Visit: Payer: Self-pay

## 2019-11-08 ENCOUNTER — Ambulatory Visit (HOSPITAL_COMMUNITY): Payer: PPO | Admitting: Physician Assistant

## 2019-11-08 ENCOUNTER — Ambulatory Visit (HOSPITAL_COMMUNITY)
Admission: RE | Admit: 2019-11-08 | Discharge: 2019-11-09 | Disposition: A | Discharge status: Home / Self Care | Payer: PPO | Attending: Orthopedic Surgery | Admitting: Orthopedic Surgery

## 2019-11-08 ENCOUNTER — Encounter (HOSPITAL_COMMUNITY): Payer: Self-pay | Admitting: Orthopedic Surgery

## 2019-11-08 ENCOUNTER — Ambulatory Visit (HOSPITAL_COMMUNITY): Payer: PPO | Admitting: Anesthesiology

## 2019-11-08 ENCOUNTER — Encounter (HOSPITAL_COMMUNITY)
Admission: RE | Disposition: A | Discharge status: Home / Self Care | Payer: Self-pay | Source: Home / Self Care | Attending: Orthopedic Surgery

## 2019-11-08 DIAGNOSIS — Z7982 Long term (current) use of aspirin: Secondary | ICD-10-CM | POA: Diagnosis not present

## 2019-11-08 DIAGNOSIS — E785 Hyperlipidemia, unspecified: Secondary | ICD-10-CM | POA: Insufficient documentation

## 2019-11-08 DIAGNOSIS — Z951 Presence of aortocoronary bypass graft: Secondary | ICD-10-CM | POA: Diagnosis not present

## 2019-11-08 DIAGNOSIS — M1710 Unilateral primary osteoarthritis, unspecified knee: Secondary | ICD-10-CM | POA: Diagnosis present

## 2019-11-08 DIAGNOSIS — I2510 Atherosclerotic heart disease of native coronary artery without angina pectoris: Secondary | ICD-10-CM | POA: Insufficient documentation

## 2019-11-08 DIAGNOSIS — M1712 Unilateral primary osteoarthritis, left knee: Secondary | ICD-10-CM | POA: Diagnosis not present

## 2019-11-08 DIAGNOSIS — Z6827 Body mass index (BMI) 27.0-27.9, adult: Secondary | ICD-10-CM | POA: Insufficient documentation

## 2019-11-08 DIAGNOSIS — I252 Old myocardial infarction: Secondary | ICD-10-CM | POA: Insufficient documentation

## 2019-11-08 DIAGNOSIS — I10 Essential (primary) hypertension: Secondary | ICD-10-CM | POA: Diagnosis not present

## 2019-11-08 DIAGNOSIS — G8918 Other acute postprocedural pain: Secondary | ICD-10-CM | POA: Diagnosis not present

## 2019-11-08 DIAGNOSIS — E669 Obesity, unspecified: Secondary | ICD-10-CM | POA: Insufficient documentation

## 2019-11-08 DIAGNOSIS — M179 Osteoarthritis of knee, unspecified: Secondary | ICD-10-CM | POA: Diagnosis present

## 2019-11-08 HISTORY — PX: TOTAL KNEE ARTHROPLASTY: SHX125

## 2019-11-08 LAB — TYPE AND SCREEN
ABO/RH(D): O POS
Antibody Screen: NEGATIVE

## 2019-11-08 SURGERY — ARTHROPLASTY, KNEE, TOTAL
Anesthesia: Spinal | Site: Knee | Laterality: Left

## 2019-11-08 MED ORDER — CLONIDINE HCL (ANALGESIA) 100 MCG/ML EP SOLN
EPIDURAL | Status: DC | PRN
  Administered 2019-11-08: 100 ug

## 2019-11-08 MED ORDER — FLEET ENEMA 7-19 GM/118ML RE ENEM: 1.0000 | ENEMA | Freq: Once | RECTAL | Status: DC | PRN

## 2019-11-08 MED ORDER — PROPOFOL 500 MG/50ML IV EMUL
INTRAVENOUS | Status: DC | PRN
  Administered 2019-11-08: 75 ug/kg/min via INTRAVENOUS

## 2019-11-08 MED ORDER — SODIUM CHLORIDE 0.9 % IR SOLN
Status: DC | PRN
  Administered 2019-11-08: 1000 mL

## 2019-11-08 MED ORDER — METOPROLOL SUCCINATE ER 25 MG PO TB24
25.0000 mg | ORAL_TABLET | Freq: Every day | ORAL | Status: DC
  Administered 2019-11-09: 25 mg via ORAL
  Filled 2019-11-08: qty 1

## 2019-11-08 MED ORDER — DIPHENHYDRAMINE HCL 12.5 MG/5ML PO ELIX: 12.5000 mg | ORAL_SOLUTION | ORAL | Status: DC | PRN

## 2019-11-08 MED ORDER — DEXAMETHASONE SODIUM PHOSPHATE 10 MG/ML IJ SOLN: 8.0000 mg | Freq: Once | INTRAMUSCULAR | Status: DC

## 2019-11-08 MED ORDER — ATORVASTATIN CALCIUM 40 MG PO TABS
40.0000 mg | ORAL_TABLET | Freq: Every day | ORAL | Status: DC
  Administered 2019-11-09: 40 mg via ORAL
  Filled 2019-11-08: qty 1

## 2019-11-08 MED ORDER — FENTANYL CITRATE (PF) 250 MCG/5ML IJ SOLN
INTRAMUSCULAR | Status: AC
  Filled 2019-11-08: qty 5

## 2019-11-08 MED ORDER — METHOCARBAMOL 500 MG PO TABS
500.0000 mg | ORAL_TABLET | Freq: Four times a day (QID) | ORAL | Status: DC | PRN
  Administered 2019-11-09: 500 mg via ORAL
  Filled 2019-11-08: qty 1

## 2019-11-08 MED ORDER — POVIDONE-IODINE 10 % EX SWAB
2.0000 "application " | Freq: Once | CUTANEOUS | Status: AC
  Administered 2019-11-08: 2 via TOPICAL

## 2019-11-08 MED ORDER — PHENOL 1.4 % MT LIQD: 1.0000 | OROMUCOSAL | Status: DC | PRN

## 2019-11-08 MED ORDER — MORPHINE SULFATE (PF) 2 MG/ML IV SOLN: 0.5000 mg | INTRAVENOUS | Status: DC | PRN

## 2019-11-08 MED ORDER — ONDANSETRON HCL 4 MG PO TABS: 4.0000 mg | ORAL_TABLET | Freq: Four times a day (QID) | ORAL | Status: DC | PRN

## 2019-11-08 MED ORDER — TRANEXAMIC ACID-NACL 1000-0.7 MG/100ML-% IV SOLN
1000.0000 mg | INTRAVENOUS | Status: AC
  Administered 2019-11-08: 1000 mg via INTRAVENOUS
  Filled 2019-11-08: qty 100

## 2019-11-08 MED ORDER — STERILE WATER FOR IRRIGATION IR SOLN
Status: DC | PRN
  Administered 2019-11-08 (×2): 1000 mL

## 2019-11-08 MED ORDER — METHOCARBAMOL 500 MG IVPB - SIMPLE MED
INTRAVENOUS | Status: AC
  Filled 2019-11-08: qty 50

## 2019-11-08 MED ORDER — FENTANYL CITRATE (PF) 250 MCG/5ML IJ SOLN
INTRAMUSCULAR | Status: DC | PRN
  Administered 2019-11-08 (×3): 50 ug via INTRAVENOUS

## 2019-11-08 MED ORDER — FENTANYL CITRATE (PF) 100 MCG/2ML IJ SOLN
50.0000 ug | INTRAMUSCULAR | Status: DC
  Administered 2019-11-08: 50 ug via INTRAVENOUS
  Filled 2019-11-08: qty 2

## 2019-11-08 MED ORDER — LIDOCAINE 2% (20 MG/ML) 5 ML SYRINGE
INTRAMUSCULAR | Status: DC | PRN
  Administered 2019-11-08: 40 mg via INTRAVENOUS

## 2019-11-08 MED ORDER — ONDANSETRON HCL 4 MG/2ML IJ SOLN
INTRAMUSCULAR | Status: DC | PRN
  Administered 2019-11-08: 4 mg via INTRAVENOUS

## 2019-11-08 MED ORDER — MIDAZOLAM HCL 2 MG/2ML IJ SOLN
INTRAMUSCULAR | Status: AC
  Filled 2019-11-08: qty 2

## 2019-11-08 MED ORDER — FENTANYL CITRATE (PF) 100 MCG/2ML IJ SOLN: 25.0000 ug | INTRAMUSCULAR | Status: DC | PRN

## 2019-11-08 MED ORDER — BUPIVACAINE LIPOSOME 1.3 % IJ SUSP
INTRAMUSCULAR | Status: DC | PRN
  Administered 2019-11-08: 20 mL

## 2019-11-08 MED ORDER — SODIUM CHLORIDE (PF) 0.9 % IJ SOLN
INTRAMUSCULAR | Status: DC | PRN
  Administered 2019-11-08: 60 mL via INTRAVENOUS

## 2019-11-08 MED ORDER — DOCUSATE SODIUM 100 MG PO CAPS
100.0000 mg | ORAL_CAPSULE | Freq: Two times a day (BID) | ORAL | Status: DC
  Administered 2019-11-08 – 2019-11-09 (×2): 100 mg via ORAL
  Filled 2019-11-08 (×2): qty 1

## 2019-11-08 MED ORDER — DEXAMETHASONE SODIUM PHOSPHATE 10 MG/ML IJ SOLN
10.0000 mg | Freq: Once | INTRAMUSCULAR | Status: AC
  Administered 2019-11-09: 10 mg via INTRAVENOUS
  Filled 2019-11-08: qty 1

## 2019-11-08 MED ORDER — MENTHOL 3 MG MT LOZG: 1.0000 | LOZENGE | OROMUCOSAL | Status: DC | PRN

## 2019-11-08 MED ORDER — CEFAZOLIN SODIUM-DEXTROSE 2-4 GM/100ML-% IV SOLN
2.0000 g | INTRAVENOUS | Status: AC
  Administered 2019-11-08: 2 g via INTRAVENOUS
  Filled 2019-11-08: qty 100

## 2019-11-08 MED ORDER — CEFAZOLIN SODIUM-DEXTROSE 2-4 GM/100ML-% IV SOLN
2.0000 g | Freq: Four times a day (QID) | INTRAVENOUS | Status: AC
  Administered 2019-11-08 (×2): 2 g via INTRAVENOUS
  Filled 2019-11-08 (×2): qty 100

## 2019-11-08 MED ORDER — METHOCARBAMOL 500 MG IVPB - SIMPLE MED
500.0000 mg | Freq: Four times a day (QID) | INTRAVENOUS | Status: DC | PRN
  Administered 2019-11-08: 500 mg via INTRAVENOUS
  Filled 2019-11-08: qty 50

## 2019-11-08 MED ORDER — PHENYLEPHRINE HCL-NACL 10-0.9 MG/250ML-% IV SOLN
INTRAVENOUS | Status: DC | PRN
  Administered 2019-11-08: 45 ug/min via INTRAVENOUS

## 2019-11-08 MED ORDER — RIVAROXABAN 10 MG PO TABS
10.0000 mg | ORAL_TABLET | Freq: Every day | ORAL | Status: DC
  Administered 2019-11-09: 10 mg via ORAL
  Filled 2019-11-08: qty 1

## 2019-11-08 MED ORDER — EPHEDRINE SULFATE-NACL 50-0.9 MG/10ML-% IV SOSY
PREFILLED_SYRINGE | INTRAVENOUS | Status: DC | PRN
  Administered 2019-11-08 (×2): 10 mg via INTRAVENOUS
  Administered 2019-11-08: 5 mg via INTRAVENOUS

## 2019-11-08 MED ORDER — ONDANSETRON HCL 4 MG/2ML IJ SOLN: 4.0000 mg | Freq: Four times a day (QID) | INTRAMUSCULAR | Status: DC | PRN

## 2019-11-08 MED ORDER — LACTATED RINGERS IV SOLN: INTRAVENOUS | Status: DC

## 2019-11-08 MED ORDER — POLYETHYLENE GLYCOL 3350 17 G PO PACK: 17.0000 g | PACK | Freq: Every day | ORAL | Status: DC | PRN

## 2019-11-08 MED ORDER — SODIUM CHLORIDE 0.9 % IV SOLN: INTRAVENOUS | Status: DC

## 2019-11-08 MED ORDER — ACETAMINOPHEN 10 MG/ML IV SOLN
1000.0000 mg | Freq: Four times a day (QID) | INTRAVENOUS | Status: DC
  Administered 2019-11-08: 1000 mg via INTRAVENOUS
  Filled 2019-11-08: qty 100

## 2019-11-08 MED ORDER — ACETAMINOPHEN 500 MG PO TABS
1000.0000 mg | ORAL_TABLET | Freq: Four times a day (QID) | ORAL | Status: DC
  Administered 2019-11-08 – 2019-11-09 (×3): 1000 mg via ORAL
  Filled 2019-11-08 (×3): qty 2

## 2019-11-08 MED ORDER — PROPOFOL 10 MG/ML IV BOLUS
INTRAVENOUS | Status: AC
  Filled 2019-11-08: qty 20

## 2019-11-08 MED ORDER — BISACODYL 10 MG RE SUPP: 10.0000 mg | Freq: Every day | RECTAL | Status: DC | PRN

## 2019-11-08 MED ORDER — PROPOFOL 10 MG/ML IV BOLUS
INTRAVENOUS | Status: DC | PRN
  Administered 2019-11-08: 20 mg via INTRAVENOUS

## 2019-11-08 MED ORDER — SODIUM CHLORIDE (PF) 0.9 % IJ SOLN
INTRAMUSCULAR | Status: AC
  Filled 2019-11-08: qty 50

## 2019-11-08 MED ORDER — GABAPENTIN 300 MG PO CAPS
300.0000 mg | ORAL_CAPSULE | Freq: Three times a day (TID) | ORAL | Status: DC
  Administered 2019-11-08 – 2019-11-09 (×3): 300 mg via ORAL
  Filled 2019-11-08 (×3): qty 1

## 2019-11-08 MED ORDER — DEXAMETHASONE SODIUM PHOSPHATE 10 MG/ML IJ SOLN
INTRAMUSCULAR | Status: DC | PRN
  Administered 2019-11-08: 10 mg via INTRAVENOUS

## 2019-11-08 MED ORDER — METOCLOPRAMIDE HCL 5 MG PO TABS: 5.0000 mg | ORAL_TABLET | Freq: Three times a day (TID) | ORAL | Status: DC | PRN

## 2019-11-08 MED ORDER — TRAMADOL HCL 50 MG PO TABS
50.0000 mg | ORAL_TABLET | Freq: Four times a day (QID) | ORAL | Status: DC | PRN
  Administered 2019-11-09 (×2): 50 mg via ORAL
  Filled 2019-11-08 (×2): qty 1

## 2019-11-08 MED ORDER — ONDANSETRON HCL 4 MG/2ML IJ SOLN: 4.0000 mg | Freq: Once | INTRAMUSCULAR | Status: DC | PRN

## 2019-11-08 MED ORDER — BUPIVACAINE IN DEXTROSE 0.75-8.25 % IT SOLN
INTRATHECAL | Status: DC | PRN
  Administered 2019-11-08: 1.6 mL via INTRATHECAL

## 2019-11-08 MED ORDER — OXYCODONE HCL 5 MG PO TABS: 5.0000 mg | ORAL_TABLET | ORAL | Status: DC | PRN

## 2019-11-08 MED ORDER — MIDAZOLAM HCL 2 MG/2ML IJ SOLN
1.0000 mg | INTRAMUSCULAR | Status: DC
  Administered 2019-11-08: 2 mg via INTRAVENOUS
  Filled 2019-11-08: qty 2

## 2019-11-08 MED ORDER — CHLORHEXIDINE GLUCONATE 4 % EX LIQD: 60.0000 mL | Freq: Once | CUTANEOUS | Status: DC

## 2019-11-08 MED ORDER — ROPIVACAINE HCL 7.5 MG/ML IJ SOLN
INTRAMUSCULAR | Status: DC | PRN
  Administered 2019-11-08: 20 mL via PERINEURAL

## 2019-11-08 MED ORDER — METOCLOPRAMIDE HCL 5 MG/ML IJ SOLN: 5.0000 mg | Freq: Three times a day (TID) | INTRAMUSCULAR | Status: DC | PRN

## 2019-11-08 MED ORDER — SODIUM CHLORIDE (PF) 0.9 % IJ SOLN
INTRAMUSCULAR | Status: AC
  Filled 2019-11-08: qty 10

## 2019-11-08 MED ORDER — MIDAZOLAM HCL 2 MG/2ML IJ SOLN
INTRAMUSCULAR | Status: DC | PRN
  Administered 2019-11-08 (×2): 1 mg via INTRAVENOUS

## 2019-11-08 SURGICAL SUPPLY — 61 items
ATTUNE MED DOME PAT 38 KNEE (Knees) ×1 IMPLANT
ATTUNE MED DOME PAT 38MM KNEE (Knees) ×1 IMPLANT
ATTUNE PS FEM LT SZ 6 CEM KNEE (Femur) ×2 IMPLANT
ATTUNE PSRP INSR SZ6 6 KNEE (Insert) ×1 IMPLANT
ATTUNE PSRP INSR SZ6 6MM KNEE (Insert) ×1 IMPLANT
BAG ZIPLOCK 12X15 (MISCELLANEOUS) ×3 IMPLANT
BASE TIBIA ATTUNE KNEE SYS SZ6 (Knees) IMPLANT
BLADE SAG 18X100X1.27 (BLADE) ×3 IMPLANT
BLADE SAW SGTL 11.0X1.19X90.0M (BLADE) ×3 IMPLANT
BLADE SURG SZ10 CARB STEEL (BLADE) ×6 IMPLANT
BNDG ELASTIC 6X5.8 VLCR STR LF (GAUZE/BANDAGES/DRESSINGS) ×3 IMPLANT
BOWL SMART MIX CTS (DISPOSABLE) ×3 IMPLANT
CEMENT HV SMART SET (Cement) ×6 IMPLANT
CLOSURE STERI-STRIP 1/2X4 (GAUZE/BANDAGES/DRESSINGS) ×1
CLOSURE WOUND 1/2 X4 (GAUZE/BANDAGES/DRESSINGS) ×2
CLSR STERI-STRIP ANTIMIC 1/2X4 (GAUZE/BANDAGES/DRESSINGS) ×1 IMPLANT
COVER SURGICAL LIGHT HANDLE (MISCELLANEOUS) ×3 IMPLANT
COVER WAND RF STERILE (DRAPES) ×2 IMPLANT
CUFF TOURN SGL QUICK 34 (TOURNIQUET CUFF) ×3
CUFF TRNQT CYL 34X4.125X (TOURNIQUET CUFF) ×1 IMPLANT
DECANTER SPIKE VIAL GLASS SM (MISCELLANEOUS) ×3 IMPLANT
DRAPE U-SHAPE 47X51 STRL (DRAPES) ×3 IMPLANT
DRSG AQUACEL AG ADV 3.5X10 (GAUZE/BANDAGES/DRESSINGS) ×3 IMPLANT
DURAPREP 26ML APPLICATOR (WOUND CARE) ×3 IMPLANT
ELECT REM PT RETURN 15FT ADLT (MISCELLANEOUS) ×3 IMPLANT
EVACUATOR 1/8 PVC DRAIN (DRAIN) ×2 IMPLANT
GAUZE SPONGE 2X2 8PLY STRL LF (GAUZE/BANDAGES/DRESSINGS) ×1 IMPLANT
GLOVE BIO SURGEON STRL SZ7 (GLOVE) ×3 IMPLANT
GLOVE BIO SURGEON STRL SZ8 (GLOVE) ×3 IMPLANT
GLOVE BIOGEL PI IND STRL 7.0 (GLOVE) ×1 IMPLANT
GLOVE BIOGEL PI IND STRL 8 (GLOVE) ×1 IMPLANT
GLOVE BIOGEL PI INDICATOR 7.0 (GLOVE) ×2
GLOVE BIOGEL PI INDICATOR 8 (GLOVE) ×2
GOWN STRL REUS W/TWL LRG LVL3 (GOWN DISPOSABLE) ×6 IMPLANT
HANDPIECE INTERPULSE COAX TIP (DISPOSABLE) ×3
HOLDER FOLEY CATH W/STRAP (MISCELLANEOUS) ×2 IMPLANT
IMMOBILIZER KNEE 20 (SOFTGOODS) ×3
IMMOBILIZER KNEE 20 THIGH 36 (SOFTGOODS) ×1 IMPLANT
KIT TURNOVER KIT A (KITS) IMPLANT
MANIFOLD NEPTUNE II (INSTRUMENTS) ×3 IMPLANT
NS IRRIG 1000ML POUR BTL (IV SOLUTION) ×3 IMPLANT
PACK TOTAL KNEE CUSTOM (KITS) ×3 IMPLANT
PADDING CAST COTTON 6X4 STRL (CAST SUPPLIES) ×4 IMPLANT
PENCIL SMOKE EVACUATOR (MISCELLANEOUS) ×2 IMPLANT
PIN DRILL FIX HALF THREAD (BIT) ×2 IMPLANT
PIN STEINMAN FIXATION KNEE (PIN) ×2 IMPLANT
PROTECTOR NERVE ULNAR (MISCELLANEOUS) ×3 IMPLANT
SET HNDPC FAN SPRY TIP SCT (DISPOSABLE) ×1 IMPLANT
SPONGE GAUZE 2X2 STER 10/PKG (GAUZE/BANDAGES/DRESSINGS) ×2
STRIP CLOSURE SKIN 1/2X4 (GAUZE/BANDAGES/DRESSINGS) ×4 IMPLANT
SUT MNCRL AB 4-0 PS2 18 (SUTURE) ×3 IMPLANT
SUT STRATAFIX 0 PDS 27 VIOLET (SUTURE) ×3
SUT VIC AB 2-0 CT1 27 (SUTURE) ×9
SUT VIC AB 2-0 CT1 TAPERPNT 27 (SUTURE) ×3 IMPLANT
SUTURE STRATFX 0 PDS 27 VIOLET (SUTURE) ×1 IMPLANT
TIBIA ATTUNE KNEE SYS BASE SZ6 (Knees) ×3 IMPLANT
TRAY FOLEY MTR SLVR 16FR STAT (SET/KITS/TRAYS/PACK) ×3 IMPLANT
URINEMETER 200ML W/220 (MISCELLANEOUS) ×2 IMPLANT
WATER STERILE IRR 1000ML POUR (IV SOLUTION) ×6 IMPLANT
WRAP KNEE MAXI GEL POST OP (GAUZE/BANDAGES/DRESSINGS) ×3 IMPLANT
YANKAUER SUCT BULB TIP 10FT TU (MISCELLANEOUS) ×3 IMPLANT

## 2019-11-08 NOTE — Interval H&P Note (Signed)
History and Physical Interval Note:  11/08/2019 6:48 AM  Jason Lewis  has presented today for surgery, with the diagnosis of Left knee osteoarthritis.  The various methods of treatment have been discussed with the patient and family. After consideration of risks, benefits and other options for treatment, the patient has consented to  Procedure(s) with comments: TOTAL KNEE ARTHROPLASTY (Left) - 50min as a surgical intervention.  [name] history has been reviewed, patient examined, no change in status, stable for surgery.  I have reviewed the patient's chart and labs.  Questions were answered to the patient's satisfaction.     Pilar Plate Tasean Mancha

## 2019-11-08 NOTE — Progress Notes (Signed)
Orthopedic Tech Progress Note Patient Details:  Jason Lewis 05-Aug-1950 [id number]  CPM Left Knee CPM Left Knee: On Left Knee Flexion (Degrees): 40 Left Knee Extension (Degrees): 10 Additional Comments: foot roll  Post Interventions Patient Tolerated: Well Instructions Provided: Care of device  [name] 11/08/2019, 10:19 AM

## 2019-11-08 NOTE — Anesthesia Procedure Notes (Signed)
Date/Time: 11/08/2019 8:08 AM Performed by: Cynda Familia, CRNA Pre-anesthesia Checklist: Patient identified, Emergency Drugs available, Patient being monitored and Timeout performed Patient Re-evaluated:Patient Re-evaluated prior to induction Oxygen Delivery Method: Simple face mask Placement Confirmation: positive ETCO2 and breath sounds checked- equal and bilateral Dental Injury: Teeth and Oropharynx as per pre-operative assessment

## 2019-11-08 NOTE — Op Note (Signed)
OPERATIVE REPORT-TOTAL KNEE ARTHROPLASTY   Pre-operative diagnosis- Osteoarthritis  Left knee(s)  Post-operative diagnosis- Osteoarthritis Left knee(s)  Procedure-  Left  Total Knee Arthroplasty  Surgeon- Dione Plover. Arlo Buffone, MD  Assistant- Theresa Duty, PA-C   Anesthesia-  Adductor canal block and spinal  EBL-50 mL   Drains Hemovac  Tourniquet time-  Total Tourniquet Time Documented: Thigh (Left) - 39 minutes Total: Thigh (Left) - 39 minutes     Complications- None  Condition-PACU - hemodynamically stable.   Brief Clinical Note   Jason Lewis is a 69 y.o. year old male with end stage OA of his left knee with progressively worsening pain and dysfunction. He has constant pain, with activity and at rest and significant functional deficits with difficulties even with ADLs. He has had extensive non-op management including analgesics, injections of cortisone and viscosupplements, and home exercise program, but remains in significant pain with significant dysfunction. Radiographs show bone on bone arthritis medial and patellofemoral. He presents now for left Total Knee Arthroplasty.    Procedure in detail---   The patient is brought into the operating room and positioned supine on the operating table. After successful administration of  Adductor canal block and spinal,   a tourniquet is placed high on the  Left thigh(s) and the lower extremity is prepped and draped in the usual sterile fashion. Time out is performed by the operating team and then the  Left lower extremity is wrapped in Esmarch, knee flexed and the tourniquet inflated to 300 mmHg.       A midline incision is made with a ten blade through the subcutaneous tissue to the level of the extensor mechanism. A fresh blade is used to make a medial parapatellar arthrotomy. Soft tissue over the proximal medial tibia is subperiosteally elevated to the joint line with a knife and into the semimembranosus bursa with a Cobb  elevator. Soft tissue over the proximal lateral tibia is elevated with attention being paid to avoiding the patellar tendon on the tibial tubercle. The patella is everted, knee flexed 90 degrees and the ACL and PCL are removed. Findings are bone on bone medial and patellofemoral with large global osteophytes.        The drill is used to create a starting hole in the distal femur and the canal is thoroughly irrigated with sterile saline to remove the fatty contents. The 5 degree Left  valgus alignment guide is placed into the femoral canal and the distal femoral cutting block is pinned to remove 9 mm off the distal femur. Resection is made with an oscillating saw.      The tibia is subluxed forward and the menisci are removed. The extramedullary alignment guide is placed referencing proximally at the medial aspect of the tibial tubercle and distally along the second metatarsal axis and tibial crest. The block is pinned to remove 34mm off the more deficient medial  side. Resection is made with an oscillating saw. Size 6is the most appropriate size for the tibia and the proximal tibia is prepared with the modular drill and keel punch for that size.      The femoral sizing guide is placed and size 6 is most appropriate. Rotation is marked off the epicondylar axis and confirmed by creating a rectangular flexion gap at 90 degrees. The size 6 cutting block is pinned in this rotation and the anterior, posterior and chamfer cuts are made with the oscillating saw. The intercondylar block is then placed and that cut is made.  Trial size 6 tibial component, trial size 6 posterior stabilized femur and a 6  mm posterior stabilized rotating platform insert trial is placed. Full extension is achieved with excellent varus/valgus and anterior/posterior balance throughout full range of motion. The patella is everted and thickness measured to be 24  mm. Free hand resection is taken to 14 mm, a 38 template is placed, lug holes  are drilled, trial patella is placed, and it tracks normally. Osteophytes are removed off the posterior femur with the trial in place. All trials are removed and the cut bone surfaces prepared with pulsatile lavage. Cement is mixed and once ready for implantation, the size 6 tibial implant, size  6 posterior stabilized femoral component, and the size 38 patella are cemented in place and the patella is held with the clamp. The trial insert is placed and the knee held in full extension. The Exparel (20 ml mixed with 60 ml saline) is injected into the extensor mechanism, posterior capsule, medial and lateral gutters and subcutaneous tissues.  All extruded cement is removed and once the cement is hard the permanent 6 mm posterior stabilized rotating platform insert is placed into the tibial tray.      The wound is copiously irrigated with saline solution and the extensor mechanism closed over a hemovac drain with #1 V-loc suture. The tourniquet is released for a total tourniquet time of 39  minutes. Flexion against gravity is 140 degrees and the patella tracks normally. Subcutaneous tissue is closed with 2.0 vicryl and subcuticular with running 4.0 Monocryl. The incision is cleaned and dried and steri-strips and a bulky sterile dressing are applied. The limb is placed into a knee immobilizer and the patient is awakened and transported to recovery in stable condition.      Please note that a surgical assistant was a medical necessity for this procedure in order to perform it in a safe and expeditious manner. Surgical assistant was necessary to retract the ligaments and vital neurovascular structures to prevent injury to them and also necessary for proper positioning of the limb to allow for anatomic placement of the prosthesis.   Dione Plover Grayce Budden, MD    11/08/2019, 9:26 AM

## 2019-11-08 NOTE — Anesthesia Procedure Notes (Signed)
Anesthesia Regional Block: Adductor canal block   Pre-Anesthetic Checklist: ,, timeout performed, Correct Patient, Correct Site, Correct Laterality, Correct Procedure, Correct Position, site marked, Risks and benefits discussed,  Surgical consent,  Pre-op evaluation,  At surgeon's request and post-op pain management  Laterality: Left  Prep: chloraprep       Needles:  Injection technique: Single-shot  Needle Type: Echogenic Needle     Needle Length: 9cm  Needle Gauge: 21     Additional Needles:   Procedures:,,,, ultrasound used (permanent image in chart),,,,  Narrative:  Start time: 11/08/2019 7:36 AM End time: 11/08/2019 7:46 AM Injection made incrementally with aspirations every 5 mL.  Performed by: Personally  Anesthesiologist: Catalina Gravel, MD  Additional Notes: No pain on injection. No increased resistance to injection. Injection made in 5cc increments.  Good needle visualization.  Patient tolerated procedure well.

## 2019-11-08 NOTE — Discharge Instructions (Addendum)
Gaynelle Arabian, MD Total Joint Specialist [facility] [place] [address], [address] [place], [place] [address] [phone]  TOTAL KNEE REPLACEMENT POSTOPERATIVE DIRECTIONS    Knee Rehabilitation, Guidelines Following Surgery  Results after knee surgery are often greatly improved when you follow the exercise, range of motion and muscle strengthening exercises prescribed by your doctor. Safety measures are also important to protect the knee from further injury. If any of these exercises cause you to have increased pain or swelling in your knee joint, decrease the amount until you are comfortable again and slowly increase them. If you have problems or questions, call your caregiver or physical therapist for advice.   BLOOD CLOT PREVENTION . Take a 10 mg Xarelto once a day for three weeks following surgery. Then resume one 81 mg Aspirin once a day. . You may resume your vitamins/supplements upon once you have discontinued the Xarelto. . Do not take any NSAIDs (Advil, Aleve, Ibuprofen, Meloxicam, etc.) until you have discontinued the Xarelto.   HOME CARE INSTRUCTIONS  . Remove items at home which could result in a fall. This includes throw rugs or furniture in walking pathways.  . ICE to the affected knee as much as tolerated. Icing helps control swelling. If the swelling is well controlled you will be more comfortable and rehab easier. Continue to use ice on the knee for pain and swelling from surgery. You may notice swelling that will progress down to the foot and ankle. This is normal after surgery. Elevate the leg when you are not up walking on it.    . Continue to use the breathing machine which will help keep your temperature down. It is common for your temperature to cycle up and down following surgery, especially at night when you are not up moving around and exerting yourself. The breathing machine keeps your lungs expanded and your temperature down. . Do not place pillow  under the operative knee, focus on keeping the knee straight while resting  DIET You may resume your previous home diet once you are discharged from the hospital.  DRESSING / Julian / SHOWERING . Keep your bulky bandage on for 2 days. On the third post-operative day you may remove the Ace bandage and gauze. There is a waterproof adhesive bandage on your skin which will stay in place until your first follow-up appointment. Once you remove this you will not need to place another bandage . You may begin showering 3 days following surgery, but do not submerge the incision under water.  ACTIVITY For the first 5 days, the key is rest and control of pain and swelling . Do your home exercises twice a day starting on post-operative day 3. On the days you go to physical therapy, just do the home exercises once that day. . You should rest, ice and elevate the leg for 50 minutes out of every hour. Get up and walk/stretch for 10 minutes per hour. After 5 days you can increase your activity slowly as tolerated. . Walk with your walker as instructed. Use the walker until you are comfortable transitioning to a cane. Walk with the cane in the opposite hand of the operative leg. You may discontinue the cane once you are comfortable and walking steadily. . Avoid periods of inactivity such as sitting longer than an hour when not asleep. This helps prevent blood clots.  . You may discontinue the knee immobilizer once you are able to perform a straight leg raise while lying down. Marland Kitchen  You may resume a sexual relationship in one month or when given the OK by your doctor.  . You may return to work once you are cleared by your doctor.  . Do not drive a car for 6 weeks or until released by your surgeon.  . Do not drive while taking narcotics.  TED HOSE STOCKINGS Wear the elastic stockings on both legs for three weeks following surgery during the day. You may remove them at night for sleeping.  WEIGHT BEARING Weight  bearing as tolerated with assist device (walker, cane, etc) as directed, use it as long as suggested by your surgeon or therapist, typically at least 4-6 weeks.  POSTOPERATIVE CONSTIPATION PROTOCOL Constipation - defined medically as fewer than three stools per week and severe constipation as less than one stool per week.  One of the most common issues patients have following surgery is constipation.  Even if you have a regular bowel pattern at home, your normal regimen is likely to be disrupted due to multiple reasons following surgery.  Combination of anesthesia, postoperative narcotics, change in appetite and fluid intake all can affect your bowels.  In order to avoid complications following surgery, here are some recommendations in order to help you during your recovery period.  . Colace (docusate) - Pick up an over-the-counter form of Colace or another stool softener and take twice a day as long as you are requiring postoperative pain medications.  Take with a full glass of water daily.  If you experience loose stools or diarrhea, hold the colace until you stool forms back up. If your symptoms do not get better within 1 week or if they get worse, check with your doctor. . Dulcolax (bisacodyl) - Pick up over-the-counter and take as directed by the product packaging as needed to assist with the movement of your bowels.  Take with a full glass of water.  Use this product as needed if not relieved by Colace only.  . MiraLax (polyethylene glycol) - Pick up over-the-counter to have on hand. MiraLax is a solution that will increase the amount of water in your bowels to assist with bowel movements.  Take as directed and can mix with a glass of water, juice, soda, coffee, or tea. Take if you go more than two days without a movement. Do not use MiraLax more than once per day. Call your doctor if you are still constipated or irregular after using this medication for 7 days in a row.  If you continue to have  problems with postoperative constipation, please contact the office for further assistance and recommendations.  If you experience "the worst abdominal pain ever" or develop nausea or vomiting, please contact the office immediatly for further recommendations for treatment.  ITCHING If you experience itching with your medications, try taking only a single pain pill, or even half a pain pill at a time.  You can also use Benadryl over the counter for itching or also to help with sleep.   MEDICATIONS See your medication summary on the "After Visit Summary" that the nursing staff will review with you prior to discharge.  You may have some home medications which will be placed on hold until you complete the course of blood thinner medication.  It is important for you to complete the blood thinner medication as prescribed by your surgeon.  Continue your approved medications as instructed at time of discharge.  PRECAUTIONS . If you experience chest pain or shortness of breath - call 911 immediately for   transfer to the hospital emergency department.  . If you develop a fever greater that 101 F, purulent drainage from wound, increased redness or drainage from wound, foul odor from the wound/dressing, or calf pain - CONTACT YOUR SURGEON.                                                   FOLLOW-UP APPOINTMENTS Make sure you keep all of your appointments after your operation with your surgeon and caregivers. You should call the office at the above phone number and make an appointment for approximately two weeks after the date of your surgery or on the date instructed by your surgeon outlined in the "After Visit Summary".  RANGE OF MOTION AND STRENGTHENING EXERCISES  Rehabilitation of the knee is important following a knee injury or an operation. After just a few days of immobilization, the muscles of the thigh which control the knee become weakened and shrink (atrophy). Knee exercises are designed to build up the  tone and strength of the thigh muscles and to improve knee motion. Often times heat used for twenty to thirty minutes before working out will loosen up your tissues and help with improving the range of motion but do not use heat for the first two weeks following surgery. These exercises can be done on a training (exercise) mat, on the floor, on a table or on a bed. Use what ever works the best and is most comfortable for you Knee exercises include:  . Leg Lifts - While your knee is still immobilized in a splint or cast, you can do straight leg raises. Lift the leg to 60 degrees, hold for 3 sec, and slowly lower the leg. Repeat 10-20 times 2-3 times daily. Perform this exercise against resistance later as your knee gets better.  . Quad and Hamstring Sets - Tighten up the muscle on the front of the thigh (Quad) and hold for 5-10 sec. Repeat this 10-20 times hourly. Hamstring sets are done by pushing the foot backward against an object and holding for 5-10 sec. Repeat as with quad sets.   Leg Slides: Lying on your back, slowly slide your foot toward your buttocks, bending your knee up off the floor (only go as far as is comfortable). Then slowly slide your foot back down until your leg is flat on the floor again.  Angel Wings: Lying on your back spread your legs to the side as far apart as you can without causing discomfort.  A rehabilitation program following serious knee injuries can speed recovery and prevent re-injury in the future due to weakened muscles. Contact your doctor or a physical therapist for more information on knee rehabilitation.   IF YOU ARE TRANSFERRED TO A SKILLED REHAB FACILITY If the patient is transferred to a skilled rehab facility following release from the hospital, a list of the current medications will be sent to the facility for the patient to continue.  When discharged from the skilled rehab facility, please have the facility set up the patient's Home Health Physical Therapy  prior to being released. Also, the skilled facility will be responsible for providing the patient with their medications at time of release from the facility to include their pain medication, the muscle relaxants, and their blood thinner medication. If the patient is still at the rehab facility at time of the   two week follow up appointment, the skilled rehab facility will also need to assist the patient in arranging follow up appointment in our office and any transportation needs.  MAKE SURE YOU:  . Understand these instructions.  . Get help right away if you are not doing well or get worse.   DENTAL ANTIBIOTICS:  In most cases prophylactic antibiotics for Dental procdeures after total joint surgery are not necessary.  Exceptions are as follows:  1. History of prior total joint infection  2. Severely immunocompromised (Organ Transplant, cancer chemotherapy, Rheumatoid biologic meds such as [place])  3. Poorly controlled diabetes (A1C &gt; 8.0, blood glucose over 200)  If you have one of these conditions, contact your surgeon for an antibiotic prescription, prior to your dental procedure.    Pick up stool softner and laxative for home use following surgery while on pain medications. Do not submerge incision under water. Please use good hand washing techniques while changing dressing each day. May shower starting three days after surgery. Please use a clean towel to pat the incision dry following showers. Continue to use ice for pain and swelling after surgery. Do not use any lotions or creams on the incision until instructed by your surgeon.  Information on my medicine - XARELTO (Rivaroxaban)  This medication education was reviewed with me or my healthcare representative as part of my discharge preparation.  The pharmacist that spoke with me during my hospital stay was:    Why was Xarelto prescribed for you? Xarelto was prescribed for you to reduce the risk of blood clots forming  after orthopedic surgery. The medical term for these abnormal blood clots is venous thromboembolism (VTE).  What do you need to know about xarelto ? Take your Xarelto ONCE DAILY at the same time every day. You may take it either with or without food.  If you have difficulty swallowing the tablet whole, you may crush it and mix in applesauce just prior to taking your dose.  Take Xarelto exactly as prescribed by your doctor and DO NOT stop taking Xarelto without talking to the doctor who prescribed the medication.  Stopping without other VTE prevention medication to take the place of Xarelto may increase your risk of developing a clot.  After discharge, you should have regular check-up appointments with your healthcare provider that is prescribing your Xarelto.    What do you do if you miss a dose? If you miss a dose, take it as soon as you remember on the same day then continue your regularly scheduled once daily regimen the next day. Do not take two doses of Xarelto on the same day.   Important Safety Information A possible side effect of Xarelto is bleeding. You should call your healthcare provider right away if you experience any of the following: ? Bleeding from an injury or your nose that does not stop. ? Unusual colored urine (red or dark brown) or unusual colored stools (red or black). ? Unusual bruising for unknown reasons. ? A serious fall or if you hit your head (even if there is no bleeding).  Some medicines may interact with Xarelto and might increase your risk of bleeding while on Xarelto. To help avoid this, consult your healthcare provider or pharmacist prior to using any new prescription or non-prescription medications, including herbals, vitamins, non-steroidal anti-inflammatory drugs (NSAIDs) and supplements.  This website has more information on Xarelto: [email or web address]

## 2019-11-08 NOTE — Evaluation (Signed)
Physical Therapy Evaluation Patient Details Name: Jason Lewis MRN: [id number] DOB: [birth year] Today's Date: 11/08/2019   History of Present Illness  s/p  L TKA  Clinical Impression  Pt is s/p TKA resulting in the deficits listed below (see PT Problem List).  Pt amb  ~ 51' with RW and min assist. Anticipate pt will progress well in acute setting.   Pt will benefit from skilled PT to increase their independence and safety with mobility to allow discharge to the venue listed below.      Follow Up Recommendations Follow surgeon's recommendation for DC plan and follow-up therapies    Equipment Recommendations  None recommended by PT    Recommendations for Other Services       Precautions / Restrictions Precautions Precautions: Knee Precaution Comments: no pillow under knee Restrictions Weight Bearing Restrictions: No Other Position/Activity Restrictions: WBAT      Mobility  Bed Mobility Overal bed mobility: Needs Assistance Bed Mobility: Supine to Sit     Supine to sit: Min guard     General bed mobility comments: for safety  Transfers Overall transfer level: Needs assistance Equipment used: Rolling walker (2 wheeled) Transfers: Sit to/from Stand Sit to Stand: Min assist;Min guard         General transfer comment: cues for hand placement and LLE position  Ambulation/Gait Ambulation/Gait assistance: Min assist Gait Distance (Feet): 65 Feet Assistive device: Rolling walker (2 wheeled) Gait Pattern/deviations: Step-to pattern     General Gait Details: cues for sequence and RW position  Stairs            Wheelchair Mobility    Modified Rankin (Stroke Patients Only)       Balance                                             Pertinent Vitals/Pain Pain Assessment: 0-10 Pain Score: 1  Pain Location: left thigh Pain Descriptors / Indicators: Discomfort;Sore Pain Intervention(s): Limited activity within patient's  tolerance;Monitored during session;Repositioned    Home Living Family/patient expects to be discharged to:: Private residence   Available Help at Discharge: Family Type of Home: House Home Access: Level entry     Home Layout: Two level;1/2 bath on main level;Bed/bath upstairs Home Equipment: Walker - 2 wheels;Cane - single point 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 AAROM grossly 15 to 70 degrees knee flexion       Communication   Communication: No difficulties  Cognition Arousal/Alertness: Awake/alert Behavior During Therapy: WFL for tasks assessed/performed Overall Cognitive Status: Within Functional Limits for tasks assessed                                        General Comments      Exercises Total Joint Exercises Ankle Circles/Pumps: AROM;Both;5 reps Quad Sets: AROM;Both;5 reps  Assessment/Plan    PT Assessment Patient needs continued PT services  PT Problem List Decreased strength;Decreased mobility;Decreased range of motion;Decreased activity tolerance;Decreased knowledge of use of DME;Pain       PT Treatment Interventions DME instruction;Therapeutic exercise;Gait training;Functional mobility training;Therapeutic activities;Patient/family education;Stair training    PT Goals (Current goals can be found in the Care Plan section)  Acute Rehab PT Goals Patient Stated Goal: home PT Goal Formulation: With patient Time For Goal Achievement: 11/15/19 Potential to Achieve Goals: Good    Frequency 7X/week   Barriers to discharge         Co-evaluation               AM-PAC PT "6 Clicks" Mobility  Outcome Measure Help needed turning from your back to your side while in a flat bed without using bedrails?: A Little Help needed moving from lying on your back to sitting on the side of a flat bed without using bedrails?: A Little Help needed moving to and from a bed to a chair (including a wheelchair)?: A Little Help needed standing up from a chair using your arms (e.g., wheelchair or bedside chair)?: A Little Help needed to walk in hospital room?: A Little Help needed climbing 3-5 steps with a railing? : A Little 6 Click Score: 18    End of Session Equipment Utilized During Treatment: Gait belt Activity Tolerance: Patient tolerated treatment well Patient left: in chair;with call bell/phone within reach;with chair alarm set;with family/visitor present Nurse Communication: Mobility status PT Visit Diagnosis: Difficulty in walking, not elsewhere classified (R26.2)    Time: [id number] PT Time Calculation (min) (ACUTE ONLY): 28 min   Charges:   PT Evaluation $PT Eval Low Complexity: 1 Low PT Treatments $Gait Training: 8-22 mins        Baxter Flattery, PT   Acute Rehab Dept [facility]): [phone]   11/08/2019   [facility] 11/08/2019, 3:17 PM

## 2019-11-08 NOTE — Progress Notes (Signed)
AssistedDr. Turk with left, ultrasound guided, adductor canal block. Side rails up, monitors on throughout procedure. See vital signs in flow sheet. Tolerated Procedure well.  

## 2019-11-08 NOTE — Transfer of Care (Signed)
Immediate Anesthesia Transfer of Care Note  Patient: Jason Lewis  Procedure(s) Performed: TOTAL KNEE ARTHROPLASTY (Left Knee)  Patient Location: PACU  Anesthesia Type:Spinal  Level of Consciousness: awake and alert   Airway & Oxygen Therapy: Patient Spontanous Breathing and Patient connected to face mask oxygen  Post-op Assessment: Report given to RN and Post -op Vital signs reviewed and stable  Post vital signs: Reviewed and stable  Last Vitals:  Vitals Value Taken Time  BP 124/68 11/08/19 0955  Temp    Pulse 54 11/08/19 0957  Resp 15 11/08/19 0957  SpO2 100 % 11/08/19 0957  Vitals shown include unvalidated device data.  Last Pain:  Vitals:   11/08/19 0643  TempSrc: Oral  PainSc: 5       Patients Stated Pain Goal: 3 ([id number])  Complications: No apparent anesthesia complications

## 2019-11-08 NOTE — Anesthesia Postprocedure Evaluation (Signed)
Anesthesia Post Note  Patient: Jason Lewis  Procedure(s) Performed: TOTAL KNEE ARTHROPLASTY (Left Knee)     Patient location during evaluation: PACU Anesthesia Type: Spinal Level of consciousness: oriented and awake and alert Pain management: pain level controlled Vital Signs Assessment: post-procedure vital signs reviewed and stable Respiratory status: spontaneous breathing, respiratory function stable and nonlabored ventilation Cardiovascular status: blood pressure returned to baseline and stable Postop Assessment: no headache, no backache, no apparent nausea or vomiting, patient able to bend at knees and spinal receding Anesthetic complications: no    Last Vitals:  Vitals:   11/08/19 1326 11/08/19 1415  BP: (!) 104/55 (!) 110/59  Pulse: (!) 59 (!) 50  Resp: 17 16  Temp: (!) 36.4 C (!) 36.4 C  SpO2: 99% 95%    Last Pain:  Vitals:   11/08/19 1415  TempSrc: Oral  PainSc:                  Catalina Gravel

## 2019-11-08 NOTE — Anesthesia Procedure Notes (Signed)
Spinal  Patient location during procedure: OR End time: 11/08/2019 8:16 AM Staffing Performed: resident/CRNA  Resident/CRNA: Cynda Familia, CRNA Preanesthetic Checklist Completed: patient identified, IV checked, site marked, risks and benefits discussed, surgical consent, monitors and equipment checked, pre-op evaluation and timeout performed Spinal Block Patient position: sitting Prep: ChloraPrep and site prepped and draped Patient monitoring: heart rate, continuous pulse ox, blood pressure and cardiac monitor Approach: midline Location: L3-4 Injection technique: single-shot Needle Needle type: Sprotte  Needle gauge: 24 G Assessment Sensory level: T4 Additional Notes Expiration date of tray noted and within date.   Patient tolerated procedure well. Gifford Shave present -- assisted and supervised spinal procedure-- prep dry at time of insertion

## 2019-11-08 NOTE — Progress Notes (Signed)
Orthopedic Tech Progress Note Patient Details:  Jason Lewis [birth year] [id number]  CPM Left Knee CPM Left Knee: Off Left Knee Flexion (Degrees): 40 Left Knee Extension (Degrees): 10  Post Interventions Patient Tolerated: Well Instructions Provided: Care of device  [name] 11/08/2019, 4:21 PM

## 2019-11-09 DIAGNOSIS — M1712 Unilateral primary osteoarthritis, left knee: Secondary | ICD-10-CM | POA: Diagnosis not present

## 2019-11-09 LAB — CBC
HCT: 33.3 % — ABNORMAL LOW (ref 39.0–52.0)
Hemoglobin: 11.2 g/dL — ABNORMAL LOW (ref 13.0–17.0)
MCH: 31.5 pg (ref 26.0–34.0)
MCHC: 33.6 g/dL (ref 30.0–36.0)
MCV: 93.5 fL (ref 80.0–100.0)
Platelets: 216 10*3/uL (ref 150–400)
RBC: 3.56 MIL/uL — ABNORMAL LOW (ref 4.22–5.81)
RDW: 11.7 % (ref 11.5–15.5)
WBC: 15.4 10*3/uL — ABNORMAL HIGH (ref 4.0–10.5)
nRBC: 0 % (ref 0.0–0.2)

## 2019-11-09 MED ORDER — TRAMADOL HCL 50 MG PO TABS: 50.0000 mg | ORAL_TABLET | Freq: Four times a day (QID) | ORAL | 0 refills | Status: DC | PRN

## 2019-11-09 MED ORDER — OXYCODONE HCL 5 MG PO TABS: 5.0000 mg | ORAL_TABLET | Freq: Four times a day (QID) | ORAL | 0 refills | Status: DC | PRN

## 2019-11-09 MED ORDER — METHOCARBAMOL 500 MG PO TABS: 500.0000 mg | ORAL_TABLET | Freq: Four times a day (QID) | ORAL | 0 refills | Status: DC | PRN

## 2019-11-09 MED ORDER — RIVAROXABAN 10 MG PO TABS: 10.0000 mg | ORAL_TABLET | Freq: Every day | ORAL | 0 refills | Status: DC

## 2019-11-09 MED ORDER — SODIUM CHLORIDE 0.9 % IV BOLUS
250.0000 mL | Freq: Once | INTRAVENOUS | Status: AC
  Administered 2019-11-09: 250 mL via INTRAVENOUS

## 2019-11-09 MED ORDER — ONDANSETRON HCL 4 MG PO TABS: 4.0000 mg | ORAL_TABLET | Freq: Four times a day (QID) | ORAL | 0 refills | Status: DC | PRN

## 2019-11-09 MED ORDER — GABAPENTIN 300 MG PO CAPS: ORAL_CAPSULE | ORAL | 0 refills | Status: DC

## 2019-11-09 NOTE — Plan of Care (Signed)
  Problem: Activity: Goal: Ability to avoid complications of mobility impairment will improve Outcome: Adequate for Discharge   Problem: Clinical Measurements: Goal: Postoperative complications will be avoided or minimized Outcome: Adequate for Discharge   Problem: Skin Integrity: Goal: Will show signs of wound healing Outcome: Adequate for Discharge   Problem: Education: Goal: Knowledge of General Education information will improve Description: Including pain rating scale, medication(s)/side effects and non-pharmacologic comfort measures Outcome: Adequate for Discharge   Problem: Health Behavior/Discharge Planning: Goal: Ability to manage health-related needs will improve Outcome: Adequate for Discharge   Problem: Clinical Measurements: Goal: Ability to maintain clinical measurements within normal limits will improve Outcome: Adequate for Discharge Goal: Will remain free from infection Outcome: Adequate for Discharge Goal: Diagnostic test results will improve Outcome: Adequate for Discharge   Problem: Activity: Goal: Risk for activity intolerance will decrease Outcome: Adequate for Discharge   Problem: Elimination: Goal: Will not experience complications related to bowel motility Outcome: Adequate for Discharge   Problem: Safety: Goal: Ability to remain free from injury will improve Outcome: Adequate for Discharge

## 2019-11-09 NOTE — Progress Notes (Signed)
Patient discharge instructions/ education given. Patient wife is present. Questions and concerns addressed. Patient denies dizziness upon ambulation or nausea. States that tramadol is controlling pain better. Call bell within reach. Chair alarm set, wheels locked. Belongings within reach.

## 2019-11-09 NOTE — Progress Notes (Signed)
Physical Therapy Treatment Patient Details Name: Jason Lewis MRN: [id number] DOB: 04/04/1951 Today's Date: 11/09/2019    History of Present Illness s/p  L TKA    PT Comments    Pt progressing well. Reviewed HEP, gait/safety. Discussed stairs however pt plans to stay downstairs in level entry home. Ready for d/c from PT standpoint.   Follow Up Recommendations  Follow surgeon's recommendation for DC plan and follow-up therapies     Equipment Recommendations  None recommended by PT    Recommendations for Other Services       Precautions / Restrictions Precautions Precautions: Knee Restrictions Weight Bearing Restrictions: No Other Position/Activity Restrictions: WBAT    Mobility  Bed Mobility Overal bed mobility: Needs Assistance Bed Mobility: Supine to Sit     Supine to sit: Supervision     General bed mobility comments: for safety  Transfers Overall transfer level: Needs assistance Equipment used: Rolling walker (2 wheeled) Transfers: Sit to/from Stand Sit to Stand: Supervision         General transfer comment: cues for hand placement and LLE position  Ambulation/Gait Ambulation/Gait assistance: Supervision Gait Distance (Feet): 180 Feet Assistive device: Rolling walker (2 wheeled) Gait Pattern/deviations: Step-through pattern;Step-to pattern;Decreased stance time - left     General Gait Details: cues for sequence and RW position, gait progression   Stairs             Wheelchair Mobility    Modified Rankin (Stroke Patients Only)       Balance                                            Cognition Arousal/Alertness: Awake/alert Behavior During Therapy: WFL for tasks assessed/performed Overall Cognitive Status: Within Functional Limits for tasks assessed                                        Exercises Total Joint Exercises Ankle Circles/Pumps: AROM;Both;10 reps Quad Sets: AROM;Both;10 reps Short  Arc Quad: AROM;Left;10 reps Heel Slides: AAROM;Left;10 reps Hip ABduction/ADduction: AROM;Left;10 reps Straight Leg Raises: AROM;Left;10 reps Long Arc Quad: AROM;Left;10 reps Goniometric ROM: ~ -5 to 65 degrees L knee flexion     General Comments        Pertinent Vitals/Pain Pain Assessment: 0-10 Pain Score: 4  Pain Location: left thigh Pain Descriptors / Indicators: Discomfort;Sore Pain Intervention(s): Limited activity within patient's tolerance;Monitored during session;Premedicated before session;Repositioned    Home Living                      Prior Function            PT Goals (current goals can now be found in the care plan section) Acute Rehab PT Goals Patient Stated Goal: home PT Goal Formulation: With patient Time For Goal Achievement: 11/15/19 Potential to Achieve Goals: Good Progress towards PT goals: Progressing toward goals    Frequency    7X/week      PT Plan Current plan remains appropriate    Co-evaluation              AM-PAC PT "6 Clicks" Mobility   Outcome Measure  Help needed turning from your back to your side while in a flat bed without using bedrails?: A Little Help needed moving from lying  on your back to sitting on the side of a flat bed without using bedrails?: None Help needed moving to and from a bed to a chair (including a wheelchair)?: A Little Help needed standing up from a chair using your arms (e.g., wheelchair or bedside chair)?: A Little Help needed to walk in hospital room?: A Little Help needed climbing 3-5 steps with a railing? : A Little 6 Click Score: 19    End of Session Equipment Utilized During Treatment: Gait belt Activity Tolerance: Patient tolerated treatment well Patient left: in chair;with call bell/phone within reach;with chair alarm set;with family/visitor present Nurse Communication: Mobility status PT Visit Diagnosis: Difficulty in walking, not elsewhere classified (R26.2)     Time:  [id number] PT Time Calculation (min) (ACUTE ONLY): 30 min  Charges:  $Gait Training: 8-22 mins $Therapeutic Exercise: 8-22 mins                     Baxter Flattery, PT   Acute Rehab Dept [facility]): [id number]   11/09/2019    [facility] 11/09/2019, 10:25 AM

## 2019-11-09 NOTE — Plan of Care (Signed)
Plan of care reviewed and discussed with the patient. 

## 2019-11-09 NOTE — Progress Notes (Addendum)
   Subjective: 1 Day Post-Op Procedure(s) (LRB): TOTAL KNEE ARTHROPLASTY (Left) Patient reports pain as mild.   Patient seen in rounds by Dr. Wynelle Link. Patient is well, and has had no acute complaints or problems. States he is ready to go home. Denies chest pain, SOB, or calf pain. No issues overnight, foley catheter to be discontinued this AM.  We will continue therapy today, ambulated 72' yesterday.   Objective: Vital signs in last 24 hours: Temp:  [97.4 F (36.3 C)-97.8 F (36.6 C)] 97.5 F (36.4 C) (04/13 0555) Pulse Rate:  [45-64] 51 (04/13 0555) Resp:  [8-18] 18 (04/13 0555) BP: (104-145)/(42-73) 114/54 (04/13 0555) SpO2:  [94 %-100 %] 99 % (04/13 0555)  Intake/Output from previous day:  Intake/Output Summary (Last 24 hours) at 11/09/2019 0722 Last data filed at 11/09/2019 0617 Gross per 24 hour  Intake 4182.64 ml  Output 3770 ml  Net 412.64 ml     Labs: Recent Labs    11/09/19 0314  HGB 11.2*   Recent Labs    11/09/19 0314  WBC 15.4*  RBC 3.56*  HCT 33.3*  PLT 216   Exam: General - Patient is Alert and Oriented Extremity - Neurologically intact Neurovascular intact Sensation intact distally Dorsiflexion/Plantar flexion intact Dressing - dressing C/D/I Motor Function - intact, moving foot and toes well on exam.   Past Medical History:  Diagnosis Date  . Arthritis   . CAD in native artery    a.  CABG x 4 utilizing LIMA ot LAD, SVG to Ramus, SVG to Diagonal #2, and SVG to PDA on 05/19/17.  . Chest pain 05/12/2017  . 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 Hyperglycemia    a. A1C 5.7 in 04/2017.  Marland Kitchen Hyperlipidemia   . Medical history non-contributory   . Non-ST elevation (NSTEMI) myocardial infarction (HCC)     Assessment/Plan: 1 Day Post-Op Procedure(s) (LRB): TOTAL KNEE ARTHROPLASTY (Left) Principal Problem:   OA (osteoarthritis) of knee Active Problems:   Primary osteoarthritis of knee  Estimated body mass index is 27.22 kg/m as calculated from the  following:   Height as of this encounter: 5\' 8"  (1.727 m).   Weight as of this encounter: 81.2 kg. Advance diet Up with therapy D/C IV fluids   Patient's anticipated LOS is less than 2 midnights, meeting these requirements: - Younger than 36 - Lives within 1 hour of care - Has a competent adult at home to recover with post-op recover - NO history of  - Chronic pain requiring opiods  - Diabetes  - Coronary Artery Disease  - Heart failure  - Heart attack  - Stroke  - DVT/VTE  - Cardiac arrhythmia  - Respiratory Failure/COPD  - Renal failure  - Anemia  - Advanced Liver disease  DVT Prophylaxis - Xarelto Weight bearing as tolerated. D/C O2 and pulse ox and try on room air. Hemovac pulled without difficulty, will continue therapy today.  BP soft overnight, 250 mL bolus ordered.  Plan is to go Home after hospital stay. Plan for discharge later today if progresses with therapy and is meeting his goals. Scheduled for outpatient PT at [facility]. Follow-up in 2 weeks.   Theresa Duty, PA-C Orthopedic Surgery [phone] 11/09/2019, 7:22 AM

## 2019-11-09 NOTE — TOC Progression Note (Signed)
Transition of Care [facility]) - Progression Note    Patient Details  Name: Jason Lewis MRN: [id number] Date of Birth: August 30, 1950  Transition of Care [facility]) CM/SW [place], LCSW Phone Number: 11/09/2019, 11:30 AM  Clinical Narrative:    Therapy Plan: OPPT at [facility] Has DME     Barriers to Discharge: Barriers Resolved  Expected Discharge Plan and Services           Expected Discharge Date: 11/09/19               DME Arranged: N/A(Patient has a RW, Cane, 3 in 1) DME Agency: NA         HH Agency: NA         Social Determinants of Health (SDOH) Interventions    Readmission Risk Interventions No flowsheet data found.

## 2019-11-10 ENCOUNTER — Encounter: Payer: Self-pay | Admitting: *Deleted

## 2019-11-12 DIAGNOSIS — M25562 Pain in left knee: Secondary | ICD-10-CM | POA: Diagnosis not present

## 2019-11-13 IMAGING — DX DG CHEST 2V
2 series · 2 of 2 positions shown · non-contrast
Comparison: 06/23/2017

CLINICAL DATA: Post CABG

EXAM:
CHEST  2 VIEW

[dg chest 2 view (1 of 2)]
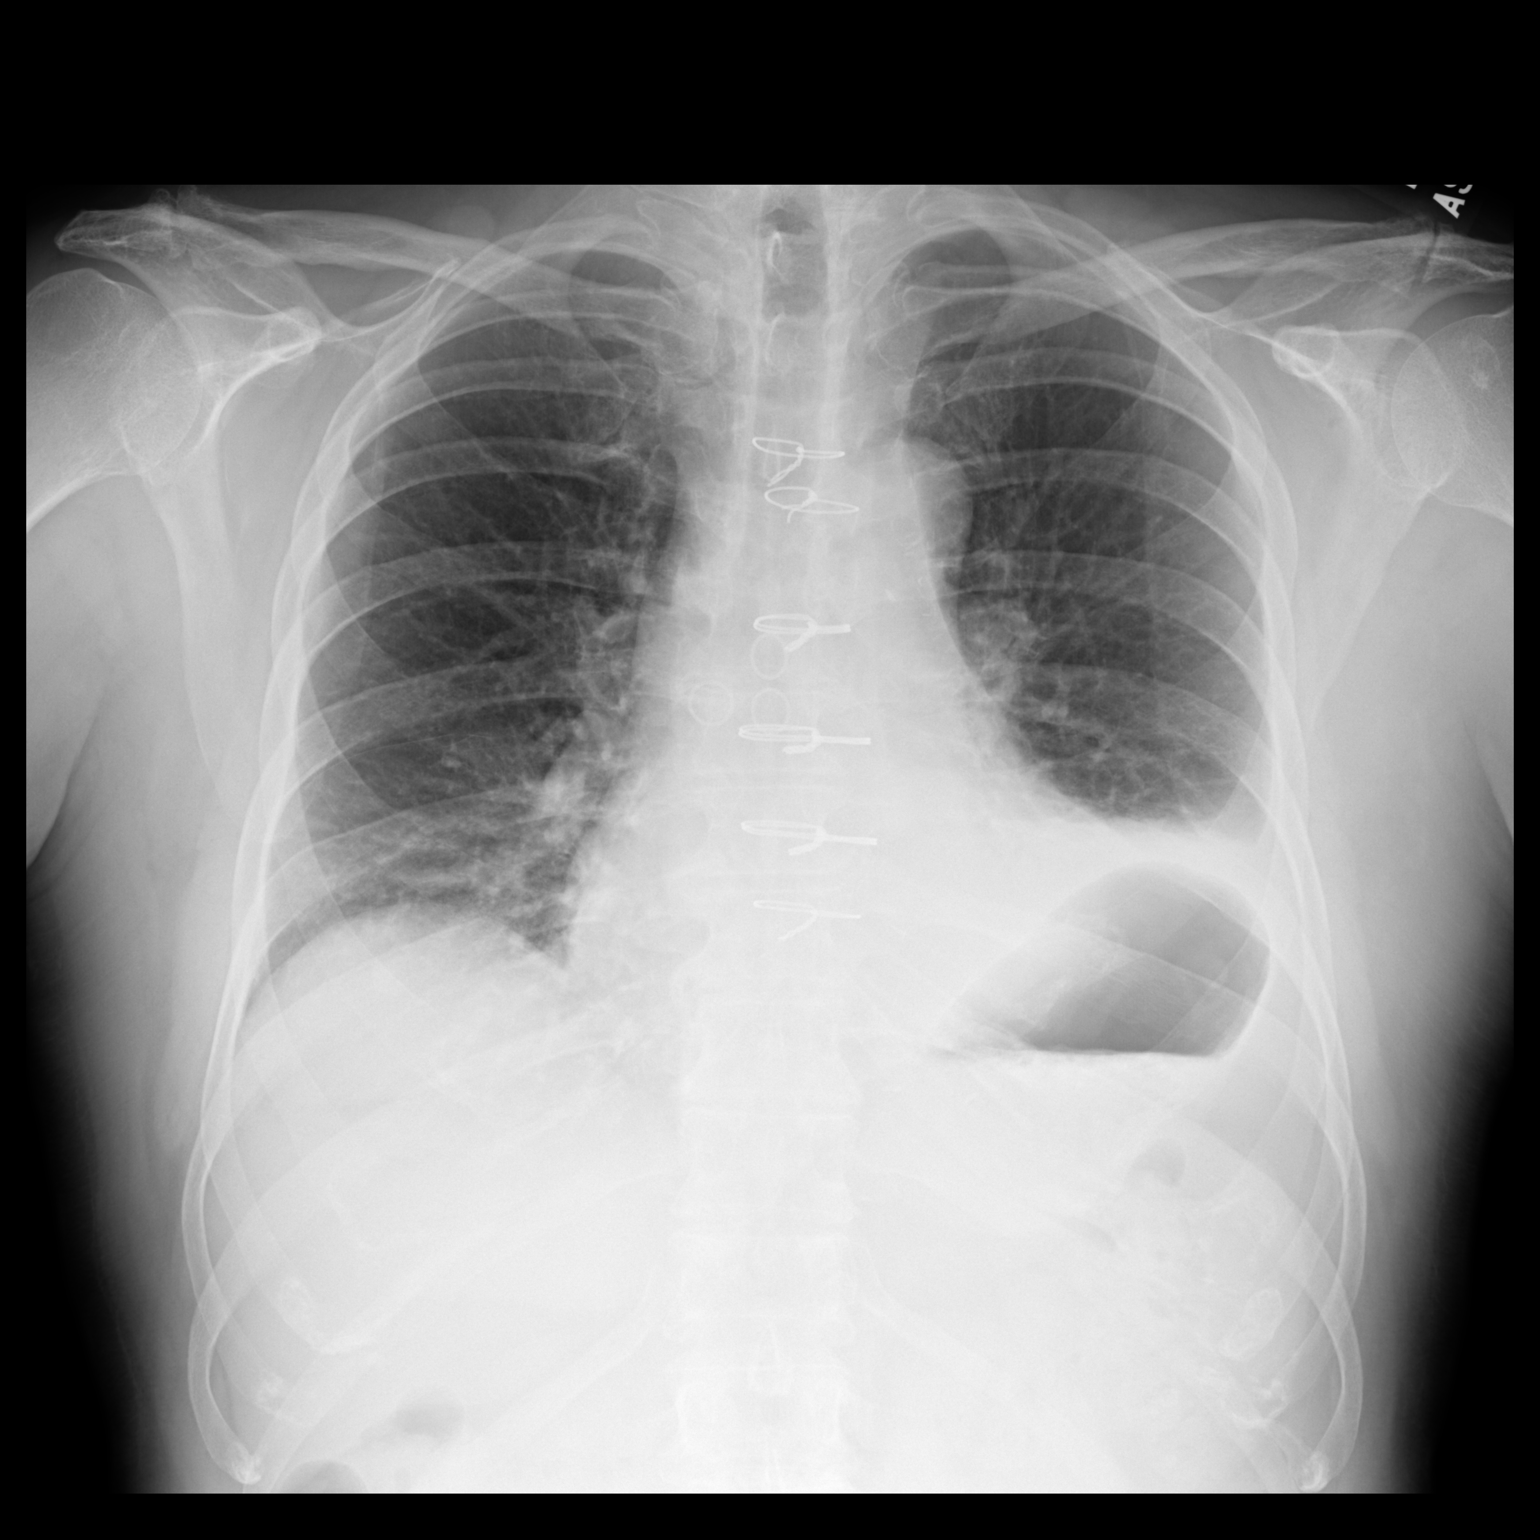

[dg chest 2 view (2 of 2)]
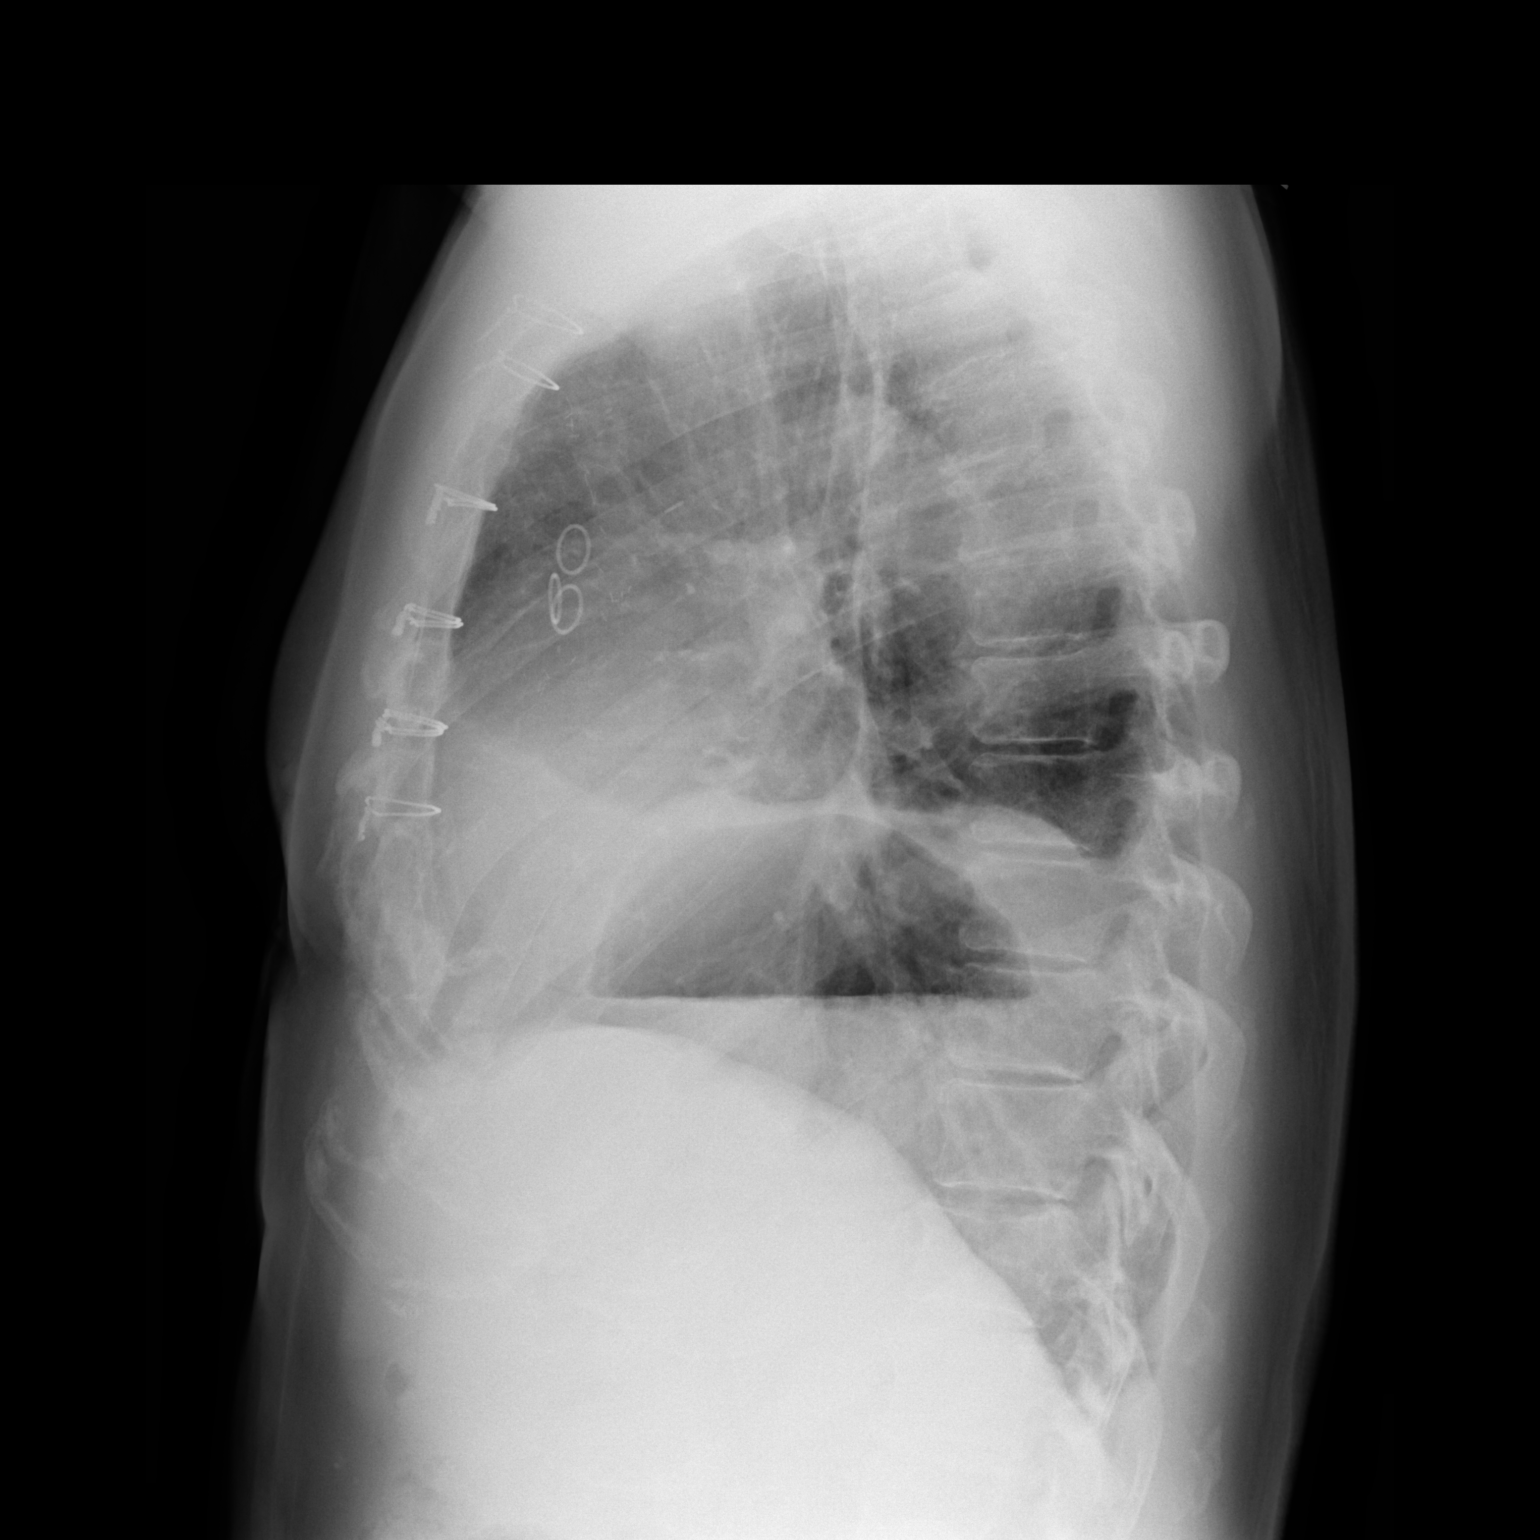

[2 of 2 positions shown; findings below may reference images not displayed]

FINDINGS: Prior CABG. Small to moderate left pleural effusion with elevation
of the left hemidiaphragm and bibasilar atelectasis. Heart is
borderline in size. Mild vascular congestion.
IMPRESSION: Small to moderate left pleural effusion.  Bibasilar atelectasis.

Borderline heart size, vascular congestion.

## 2019-11-16 DIAGNOSIS — M25562 Pain in left knee: Secondary | ICD-10-CM | POA: Diagnosis not present

## 2019-11-18 DIAGNOSIS — M25562 Pain in left knee: Secondary | ICD-10-CM | POA: Diagnosis not present

## 2019-11-18 DIAGNOSIS — M25521 Pain in right elbow: Secondary | ICD-10-CM | POA: Diagnosis not present

## 2019-11-19 DIAGNOSIS — M25562 Pain in left knee: Secondary | ICD-10-CM | POA: Diagnosis not present

## 2019-11-22 DIAGNOSIS — M25562 Pain in left knee: Secondary | ICD-10-CM | POA: Diagnosis not present

## 2019-11-24 DIAGNOSIS — M25562 Pain in left knee: Secondary | ICD-10-CM | POA: Diagnosis not present

## 2019-11-26 DIAGNOSIS — M25562 Pain in left knee: Secondary | ICD-10-CM | POA: Diagnosis not present

## 2019-11-30 DIAGNOSIS — M25562 Pain in left knee: Secondary | ICD-10-CM | POA: Diagnosis not present

## 2019-12-02 DIAGNOSIS — M25562 Pain in left knee: Secondary | ICD-10-CM | POA: Diagnosis not present

## 2019-12-03 DIAGNOSIS — M25521 Pain in right elbow: Secondary | ICD-10-CM | POA: Diagnosis not present

## 2019-12-07 DIAGNOSIS — M25562 Pain in left knee: Secondary | ICD-10-CM | POA: Diagnosis not present

## 2019-12-09 DIAGNOSIS — M25562 Pain in left knee: Secondary | ICD-10-CM | POA: Diagnosis not present

## 2019-12-14 DIAGNOSIS — M25562 Pain in left knee: Secondary | ICD-10-CM | POA: Diagnosis not present

## 2019-12-14 DIAGNOSIS — Z471 Aftercare following joint replacement surgery: Secondary | ICD-10-CM | POA: Diagnosis not present

## 2019-12-14 DIAGNOSIS — Z96652 Presence of left artificial knee joint: Secondary | ICD-10-CM | POA: Diagnosis not present

## 2019-12-17 ENCOUNTER — Other Ambulatory Visit: Payer: Self-pay | Admitting: *Deleted

## 2019-12-17 ENCOUNTER — Telehealth: Payer: Self-pay | Admitting: Cardiology

## 2019-12-17 MED ORDER — METOPROLOL SUCCINATE ER 25 MG PO TB24: 25.0000 mg | ORAL_TABLET | Freq: Every day | ORAL | 0 refills | Status: DC

## 2019-12-17 NOTE — Telephone Encounter (Signed)
Patient called to follow up  °

## 2019-12-17 NOTE — Telephone Encounter (Signed)
Reviewed patients chart, it does not look like we manage this medication for the patient. Please advise. Thanks, MI

## 2019-12-17 NOTE — Telephone Encounter (Signed)
Pt c/o medication issue:  1. Name of Medication: metoprolol succinate (TOPROL-XL) 25 MG 24 hr tablet  2. How are you currently taking this medication (dosage and times per day)? As directed  3. Are you having a reaction (difficulty breathing--STAT)? no  4. What is your medication issue? Patient state that he is out of town and has lost this medication. He wants to know if he should have a refill sent out to where he is or not worry about it.

## 2019-12-17 NOTE — Telephone Encounter (Signed)
Spoke with patient and he states that his medication was lost in luggage transit to [place]. He would really prefer to know if it would be okay for him to be without the medication for the duration of his time in [place] instead of going through the hassle of trying to get to a pharmacy to pick up the medication. I made him aware that I would not be able to advise on this but that we do not ever recommend missing any doses of a medication. I informed him that I would need to forward a note to Dr Radford Pax for recommendation but we may not get an answer back before the office closes today. After further discussion I was able to convince him to allow me to send in an rx. He would only like a 10 day supply as that will last him until he is back into [place]. He provided me with pharmacy information where he would like for me to send the rx.  Rx sent to requested pharmacy and confirmation received. Patient verbalized understanding and appreciation for the return call.

## 2019-12-17 NOTE — Telephone Encounter (Signed)
Dr. Radford Pax manages the pts Toprol XL.  Last OV on 09/14/19 indicates she would like for the pt to continue taking Toprol XL 25 mg po daily.  Ok to refill.

## 2019-12-30 DIAGNOSIS — M25562 Pain in left knee: Secondary | ICD-10-CM | POA: Diagnosis not present

## 2020-01-03 DIAGNOSIS — M25562 Pain in left knee: Secondary | ICD-10-CM | POA: Diagnosis not present

## 2020-01-06 DIAGNOSIS — M25562 Pain in left knee: Secondary | ICD-10-CM | POA: Diagnosis not present

## 2020-01-10 DIAGNOSIS — M25562 Pain in left knee: Secondary | ICD-10-CM | POA: Diagnosis not present

## 2020-01-13 DIAGNOSIS — M25562 Pain in left knee: Secondary | ICD-10-CM | POA: Diagnosis not present

## 2020-04-11 ENCOUNTER — Other Ambulatory Visit: Payer: PPO

## 2020-04-11 DIAGNOSIS — U071 COVID-19: Secondary | ICD-10-CM | POA: Diagnosis not present

## 2020-04-11 DIAGNOSIS — Z20822 Contact with and (suspected) exposure to covid-19: Secondary | ICD-10-CM | POA: Diagnosis not present

## 2020-06-12 DIAGNOSIS — Z125 Encounter for screening for malignant neoplasm of prostate: Secondary | ICD-10-CM | POA: Diagnosis not present

## 2020-06-12 DIAGNOSIS — H9313 Tinnitus, bilateral: Secondary | ICD-10-CM | POA: Diagnosis not present

## 2020-06-12 DIAGNOSIS — I2510 Atherosclerotic heart disease of native coronary artery without angina pectoris: Secondary | ICD-10-CM | POA: Diagnosis not present

## 2020-06-12 DIAGNOSIS — F329 Major depressive disorder, single episode, unspecified: Secondary | ICD-10-CM | POA: Diagnosis not present

## 2020-07-13 DIAGNOSIS — Z20822 Contact with and (suspected) exposure to covid-19: Secondary | ICD-10-CM | POA: Diagnosis not present

## 2020-09-21 ENCOUNTER — Ambulatory Visit: Payer: PPO | Admitting: Cardiology

## 2020-09-21 ENCOUNTER — Encounter: Payer: Self-pay | Admitting: Cardiology

## 2020-09-21 ENCOUNTER — Other Ambulatory Visit: Payer: Self-pay

## 2020-09-21 VITALS — BP 110/70 | HR 46 | Ht 68.0 in | Wt 178.2 lb

## 2020-09-21 DIAGNOSIS — R001 Bradycardia, unspecified: Secondary | ICD-10-CM

## 2020-09-21 DIAGNOSIS — Z125 Encounter for screening for malignant neoplasm of prostate: Secondary | ICD-10-CM | POA: Diagnosis not present

## 2020-09-21 DIAGNOSIS — I10 Essential (primary) hypertension: Secondary | ICD-10-CM | POA: Diagnosis not present

## 2020-09-21 DIAGNOSIS — I2510 Atherosclerotic heart disease of native coronary artery without angina pectoris: Secondary | ICD-10-CM | POA: Diagnosis not present

## 2020-09-21 DIAGNOSIS — T50905A Adverse effect of unspecified drugs, medicaments and biological substances, initial encounter: Secondary | ICD-10-CM | POA: Diagnosis not present

## 2020-09-21 DIAGNOSIS — F329 Major depressive disorder, single episode, unspecified: Secondary | ICD-10-CM | POA: Diagnosis not present

## 2020-09-21 DIAGNOSIS — E785 Hyperlipidemia, unspecified: Secondary | ICD-10-CM

## 2020-09-21 DIAGNOSIS — R002 Palpitations: Secondary | ICD-10-CM | POA: Diagnosis not present

## 2020-09-21 DIAGNOSIS — R5383 Other fatigue: Secondary | ICD-10-CM | POA: Diagnosis not present

## 2020-09-21 DIAGNOSIS — E7800 Pure hypercholesterolemia, unspecified: Secondary | ICD-10-CM | POA: Diagnosis not present

## 2020-09-21 DIAGNOSIS — Z0000 Encounter for general adult medical examination without abnormal findings: Secondary | ICD-10-CM | POA: Diagnosis not present

## 2020-09-21 MED ORDER — METOPROLOL SUCCINATE ER 25 MG PO TB24: 12.5000 mg | ORAL_TABLET | Freq: Every day | ORAL | 3 refills | Status: DC

## 2020-09-21 NOTE — Patient Instructions (Signed)
Medication Instructions:  Your physician has recommended you make the following change in your medication:  DECREASE Toprol to 12.5mg  daily  *If you need a refill on your cardiac medications before your next appointment, please call your pharmacy*   Lab Work: none    Testing/Procedures: none   Follow-Up: At [facility], you and your health needs are our priority.  As part of our continuing mission to provide you with exceptional heart care, we have created designated Provider Care Teams.  These Care Teams include your primary Cardiologist (physician) and Advanced Practice Providers (APPs -  Physician Assistants and Nurse Practitioners) who all work together to provide you with the care you need, when you need it.  Your next appointment:   1 year(s)  The format for your next appointment:   In Person  Provider:   You may see Fransico Him, MD or one of the following Advanced Practice Providers on your designated Care Team:    Melina Copa, PA-C  Ermalinda Barrios, PA-C

## 2020-09-21 NOTE — Progress Notes (Signed)
Cardiology Office Note:    Date:  09/21/2020   ID:  Jason Lewis, DOB Sep 29, 1950, MRN [id number]  PCP:  Deland Pretty, MD  Cardiologist:  Fransico Him, MD    Referring MD: Deland Pretty, MD   Chief Complaint  Patient presents with   Coronary Artery Disease   Hyperlipidemia    History of Present Illness:    Jason Lewis is a 70 y.o. 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 He was put on Plavix because of his NSTEMI.    He had been on high-dose atorvastatin but developed myalgias and his Lipitor was decreased to 40 mg daily.  He was last seen by Estella Husk, PA and was complaining of palpitations.  It was recommended that he cut back on caffeine and sugar and his sx resolved. He had a nuclear stress test 09/2018 for preop clearance for knee surgery and this was normal.   He is here today for followup and is doing well.  He occasionally has a pin point area over his sternum that is not exertional and thinks it is on his bone.  This is chronic and not changed in a few years.  He denies any exertional chest pain or pressure, SOB, DOE, PND, orthopnea, LE edema, dizziness, palpitations or syncope. He is compliant with his meds and is tolerating meds with no SE.    Past Medical History:  Diagnosis Date   Arthritis    CAD in native artery    a.  CABG x 4 utilizing LIMA ot LAD, SVG to Ramus, SVG to Diagonal #2, and SVG to PDA on 05/19/17.   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 A1C 5.7 in 04/2017.   Hyperlipidemia    Medical history non-contributory    Non-ST elevation (NSTEMI) myocardial infarction [facility])     Past Surgical History:  Procedure Laterality Date   CARDIAC CATHETERIZATION  05/13/2017   Dr Linard Millers [place] GRAFT N/A 05/19/2017   Procedure: CORONARY ARTERY BYPASS  GRAFTING (CABG) x 4;  Surgeon: Ivin Poot, MD;  Location: [place];  Service: Open Heart Surgery;  Laterality: N/A;   HERNIA REPAIR     KNEE ARTHROSCOPY WITH SUBCHONDROPLASTY Left 01/13/2019   Procedure: Left knee arthroscopic partial medial and lateral meniscectomies with medial tibial and medial femoral condyle subchondroplasty;  Surgeon: [name], MD;  Location: [facility];  Service: Orthopedics;  Laterality: Left;   KNEE SURGERY Left 2005   LEFT HEART CATH AND CORONARY ANGIOGRAPHY N/A 05/13/2017   Procedure: LEFT HEART CATH AND CORONARY ANGIOGRAPHY;  Surgeon: Belva Crome, MD;  Location: [facility];  Service: Cardiovascular;  Laterality: N/A;   TEE WITHOUT CARDIOVERSION N/A 05/19/2017   Procedure: TRANSESOPHAGEAL ECHOCARDIOGRAM (TEE);  Surgeon: Prescott Gum, Collier Salina, MD;  Location: [facility];  Service: Open Heart Surgery;  Laterality: N/A;   TONSILLECTOMY     TOTAL KNEE ARTHROPLASTY Left 11/08/2019   Procedure: TOTAL KNEE ARTHROPLASTY;  Surgeon: Gaynelle Arabian, MD;  Location: [facility];  Service: Orthopedics;  Laterality: Left;  69min   ULTRASOUND GUIDANCE FOR VASCULAR ACCESS  05/13/2017   Procedure: Ultrasound Guidance For Vascular Access;  Surgeon: Belva Crome, MD;  Location: [place] [facility];  Service:  Cardiovascular;;    Current Medications: Current Meds  Medication Sig   metoprolol succinate (TOPROL-XL) 25 MG 24 hr tablet Take 1 tablet (25 mg total) by mouth daily.   sertraline (ZOLOFT) 50 MG tablet Take 50 mg by mouth daily.   [DISCONTINUED] atorvastatin (LIPITOR) 40 MG tablet Take 1 tablet (40 mg total) by mouth daily.   [DISCONTINUED] gabapentin (NEURONTIN) 300 MG capsule Take a 300 mg capsule three times a day for two weeks following surgery.Then take a 300 mg capsule two times a day for two weeks. Then take a 300 mg capsule once a day for two weeks. Then discontinue.   [DISCONTINUED] methocarbamol (ROBAXIN) 500 MG tablet Take 1 tablet  (500 mg total) by mouth every 6 (six) hours as needed for muscle spasms.   [DISCONTINUED] ondansetron (ZOFRAN) 4 MG tablet Take 1 tablet (4 mg total) by mouth every 6 (six) hours as needed for nausea.   [DISCONTINUED] oxyCODONE (OXY IR/ROXICODONE) 5 MG immediate release tablet Take 1-2 tablets (5-10 mg total) by mouth every 6 (six) hours as needed for severe pain. Not to exceed 6 tablets a day   [DISCONTINUED] traMADol (ULTRAM) 50 MG tablet Take 1-2 tablets (50-100 mg total) by mouth every 6 (six) hours as needed for moderate pain.     Allergies:   Patient has no known allergies.   Social History   Socioeconomic History   Marital status: Married    Spouse name: Not on file   Number of children: Not on file   Years of education: Not on file   Highest education level: Not on file  Occupational History   Occupation: Painter  Tobacco Use   Smoking status: Never Smoker   Smokeless tobacco: Never Used  Scientific laboratory technician Use: Never used  Substance and Sexual Activity   Alcohol use: Yes    Alcohol/week: 7.0 standard drinks    Types: 7 Glasses of wine per week   Drug use: No   Sexual activity: Not on file  Other Topics Concern   Not on file  Social History Narrative   Not on file   Social Determinants of Health   Financial Resource Strain: Not on file  Food Insecurity: Not on file  Transportation Needs: Not on file  Physical Activity: Not on file  Stress: Not on file  Social Connections: Not on file     Family History: [name] family history includes Angina in his father; Coronary artery disease in his brother.  ROS:   Please see the history of present illness.    ROS  All other systems reviewed and negative.   EKGs/Labs/Other Studies Reviewed:    The following studies were reviewed today: Nuclear stress test, outside labs on [facility]  EKG:  EKG is  ordered today.  The ekg ordered today demonstrates NSR with IRBBB  Recent Labs: 11/04/2019: ALT 22;  BUN 23; Creatinine, Ser 1.06; Potassium 4.0; Sodium 144 11/09/2019: Hemoglobin 11.2; Platelets 216   Recent Lipid Panel    Component Value Date/Time   CHOL 199 09/21/2019 0733   TRIG 158 (H) 09/21/2019 0733   HDL 42 09/21/2019 0733   CHOLHDL 4.7 09/21/2019 0733   CHOLHDL 4.8 05/12/2017 1832   VLDL 25 05/12/2017 1832   LDLCALC 129 (H) 09/21/2019 0733    Physical Exam:    VS:  BP 110/70    Pulse (!) 46    Ht 5\' 8"  (1.727 m)    Wt 178 lb 3.2 oz (80.8 kg)  SpO2 97%    BMI 27.10 kg/m     Wt Readings from Last 3 Encounters:  09/21/20 178 lb 3.2 oz (80.8 kg)  11/08/19 179 lb (81.2 kg)  11/04/19 179 lb (81.2 kg)     GEN: Well nourished, well developed in no acute distress HEENT: Normal NECK: No JVD; No carotid bruits LYMPHATICS: No lymphadenopathy CARDIAC:RRR, no murmurs, rubs, gallops RESPIRATORY:  Clear to auscultation without rales, wheezing or rhonchi  ABDOMEN: Soft, non-tender, non-distended MUSCULOSKELETAL:  No edema; No deformity  SKIN: Warm and dry NEUROLOGIC:  Alert and oriented x 3 PSYCHIATRIC:  Normal affect    ASSESSMENT:    1. Coronary artery disease involving native coronary artery of native heart without angina pectoris   2. Essential hypertension   3. Hyperlipidemia LDL goal <70   4. Palpitation   5. Drug-induced bradycardia    PLAN:    In order of problems listed above:  1.  ASCAD -s/p NSTEMI followed by CABG x4 with LIMA to the LAD, SVG to the ramus, SVG to the diagonal 2, SVG to PDA 05/19/17.  -he has not had any anginal CP -continue ASA 81mg  daily, BB and statin  2.  HTN -BP controlled -Decrease Toprol to 12.5mg  daily due to bradycardia  3.  HLD -LDL goal < 70 -LDL was reviewed on outside labs from PCP on [facility] and 129 a year ago -repeat FLP and ALT -continue atorvastatin 40mg  daily  -he got myalgias on higher doses of statin  4.  Palpitations  -improvedafter cutting back on caffeine and are very infrequent now  5.  Drug induced  bradycardia -he is asymptomatic -decrease Toprol to 12.5mg  daily   Medication Adjustments/Labs and Tests Ordered: Current medicines are reviewed at length with the patient today.  Concerns regarding medicines are outlined above.  Orders Placed This Encounter  Procedures   EKG 12-Lead   No orders of the defined types were placed in this encounter.   Signed, Fransico Him, MD  09/21/2020 8:52 AM    [facility]

## 2020-09-21 NOTE — Addendum Note (Signed)
Addended by: Para March on: 09/21/2020 08:56 AM   Modules accepted: Orders

## 2020-09-25 ENCOUNTER — Telehealth: Payer: Self-pay

## 2020-09-25 NOTE — Telephone Encounter (Signed)
Called pt and left message informing him that his medication metoprolol was sent to his pharmacy as requested and spoke with the pharmacy and they do have this medication which was received on 09/21/20 with a year supply, but the pharmacy stated that pt picked up medication on 08/27/20, 90 day supply and pt's insurance will not pay for it until 11/03/20 and if he has any other problems, questions or concerns, to give our office a call back.

## 2020-11-06 DIAGNOSIS — K621 Rectal polyp: Secondary | ICD-10-CM | POA: Diagnosis not present

## 2020-11-06 DIAGNOSIS — Z1211 Encounter for screening for malignant neoplasm of colon: Secondary | ICD-10-CM | POA: Diagnosis not present

## 2020-11-08 DIAGNOSIS — K621 Rectal polyp: Secondary | ICD-10-CM | POA: Diagnosis not present

## 2021-08-15 DIAGNOSIS — E7800 Pure hypercholesterolemia, unspecified: Secondary | ICD-10-CM | POA: Diagnosis not present

## 2021-08-15 DIAGNOSIS — Z0000 Encounter for general adult medical examination without abnormal findings: Secondary | ICD-10-CM | POA: Diagnosis not present

## 2021-08-15 DIAGNOSIS — R5383 Other fatigue: Secondary | ICD-10-CM | POA: Diagnosis not present

## 2021-08-15 DIAGNOSIS — I2510 Atherosclerotic heart disease of native coronary artery without angina pectoris: Secondary | ICD-10-CM | POA: Diagnosis not present

## 2021-08-15 DIAGNOSIS — Z125 Encounter for screening for malignant neoplasm of prostate: Secondary | ICD-10-CM | POA: Diagnosis not present

## 2021-08-15 DIAGNOSIS — I10 Essential (primary) hypertension: Secondary | ICD-10-CM | POA: Diagnosis not present

## 2021-09-26 ENCOUNTER — Ambulatory Visit: Payer: PPO | Admitting: Cardiology

## 2021-09-26 ENCOUNTER — Other Ambulatory Visit: Payer: Self-pay

## 2021-09-26 ENCOUNTER — Encounter: Payer: Self-pay | Admitting: Cardiology

## 2021-09-26 VITALS — BP 112/62 | HR 47 | Ht 68.0 in | Wt 182.0 lb

## 2021-09-26 DIAGNOSIS — I10 Essential (primary) hypertension: Secondary | ICD-10-CM | POA: Diagnosis not present

## 2021-09-26 DIAGNOSIS — I2510 Atherosclerotic heart disease of native coronary artery without angina pectoris: Secondary | ICD-10-CM

## 2021-09-26 DIAGNOSIS — R001 Bradycardia, unspecified: Secondary | ICD-10-CM

## 2021-09-26 DIAGNOSIS — R002 Palpitations: Secondary | ICD-10-CM | POA: Diagnosis not present

## 2021-09-26 DIAGNOSIS — T50905A Adverse effect of unspecified drugs, medicaments and biological substances, initial encounter: Secondary | ICD-10-CM

## 2021-09-26 DIAGNOSIS — T50905D Adverse effect of unspecified drugs, medicaments and biological substances, subsequent encounter: Secondary | ICD-10-CM

## 2021-09-26 DIAGNOSIS — E785 Hyperlipidemia, unspecified: Secondary | ICD-10-CM

## 2021-09-26 MED ORDER — ASPIRIN EC 81 MG PO TBEC: 81.0000 mg | DELAYED_RELEASE_TABLET | Freq: Every day | ORAL | 3 refills | Status: DC

## 2021-09-26 NOTE — Patient Instructions (Signed)
Medication Instructions:  ?Your physician has recommended you make the following change in your medication:  ?1) RESTART taking Aspirin 81 mg daily ?*If you need a refill on your cardiac medications before your next appointment, please call your pharmacy* ? ?Lab Work: ?TODAY: Fasting lipids and ALT ?If you have labs (blood work) drawn today and your tests are completely normal, you will receive your results only by: ?MyChart Message (if you have MyChart) OR ?A paper copy in the mail ?If you have any lab test that is abnormal or we need to change your treatment, we will call you to review the results. ? ?Follow-Up: ?At [facility], you and your health needs are our priority.  As part of our continuing mission to provide you with exceptional heart care, we have created designated Provider Care Teams.  These Care Teams include your primary Cardiologist (physician) and Advanced Practice Providers (APPs -  Physician Assistants and Nurse Practitioners) who all work together to provide you with the care you need, when you need it. ? ?Your next appointment:   ?1 year(s) ? ?The format for your next appointment:   ?In Person ? ?Provider:   ?Fransico Him, MD   ? ?Other Instructions ?You have been referred to see our PharmD in the [facility].   ?

## 2021-09-26 NOTE — Addendum Note (Signed)
Addended by: Antonieta Iba on: 09/26/2021 08:28 AM ? ? Modules accepted: Orders ? ?

## 2021-09-26 NOTE — Progress Notes (Addendum)
?Cardiology Office Note:   ? ?Date:  09/26/2021  ? ?ID:  Jason Lewis, DOB [birth year], MRN [id number] ? ?PCP:  Deland Pretty, MD  ?Cardiologist:  Fransico Him, MD   ? ?Referring MD: Deland Pretty, MD  ? ?Chief Complaint  ?Patient presents with  ? Coronary Artery Disease  ? Hypertension  ? ? ?History of Present Illness:   ? ?Jason Lewis is a 71 y.o. 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  He was put on Plavix because of his NSTEMI.  ?He had been on high-dose atorvastatin but developed myalgias and his Lipitor was decreased to 40 mg daily.  He had a nuclear stress test 09/2018 for preop clearance for knee surgery and this was normal.  ? ?He is here today for followup and is doing well.  He denies any chest pain or pressure, SOB, DOE (except with extreme exertion), PND, orthopnea, LE edema, dizziness or syncope. He occasionally has a skipped heart beat. He is compliant with his meds and is tolerating meds with no SE.    ? ?Past Medical History:  ?Diagnosis Date  ? Arthritis   ? CAD in native artery   ? a.  CABG x 4 utilizing LIMA ot LAD, SVG to Ramus, SVG to Diagonal #2, and SVG to PDA on 05/19/17.  ? Chest pain 05/12/2017  ? Hx of CABG 05/19/2017  ? Hyperglycemia   ? a. A1C 5.7 in 04/2017.  ? Hyperlipidemia   ? Medical history non-contributory   ? Non-ST elevation (NSTEMI) myocardial infarction [facility])   ? ? ?Past Surgical History:  ?Procedure Laterality Date  ? CARDIAC CATHETERIZATION  05/13/2017  ? Dr Linard Millers 111  ? COLONOSCOPY    ? CORONARY ARTERY BYPASS GRAFT N/A 05/19/2017  ? Procedure: CORONARY ARTERY BYPASS GRAFTING (CABG) x 4;  Surgeon: Ivin Poot, MD;  Location: [place];  Service: Open Heart Surgery;  Laterality: N/A;  ? HERNIA REPAIR    ? KNEE ARTHROSCOPY WITH SUBCHONDROPLASTY Left 01/13/2019  ? Procedure: Left knee arthroscopic partial medial and lateral meniscectomies with medial  tibial and medial femoral condyle subchondroplasty;  Surgeon: [name], MD;  Location: [facility];  Service: Orthopedics;  Laterality: Left;  ? KNEE SURGERY Left 2005  ? LEFT HEART CATH AND CORONARY ANGIOGRAPHY N/A 05/13/2017  ? Procedure: LEFT HEART CATH AND CORONARY ANGIOGRAPHY;  Surgeon: Belva Crome, MD;  Location: [place] [facility];  Service: Cardiovascular;  Laterality: N/A;  ? TEE WITHOUT CARDIOVERSION N/A 05/19/2017  ? Procedure: TRANSESOPHAGEAL ECHOCARDIOGRAM (TEE);  Surgeon: Prescott Gum, Collier Salina, MD;  Location: [place];  Service: Open Heart Surgery;  Laterality: N/A;  ? TONSILLECTOMY    ? TOTAL KNEE ARTHROPLASTY Left 11/08/2019  ? Procedure: TOTAL KNEE ARTHROPLASTY;  Surgeon: Gaynelle Arabian, MD;  Location: [facility];  Service: Orthopedics;  Laterality: Left;  62min  ? ULTRASOUND GUIDANCE FOR VASCULAR ACCESS  05/13/2017  ? Procedure: Ultrasound Guidance For Vascular Access;  Surgeon: Belva Crome, MD;  Location: [place] [facility];  Service: Cardiovascular;;  ? ? ?Current Medications: ?Current Meds  ?Medication Sig  ? metoprolol succinate (TOPROL XL) 25 MG 24 hr tablet Take 0.5 tablets (12.5 mg total) by mouth daily.  ? sertraline (ZOLOFT) 50 MG tablet Take 50 mg by mouth daily.  ?  ? ?Allergies:   Patient has  no known allergies.  ? ?Social History  ? ?Socioeconomic History  ? Marital status: Married  ?  Spouse name: Not on file  ? Number of children: Not on file  ? Years of education: Not on file  ? Highest education level: Not on file  ?Occupational History  ? Occupation: [employer]  ?Tobacco Use  ? Smoking status: Never  ? Smokeless tobacco: Never  ?Vaping Use  ? Vaping Use: Never used  ?Substance and Sexual Activity  ? Alcohol use: Yes  ?  Alcohol/week: 7.0 standard drinks  ?  Types: 7 Glasses of wine per week  ? Drug use: No  ? Sexual activity: Not on file  ?Other Topics Concern  ? Not on file  ?Social History Narrative  ? Not on file  ? ?Social Determinants of Health  ? ?Financial  Resource Strain: Not on file  ?Food Insecurity: Not on file  ?Transportation Needs: Not on file  ?Physical Activity: Not on file  ?Stress: Not on file  ?Social Connections: Not on file  ?  ? ?Family History: ?[name] family history includes Angina in his father; Coronary artery disease in his brother. ? ?ROS:   ?Please see the history of present illness.    ?ROS  ?All other systems reviewed and negative.  ? ?EKGs/Labs/Other Studies Reviewed:   ? ?The following studies were reviewed today: ?Nuclear stress test, outside labs on [facility] ? ?EKG:  EKG is  ordered today.  The ekg ordered today demonstrates sinus bradycardia at 47bpm  ? ?Recent Labs: ?No results found for requested labs within last 8760 hours.  ? ?Recent Lipid Panel ?   ?Component Value Date/Time  ? CHOL 199 09/21/2019 0733  ? TRIG 158 (H) 09/21/2019 0733  ? HDL 42 09/21/2019 0733  ? CHOLHDL 4.7 09/21/2019 0733  ? CHOLHDL 4.8 05/12/2017 1832  ? VLDL 25 05/12/2017 1832  ? LDLCALC 129 (H) 09/21/2019 7591  ? ? ?Physical Exam:   ? ?VS:  BP 112/62 (BP Location: Left Arm, Patient Position: Sitting, Cuff Size: Normal)   Pulse (!) 47   Ht 5\' 8"  (1.727 m)   Wt 182 lb (82.6 kg)   BMI 27.67 kg/m?    ? ?Wt Readings from Last 3 Encounters:  ?09/26/21 182 lb (82.6 kg)  ?09/21/20 178 lb 3.2 oz (80.8 kg)  ?11/08/19 179 lb (81.2 kg)  ?  ?GEN: Well nourished, well developed in no acute distress ?HEENT: Normal ?NECK: No JVD; No carotid bruits ?LYMPHATICS: No lymphadenopathy ?CARDIAC:RRR, no murmurs, rubs, gallops ?RESPIRATORY:  Clear to auscultation without rales, wheezing or rhonchi  ?ABDOMEN: Soft, non-tender, non-distended ?MUSCULOSKELETAL:  No edema; No deformity  ?SKIN: Warm and dry ?NEUROLOGIC:  Alert and oriented x 3 ?PSYCHIATRIC:  Normal affect   ?ASSESSMENT:   ? ?1. Coronary artery disease involving native coronary artery of native heart without angina pectoris   ?2. Essential hypertension   ?3. Hyperlipidemia LDL goal <70   ?4. Palpitation   ?5. Drug-induced  bradycardia   ? ? ?PLAN:   ? ?In order of problems listed above: ? ?1.  ASCAD ?-s/p NSTEMI followed by CABG x4 with LIMA to the LAD, SVG to the ramus, SVG to the diagonal 2, SVG to PDA 05/19/17.  ?-He denies any anginal symptoms ?-Continue prescription drug management with Toprol-XL 12.5 mg daily ?-restart ASA 81mg  daily ?-statin intolerant ? ?2.  HTN ?-BP is adequately controlled on exam ?-Continue prescription drug management with Toprol-XL 12.5 mg daily with as needed refills ? ?3.  HLD ?-LDL goal < 70 ?-Check FLP and ALT ?-he is off statin due to intolerance ?-refer to lipid clinc ? ?4.  Palpitations ? -only occur very infrequently ? ?5.  Drug induced bradycardia ?-He remains asymptomatic on Toprol low-dose ? ? ? ?Medication Adjustments/Labs and Tests Ordered: ?Current medicines are reviewed at length with the patient today.  Concerns regarding medicines are outlined above.  ?Orders Placed This Encounter  ?Procedures  ? EKG 12-Lead  ? ?No orders of the defined types were placed in this encounter. ? ? ?Signed, ?Fransico Him, MD  ?09/26/2021 8:20 AM    ?[place] ?

## 2021-10-18 ENCOUNTER — Ambulatory Visit: Payer: PPO

## 2021-10-18 NOTE — Progress Notes (Deleted)
Patient ID: Jason Lewis                 DOB: October 26, 1950                    MRN: [id number] ? ? ? ? ?HPI: ?Jason Lewis is a 71 y.o. male patient referred to lipid clinic by Dr. Radford Pax. PMH is significant for hypertension, HLD, CAD status post NSTEMI followed by CABG x4 with LIMA to the LAD, SVG to the ramus, SVG to the diagonal 2, SVG to PDA 05/19/17. He had been on high-dose atorvastatin but developed myalgias and his Lipitor was decreased to 40 mg daily.  ? ?Did he do better with '40mg'$ ? ?Try a different statin? ?Labs in feb no therapy? ?HTA  ? ?Current Medications:  ?Intolerances:  ?Risk Factors:  ?LDL-C goal: <70  ?Apo B goal: <80  ? ?Diet:  ? ?Exercise:  ? ?Family History: [name] family history includes Angina in his father; Coronary artery disease in his brother. ? ?Social History:  ? ?Labs: 09/21/20 TC 195 TG 104 HDL 42 LDL 134 (no therapy) ? ?Past Medical History:  ?Diagnosis Date  ? Arthritis   ? CAD in native artery   ? a.  CABG x 4 utilizing LIMA ot LAD, SVG to Ramus, SVG to Diagonal #2, and SVG to PDA on 05/19/17.  ? Chest pain 05/12/2017  ? Hx of CABG 05/19/2017  ? Hyperglycemia   ? a. A1C 5.7 in 04/2017.  ? Hyperlipidemia   ? Medical history non-contributory   ? Non-ST elevation (NSTEMI) myocardial infarction [facility])   ? ? ?Current Outpatient Medications on File Prior to Visit  ?Medication Sig Dispense Refill  ? aspirin EC 81 MG tablet Take 1 tablet (81 mg total) by mouth daily. Swallow whole. 90 tablet 3  ? metoprolol succinate (TOPROL XL) 25 MG 24 hr tablet Take 0.5 tablets (12.5 mg total) by mouth daily. 45 tablet 3  ? sertraline (ZOLOFT) 50 MG tablet Take 50 mg by mouth daily.    ? ?No current facility-administered medications on file prior to visit.  ? ? ?No Known Allergies ? ?Assessment/Plan: ? ?1. Hyperlipidemia -  ? ? ?Thank you, ? ? ?Ramond Dial, Pharm.D, BCPS, CPP ?[id number], [place], [place] [address]  ?Phone: [phone]; Fax: [phone]  ? ? ?

## 2021-10-24 ENCOUNTER — Telehealth: Payer: Self-pay

## 2021-10-24 NOTE — Telephone Encounter (Signed)
Lmom for R/s missed appt for lipid ?

## 2022-01-03 DIAGNOSIS — M25561 Pain in right knee: Secondary | ICD-10-CM | POA: Diagnosis not present

## 2022-01-03 DIAGNOSIS — Z96652 Presence of left artificial knee joint: Secondary | ICD-10-CM | POA: Diagnosis not present

## 2022-01-03 DIAGNOSIS — M1711 Unilateral primary osteoarthritis, right knee: Secondary | ICD-10-CM | POA: Diagnosis not present

## 2022-02-20 DIAGNOSIS — M1711 Unilateral primary osteoarthritis, right knee: Secondary | ICD-10-CM | POA: Diagnosis not present

## 2022-02-28 DIAGNOSIS — M1712 Unilateral primary osteoarthritis, left knee: Secondary | ICD-10-CM | POA: Diagnosis not present

## 2022-04-11 DIAGNOSIS — M1712 Unilateral primary osteoarthritis, left knee: Secondary | ICD-10-CM | POA: Diagnosis not present

## 2022-09-23 ENCOUNTER — Other Ambulatory Visit: Payer: Self-pay

## 2022-09-23 DIAGNOSIS — Z0000 Encounter for general adult medical examination without abnormal findings: Secondary | ICD-10-CM | POA: Diagnosis not present

## 2022-09-23 DIAGNOSIS — I2510 Atherosclerotic heart disease of native coronary artery without angina pectoris: Secondary | ICD-10-CM | POA: Diagnosis not present

## 2022-09-23 DIAGNOSIS — Z125 Encounter for screening for malignant neoplasm of prostate: Secondary | ICD-10-CM | POA: Diagnosis not present

## 2022-09-23 DIAGNOSIS — R7303 Prediabetes: Secondary | ICD-10-CM | POA: Diagnosis not present

## 2022-09-23 MED ORDER — METOPROLOL SUCCINATE ER 25 MG PO TB24: 25.0000 mg | ORAL_TABLET | Freq: Every day | ORAL | 3 refills | Status: DC

## 2022-09-23 NOTE — Telephone Encounter (Signed)
Received a refill request stating that patient take Metoprolol 25 mg daily. On patient med list it states he should be taking 12.5 mg daily.   Spoke with patient who states that he is take Meto 25 mg daily because he felt that it worked better for him. Okay to refill? Please advise.

## 2022-09-24 DIAGNOSIS — Z951 Presence of aortocoronary bypass graft: Secondary | ICD-10-CM | POA: Diagnosis not present

## 2022-09-24 DIAGNOSIS — Z0000 Encounter for general adult medical examination without abnormal findings: Secondary | ICD-10-CM | POA: Diagnosis not present

## 2022-09-24 DIAGNOSIS — R7303 Prediabetes: Secondary | ICD-10-CM | POA: Diagnosis not present

## 2022-09-24 DIAGNOSIS — I10 Essential (primary) hypertension: Secondary | ICD-10-CM | POA: Diagnosis not present

## 2022-09-24 DIAGNOSIS — E7800 Pure hypercholesterolemia, unspecified: Secondary | ICD-10-CM | POA: Diagnosis not present

## 2022-09-24 DIAGNOSIS — J069 Acute upper respiratory infection, unspecified: Secondary | ICD-10-CM | POA: Diagnosis not present

## 2022-09-24 DIAGNOSIS — I2510 Atherosclerotic heart disease of native coronary artery without angina pectoris: Secondary | ICD-10-CM | POA: Diagnosis not present

## 2022-09-25 ENCOUNTER — Other Ambulatory Visit: Payer: Self-pay

## 2022-09-25 DIAGNOSIS — I214 Non-ST elevation (NSTEMI) myocardial infarction: Secondary | ICD-10-CM

## 2022-09-25 DIAGNOSIS — R002 Palpitations: Secondary | ICD-10-CM

## 2022-09-25 DIAGNOSIS — I2510 Atherosclerotic heart disease of native coronary artery without angina pectoris: Secondary | ICD-10-CM

## 2022-09-25 MED ORDER — METOPROLOL SUCCINATE ER 25 MG PO TB24: 25.0000 mg | ORAL_TABLET | Freq: Every day | ORAL | 3 refills | Status: DC

## 2022-09-25 NOTE — Progress Notes (Signed)
Refills per Dr. Radford Pax.

## 2022-11-20 ENCOUNTER — Encounter: Payer: Self-pay | Admitting: Cardiology

## 2022-11-20 ENCOUNTER — Ambulatory Visit: Payer: PPO | Attending: Cardiology | Admitting: Cardiology

## 2022-11-20 VITALS — BP 120/66 | HR 54 | Ht 68.0 in | Wt 180.2 lb

## 2022-11-20 DIAGNOSIS — E785 Hyperlipidemia, unspecified: Secondary | ICD-10-CM | POA: Diagnosis not present

## 2022-11-20 DIAGNOSIS — I10 Essential (primary) hypertension: Secondary | ICD-10-CM

## 2022-11-20 DIAGNOSIS — I2510 Atherosclerotic heart disease of native coronary artery without angina pectoris: Secondary | ICD-10-CM

## 2022-11-20 NOTE — Progress Notes (Addendum)
Cardiology Office Note:    Date:  11/20/2022   ID:  Jason Lewis, DOB 1950/10/01, MRN [id number]  PCP:  Merri Brunette, MD  Cardiologist:  Armanda Magic, MD    Referring MD: Merri Brunette, MD   No chief complaint on file.   History of Present Illness:    Jason Lewis is a 72 y.o. 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   He had been on high-dose atorvastatin but developed myalgias and his Lipitor was decreased to 40 mg daily.  He had a nuclear stress test 09/2018 for preop clearance for knee surgery and this was normal.   He is here today for followup and is doing well.  He denies any chest pain or pressure, SOB, DOE, PND, orthopnea, LE edema, dizziness, palpitations or syncope. He is compliant with his meds and is tolerating meds with no SE.    Past Medical History:  Diagnosis Date   Arthritis    CAD in native artery    a.  CABG x 4 utilizing LIMA ot LAD, SVG to Ramus, SVG to Diagonal #2, and SVG to PDA on 05/19/17.   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 A1C 5.7 in 04/2017.   Hyperlipidemia    Medical history non-contributory    Non-ST elevation (NSTEMI) myocardial infarction     Past Surgical History:  Procedure Laterality Date   CARDIAC CATHETERIZATION  05/13/2017   Dr Mendel Ryder 111   COLONOSCOPY     CORONARY ARTERY BYPASS GRAFT N/A 05/19/2017   Procedure: CORONARY ARTERY BYPASS GRAFTING (CABG) x 4;  Surgeon: Kerin Perna, MD;  Location: [facility];  Service: Open Heart Surgery;  Laterality: N/A;   HERNIA REPAIR     KNEE ARTHROSCOPY WITH SUBCHONDROPLASTY Left 01/13/2019   Procedure: Left knee arthroscopic partial medial and lateral meniscectomies with medial tibial and medial femoral condyle subchondroplasty;  Surgeon: Yolonda Kida, MD;  Location: [facility];  Service: Orthopedics;  Laterality:  Left;   KNEE SURGERY Left 2005   LEFT HEART CATH AND CORONARY ANGIOGRAPHY N/A 05/13/2017   Procedure: LEFT HEART CATH AND CORONARY ANGIOGRAPHY;  Surgeon: Lyn Records, MD;  Location: [facility];  Service: Cardiovascular;  Laterality: N/A;   TEE WITHOUT CARDIOVERSION N/A 05/19/2017   Procedure: TRANSESOPHAGEAL ECHOCARDIOGRAM (TEE);  Surgeon: Donata Clay, Theron Arista, MD;  Location: [facility] OR;  Service: Open Heart Surgery;  Laterality: N/A;   TONSILLECTOMY     TOTAL KNEE ARTHROPLASTY Left 11/08/2019   Procedure: TOTAL KNEE ARTHROPLASTY;  Surgeon: Ollen Gross, MD;  Location: [facility];  Service: Orthopedics;  Laterality: Left;    ULTRASOUND GUIDANCE FOR VASCULAR ACCESS  05/13/2017   Procedure: Ultrasound Guidance For Vascular Access;  Surgeon: Lyn Records, MD;  Location: [facility] INVASIVE [facility];  Service: Cardiovascular;;    Current Medications: Current Meds  Medication Sig   aspirin EC 81 MG tablet Take 1 tablet (81 mg total) by mouth daily. Swallow whole.   metoprolol succinate (TOPROL XL) 25 MG 24 hr tablet Take 1 tablet (25 mg total) by mouth daily.   sertraline (ZOLOFT) 50 MG tablet Take 50 mg by mouth daily.     Allergies:   Patient has no known allergies.   Social History   Socioeconomic History  Marital status: Married    Spouse name: Not on file   Number of children: Not on file   Years of education: Not on file   Highest education level: Not on file  Occupational History   Occupation: Painter  Tobacco Use   Smoking status: Never   Smokeless tobacco: Never  Vaping Use   Vaping Use: Never used  Substance and Sexual Activity   Alcohol use: Yes    Alcohol/week: 7.0 standard drinks of alcohol    Types: 7 Glasses of wine per week   Drug use: No   Sexual activity: Not on file  Other Topics Concern   Not on file  Social History Narrative   Not on file   Social Determinants of Health   Financial Resource Strain: Not on file  Food Insecurity: Not on file   Transportation Needs: No Transportation Needs (06/24/2017)   PRAPARE - Administrator, Civil Service (Medical): No    Lack of Transportation (Non-Medical): No  Physical Activity: Inactive (06/24/2017)   Exercise Vital Sign    Days of Exercise per Week: 0 days    Minutes of Exercise per Session: 0 min  Stress: No Stress Concern Present (06/24/2017)   [employer] of Occupational Health - Occupational Stress Questionnaire    Feeling of Stress : Only a little  Social Connections: Unknown (06/24/2017)   Social Connection and Isolation Panel [NHANES]    Frequency of Communication with Friends and Family: Not on file    Frequency of Social Gatherings with Friends and Family: Not on file    Attends Religious Services: Not on file    Active Member of Clubs or Organizations: Not on file    Attends Banker Meetings: Not on file    Marital Status: Married     Family History: [name] family history includes Angina in his father; Coronary artery disease in his brother.  ROS:   Please see the history of present illness.    ROS  All other systems reviewed and negative.   EKGs/Labs/Other Studies Reviewed:    The following studies were reviewed today: Nuclear stress test, outside labs on [facility]  EKG:  EKG is  ordered today.  The ekg ordered today demonstrates sinus bradycardia with iRBBB  Recent Labs: No results found for requested labs within last 365 days.   Recent Lipid Panel    Component Value Date/Time   CHOL 199 09/21/2019 0733   TRIG 158 (H) 09/21/2019 0733   HDL 42 09/21/2019 0733   CHOLHDL 4.7 09/21/2019 0733   CHOLHDL 4.8 05/12/2017 1832   VLDL 25 05/12/2017 1832   LDLCALC 129 (H) 09/21/2019 0733    Physical Exam:    VS:  BP 120/66   Pulse (!) 54   Ht  (1.727 m)   Wt 180 lb 3.2 oz (81.7 kg)   SpO2 98%   BMI 27.40 kg/m     Wt Readings from Last 3 Encounters:  11/20/22 180 lb 3.2 oz (81.7 kg)  09/26/21 182 lb (82.6 kg)   09/21/20 178 lb 3.2 oz (80.8 kg)    GEN: Well nourished, well developed in no acute distress HEENT: Normal NECK: No JVD; No carotid bruits LYMPHATICS: No lymphadenopathy CARDIAC:RRR, no murmurs, rubs, gallops RESPIRATORY:  Clear to auscultation without rales, wheezing or rhonchi  ABDOMEN: Soft, non-tender, non-distended MUSCULOSKELETAL:  No edema; No deformity  SKIN: Warm and dry NEUROLOGIC:  Alert and oriented x 3 PSYCHIATRIC:  Normal affect  ASSESSMENT:    1. Coronary artery disease involving native coronary artery of native heart without angina pectoris   2. Essential hypertension   3. Hyperlipidemia LDL goal <70     PLAN:    In order of problems listed above:  1.  ASCAD -s/p NSTEMI followed by CABG x4 with LIMA to the LAD, SVG to the ramus, SVG to the diagonal 2, SVG to PDA 05/19/17.  -He denies any anginal symptoms since I saw him last -continue prescription drug management with ASA  daily, Toprol XL  daily with PRN refills -statin intolerant  2.  HTN -BP controlled on exam today -continue prescription drug management with Toprol XL  daily   3.  HLD -LDL goal < 70 -I will get a copy of last FLP and ALT from PCP -he is off statin due to intolerance -I will refer him to lipid clinic to discuss PCSKi     Medication Adjustments/Labs and Tests Ordered: Current medicines are reviewed at length with the patient today.  Concerns regarding medicines are outlined above.  No orders of the defined types were placed in this encounter.  No orders of the defined types were placed in this encounter.   Signed, Armanda Magic, MD  11/20/2022 8:03 AM    [facility]

## 2022-11-20 NOTE — Patient Instructions (Signed)
Medication Instructions:  Your physician recommends that you continue on your current medications as directed. Please refer to the Current Medication list given to you today.  *If you need a refill on your cardiac medications before your next appointment, please call your pharmacy*   Lab Work: None.  If you have labs (blood work) drawn today and your tests are completely normal, you will receive your results only by: MyChart Message (if you have MyChart) OR A paper copy in the mail If you have any lab test that is abnormal or we need to change your treatment, we will call you to review the results.   Testing/Procedures: None.   Follow-Up:[facility], you and your health needs are our priority.  As part of our continuing mission to provide you with exceptional heart care, we have created designated Provider Care Teams.  These Care Teams include your primary Cardiologist (physician) and Advanced Practice Providers (APPs -  Physician Assistants and Nurse Practitioners) who all work together to provide you with the care you need, when you need it.  We recommend signing up for the patient portal called "MyChart".  Sign up information is provided on this After Visit Summary.  MyChart is used to connect with patients for Virtual Visits (Telemedicine).  Patients are able to view lab/test results, encounter notes, upcoming appointments, etc.  Non-urgent messages can be sent to your provider as well.   To learn more about what you can do with MyChart, go to [email or web address].    Your next appointment:   1 year(s)  Provider:   Armanda Magic, MD     Other Instructions You have been referred to our lipid clinic to discuss non-statin options for controlling your cholesterol. Someone will call you to set up an appointment.

## 2022-11-20 NOTE — Addendum Note (Signed)
Addended by: Luellen Pucker on: 11/20/2022 08:27 AM   Modules accepted: Orders

## 2022-12-17 ENCOUNTER — Ambulatory Visit: Payer: PPO | Attending: Cardiology

## 2022-12-17 NOTE — Progress Notes (Deleted)
Patient ID: Jason Lewis                 DOB: 1951-01-07                    MRN: [id number]      HPI: Jason Lewis is a 72 y.o. male patient referred to lipid clinic by Dr. Mayford Knife. PMH is significant for hypertension, HLD, CAD status post NSTEMI followed by CABG x4 with LIMA to the LAD, SVG to the ramus, SVG to the diagonal 2, SVG to PDA 05/19/17.  He had been on high-dose atorvastatin but developed myalgias and his Lipitor was decreased to 40 mg daily and then eventually stopped.  Health team advantage Other statins?  Reviewed options for lowering LDL cholesterol, including ezetimibe, PCSK-9 inhibitors, bempedoic acid and inclisiran.  Discussed mechanisms of action, dosing, side effects and potential decreases in LDL cholesterol.  Also reviewed cost information and potential options for patient assistance.   Current Medications:  Intolerances:  Risk Factors: CAD (s/p CABG), age, HTN LDL-C goal: <70 ApoB goal: <80  Diet:   Exercise:   Family History:   Social History:   Labs: 09/23/22 TC 194, HDL 42, LDL-C 125, TG 134 Lipid Panel     Component Value Date/Time   CHOL 199 09/21/2019 0733   TRIG 158 (H) 09/21/2019 0733   HDL 42 09/21/2019 0733   CHOLHDL 4.7 09/21/2019 0733   CHOLHDL 4.8 05/12/2017 1832   VLDL 25 05/12/2017 1832   LDLCALC 129 (H) 09/21/2019 0733   LABVLDL 28 09/21/2019 0733    Past Medical History:  Diagnosis Date   Arthritis    CAD in native artery    a.  CABG x 4 utilizing LIMA ot LAD, SVG to Ramus, SVG to Diagonal #2, and SVG to PDA on 05/19/17.   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 A1C 5.7 in 04/2017.   Hyperlipidemia    Medical history non-contributory    Non-ST elevation (NSTEMI) myocardial infarction [facility])     Current Outpatient Medications on File Prior to Visit  Medication Sig Dispense Refill   aspirin EC 81 MG tablet Take 1 tablet (81 mg total) by mouth daily. Swallow whole. 90 tablet 3   metoprolol  succinate (TOPROL XL) 25 MG 24 hr tablet Take 1 tablet (25 mg total) by mouth daily. 90 tablet 3   sertraline (ZOLOFT) 50 MG tablet Take 50 mg by mouth daily.     No current facility-administered medications on file prior to visit.    No Known Allergies  Assessment/Plan:  1. Hyperlipidemia -  No problem-specific Assessment & Plan notes found for this encounter.    Thank you,  Olene Floss, Pharm.D, BCPS, CPP [facility] Division of [facility] [address] [place], [place] [address]  Phone: [phone]; Fax: [phone]

## 2022-12-18 ENCOUNTER — Encounter: Payer: Self-pay | Admitting: Cardiology

## 2023-04-02 DIAGNOSIS — M1711 Unilateral primary osteoarthritis, right knee: Secondary | ICD-10-CM | POA: Diagnosis not present

## 2023-04-25 DIAGNOSIS — R21 Rash and other nonspecific skin eruption: Secondary | ICD-10-CM | POA: Diagnosis not present

## 2023-09-01 DIAGNOSIS — G5601 Carpal tunnel syndrome, right upper limb: Secondary | ICD-10-CM | POA: Diagnosis not present

## 2023-09-01 DIAGNOSIS — G5603 Carpal tunnel syndrome, bilateral upper limbs: Secondary | ICD-10-CM | POA: Diagnosis not present

## 2023-09-01 DIAGNOSIS — G5602 Carpal tunnel syndrome, left upper limb: Secondary | ICD-10-CM | POA: Diagnosis not present

## 2023-10-20 DIAGNOSIS — G5603 Carpal tunnel syndrome, bilateral upper limbs: Secondary | ICD-10-CM | POA: Diagnosis not present

## 2023-12-01 ENCOUNTER — Encounter: Payer: Self-pay | Admitting: Cardiology

## 2023-12-01 ENCOUNTER — Ambulatory Visit

## 2023-12-01 ENCOUNTER — Ambulatory Visit: Payer: PPO | Attending: Cardiology | Admitting: Cardiology

## 2023-12-01 VITALS — BP 140/80 | HR 51 | Ht 68.0 in | Wt 180.8 lb

## 2023-12-01 DIAGNOSIS — I10 Essential (primary) hypertension: Secondary | ICD-10-CM | POA: Diagnosis not present

## 2023-12-01 DIAGNOSIS — R001 Bradycardia, unspecified: Secondary | ICD-10-CM | POA: Diagnosis not present

## 2023-12-01 DIAGNOSIS — R0602 Shortness of breath: Secondary | ICD-10-CM

## 2023-12-01 DIAGNOSIS — E785 Hyperlipidemia, unspecified: Secondary | ICD-10-CM

## 2023-12-01 DIAGNOSIS — Z0000 Encounter for general adult medical examination without abnormal findings: Secondary | ICD-10-CM | POA: Diagnosis not present

## 2023-12-01 DIAGNOSIS — E7800 Pure hypercholesterolemia, unspecified: Secondary | ICD-10-CM | POA: Diagnosis not present

## 2023-12-01 DIAGNOSIS — I2510 Atherosclerotic heart disease of native coronary artery without angina pectoris: Secondary | ICD-10-CM | POA: Diagnosis not present

## 2023-12-01 LAB — LAB REPORT - SCANNED
A1c: 5.7
EGFR: 89

## 2023-12-01 NOTE — Progress Notes (Unsigned)
 Enrolled patient for a 14 day Zio XT  monitor to be mailed to patients home

## 2023-12-01 NOTE — Patient Instructions (Addendum)
 Medication Instructions:  Your physician recommends that you continue on your current medications as directed. Please refer to the Current Medication list given to you today.  *If you need a refill on your cardiac medications before your next appointment, please call your pharmacy*  Lab Work: TSH today If you have labs (blood work) drawn today and your tests are completely normal, you will receive your results only by: MyChart Message (if you have MyChart) OR A paper copy in the mail If you have any lab test that is abnormal or we need to change your treatment, we will call you to review the results.  Testing/Procedures: PET/CT Stress Test  14 Day Zio Heart Monitor Your physician has requested that you wear a Zio heart monitor for 14 days. This will be mailed to your home with instructions on how to apply the monitor and how to return it when finished. Please allow 2 weeks after returning the heart monitor before our office calls you with the results.   Follow-Up: At [facility], you and your health needs are our priority.  As part of our continuing mission to provide you with exceptional heart care, our providers are all part of one team.  This team includes your primary Cardiologist (physician) and Advanced Practice Providers or APPs (Physician Assistants and Nurse Practitioners) who all work together to provide you with the care you need, when you need it.  Your next appointment:   1 year  Provider:   Micael Adas, MD  We recommend signing up for the patient portal called "MyChart".  Sign up information is provided on this After Visit Summary.  MyChart is used to connect with patients for Virtual Visits (Telemedicine).  Patients are able to view lab/test results, encounter notes, upcoming appointments, etc.  Non-urgent messages can be sent to your provider as well.   To learn more about what you can do with MyChart, go to [email or web address].   Other Instructions You have  been referred to our pharmacist for help with lipid management. Someone will be reaching out to make an appointment.       Please report to Radiology at the [facility] Main Entrance 30 minutes early for your test.  [address] [place], [place] [address]                         OR   Please report to Radiology at [facility] Main Entrance, medical mall, 30 mins prior to your test.  [address]  [place], [place]  How to Prepare for Your Cardiac PET/CT Stress Test:  Nothing to eat or drink, except water , 3 hours prior to arrival time.  NO caffeine/decaffeinated products, or chocolate 12 hours prior to arrival. (Please note decaffeinated beverages (teas/coffees) still contain caffeine).  If you have caffeine within 12 hours prior, the test will need to be rescheduled.  Medication instructions: Do not take erectile dysfunction medications for 72 hours prior to test (sildenafil, tadalafil) Do not take nitrates (isosorbide mononitrate, Ranexa) the day before or day of test Do not take tamsulosin the day before or morning of test Hold theophylline containing medications for 12 hours. Hold Dipyridamole 48 hours prior to the test.  Diabetic Preparation: If able to eat breakfast prior to 3 hour fasting, you may take all medications, including your insulin . Do not worry if you miss your breakfast dose of insulin  - start at your next meal. If you do not  eat prior to 3 hour fast-Hold all diabetes (oral and insulin ) medications. Patients who wear a continuous glucose monitor MUST remove the device prior to scanning.  You may take your remaining medications with water .  NO perfume, cologne or lotion on chest or abdomen area. FEMALES - Please avoid wearing dresses to this appointment.  Total time is 1 to 2 hours; you may want to bring reading material for the waiting time.  IF YOU THINK YOU MAY BE PREGNANT, OR ARE NURSING PLEASE INFORM THE  TECHNOLOGIST.  In preparation for your appointment, medication and supplies will be purchased.  Appointment availability is limited, so if you need to cancel or reschedule, please call the Radiology Department Scheduler at [phone] 24 hours in advance to avoid a cancellation fee of $100.00  What to Expect When you Arrive:  Once you arrive and check in for your appointment, you will be taken to a preparation room within the Radiology Department.  A technologist or Nurse will obtain your medical history, verify that you are correctly prepped for the exam, and explain the procedure.  Afterwards, an IV will be started in your arm and electrodes will be placed on your skin for EKG monitoring during the stress portion of the exam. Then you will be escorted to the PET/CT scanner.  There, staff will get you positioned on the scanner and obtain a blood pressure and EKG.  During the exam, you will continue to be connected to the EKG and blood pressure machines.  A small, safe amount of a radioactive tracer will be injected in your IV to obtain a series of pictures of your heart along with an injection of a stress agent.    After your Exam:  It is recommended that you eat a meal and drink a caffeinated beverage to counter act any effects of the stress agent.  Drink plenty of fluids for the remainder of the day and urinate frequently for the first couple of hours after the exam.  Your doctor will inform you of your test results within 7-10 business days.  For more information and frequently asked questions, please visit our website: [email or web address]  For questions about your test or how to prepare for your test, please call: Cardiac Imaging Nurse Navigators Office: [phone]   ZIO XT- Long Term Monitor Instructions  Your physician has requested you wear a ZIO patch monitor for 14 days.  This is a single patch monitor. [employer] supplies one patch monitor per enrollment.  Additional stickers are not available. Please do not apply patch if you will be having a Nuclear Stress Test,  Echocardiogram, Cardiac CT, MRI, or Chest Xray during the period you would be wearing the  monitor. The patch cannot be worn during these tests. You cannot remove and re-apply the  ZIO XT patch monitor.  Your ZIO patch monitor will be mailed 3 day USPS to your address on file. It may take 3-5 days  to receive your monitor after you have been enrolled.  Once you have received your monitor, please review the enclosed instructions. Your monitor  has already been registered assigning a specific monitor serial # to you.  Billing and Patient Assistance Program Information  We have supplied [employer] with any of your insurance information on file for billing purposes. [employer] offers a sliding scale Patient Assistance Program for patients that do not have  insurance, or whose insurance does not completely cover the cost of the ZIO monitor.  You must apply for the Patient Assistance  Program to qualify for this discounted rate.  To apply, please call [employer] at [phone], select option 4, select option 2, ask to apply for  Patient Assistance Program. Sanna Crystal will ask your household income, and how many people  are in your household. They will quote your out-of-pocket cost based on that information.  [employer] will also be able to set up a 48-month, interest-free payment plan if needed.  Applying the monitor   Shave hair from upper left chest.  Hold abrader disc by orange tab. Rub abrader in 40 strokes over the upper left chest as  indicated in your monitor instructions.  Clean area with 4 enclosed alcohol pads. Let dry.  Apply patch as indicated in monitor instructions. Patch will be placed under collarbone on left  side of chest with arrow pointing upward.  Rub patch adhesive wings for 2 minutes. Remove white label marked "1". Remove the white  label marked "2". Rub patch adhesive wings  for 2 additional minutes.  While looking in a mirror, press and release button in center of patch. A small green light will  flash 3-4 times. This will be your only indicator that the monitor has been turned on.  Do not shower for the first 24 hours. You may shower after the first 24 hours.  Press the button if you feel a symptom. You will hear a small click. Record Date, Time and  Symptom in the Patient Logbook.  When you are ready to remove the patch, follow instructions on the last 2 pages of Patient  Logbook. Stick patch monitor onto the last page of Patient Logbook.  Place Patient Logbook in the blue and white box. Use locking tab on box and tape box closed  securely. The blue and white box has prepaid postage on it. Please place it in the mailbox as  soon as possible. Your physician should have your test results approximately 7 days after the  monitor has been mailed back to [facility].  Call [facility] Customer Care at [phone] if you have questions regarding  your ZIO XT patch monitor. Call them immediately if you see an orange light blinking on your  monitor.  If your monitor falls off in less than 4 days, contact our Monitor department at [phone].  If your monitor becomes loose or falls off after 4 days call [employer] at [phone] for  suggestions on securing your monitor

## 2023-12-01 NOTE — Progress Notes (Signed)
 Cardiology Office Note:    Date:  12/01/2023   ID:  JEANMICHEL FORSELL, DOB [birth year], MRN [id number]  PCP:  Imelda Man, MD  Cardiologist:  Gaylyn Keas, MD    Referring MD: Imelda Man, MD   Chief Complaint  Patient presents with   Coronary Artery Disease   Hypertension   Hyperlipidemia    History of Present Illness:    Jason Lewis is a 73 y.o. 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   He had been on high-dose atorvastatin  but developed myalgias and his Lipitor  was decreased to 40 mg daily.  He had a nuclear stress test 09/2018 for preop clearance for knee surgery and this was normal.   He is here today for followup and is doing well.  He denies any chest pain or pressure,  PND, orthopnea, LE edema, dizziness, palpitations or syncope. He occasionally notices some very mild DOE but not really bothersome.  This is usually when doing very physical things.Aaron Aas  He is compliant with his meds and is tolerating meds with no SE.    Past Medical History:  Diagnosis Date   Arthritis    CAD in native artery    a.  CABG x 4 utilizing LIMA ot LAD, SVG to Ramus, SVG to Diagonal #2, and SVG to PDA on 05/19/17.   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 A1C 5.7 in 04/2017.   Hyperlipidemia    Medical history non-contributory    Non-ST elevation (NSTEMI) myocardial infarction [facility])     Past Surgical History:  Procedure Laterality Date   CARDIAC CATHETERIZATION  05/13/2017   Dr Pauletta Boroughs 111   COLONOSCOPY     CORONARY ARTERY BYPASS GRAFT N/A 05/19/2017   Procedure: CORONARY ARTERY BYPASS GRAFTING (CABG) x 4;  Surgeon: Heriberto London, MD;  Location: [facility] OR;  Service: Open Heart Surgery;  Laterality: N/A;   HERNIA REPAIR     KNEE ARTHROSCOPY WITH SUBCHONDROPLASTY Left 01/13/2019   Procedure: Left knee arthroscopic partial medial and lateral  meniscectomies with medial tibial and medial femoral condyle subchondroplasty;  Surgeon: Janeth Medicus, MD;  Location: [facility];  Service: Orthopedics;  Laterality: Left;   KNEE SURGERY Left 2005   LEFT HEART CATH AND CORONARY ANGIOGRAPHY N/A 05/13/2017   Procedure: LEFT HEART CATH AND CORONARY ANGIOGRAPHY;  Surgeon: Arty Binning, MD;  Location: [facility] INVASIVE CV LAB;  Service: Cardiovascular;  Laterality: N/A;   TEE WITHOUT CARDIOVERSION N/A 05/19/2017   Procedure: TRANSESOPHAGEAL ECHOCARDIOGRAM (TEE);  Surgeon: Matt Song, Donata Fryer, MD;  Location: [facility] OR;  Service: Open Heart Surgery;  Laterality: N/A;   TONSILLECTOMY     TOTAL KNEE ARTHROPLASTY Left 11/08/2019   Procedure: TOTAL KNEE ARTHROPLASTY;  Surgeon: Liliane Rei, MD;  Location: [facility] ORS;  Service: Orthopedics;  Laterality: Left;    ULTRASOUND GUIDANCE FOR VASCULAR ACCESS  05/13/2017   Procedure: Ultrasound Guidance For Vascular Access;  Surgeon: Arty Binning, MD;  Location: [facility] INVASIVE CV LAB;  Service: Cardiovascular;;    Current Medications: Current Meds  Medication Sig   aspirin  EC 81 MG tablet Take 1 tablet (81 mg total) by mouth daily. Swallow whole.   metoprolol  succinate (TOPROL  XL) 25 MG 24 hr tablet Take 1 tablet (25 mg total) by mouth daily.   [  DISCONTINUED] sertraline (ZOLOFT) 50 MG tablet Take 50 mg by mouth daily.     Allergies:   Patient has no known allergies.   Social History   Socioeconomic History   Marital status: Married    Spouse name: Not on file   Number of children: Not on file   Years of education: Not on file   Highest education level: Not on file  Occupational History   Occupation: Education administrator  Tobacco Use   Smoking status: Never   Smokeless tobacco: Never  Vaping Use   Vaping status: Never Used  Substance and Sexual Activity   Alcohol use: Yes    Alcohol/week: 7.0 standard drinks of alcohol    Types: 7 Glasses of wine per week   Drug use: No   Sexual activity:  Not on file  Other Topics Concern   Not on file  Social History Narrative   Not on file   Social Drivers of Health   Financial Resource Strain: Not on file  Food Insecurity: Not on file  Transportation Needs: No Transportation Needs (06/24/2017)   PRAPARE - Administrator, Civil Service (Medical): No    Lack of Transportation (Non-Medical): No  Physical Activity: Inactive (06/24/2017)   Exercise Vital Sign    Days of Exercise per Week: 0 days    Minutes of Exercise per Session: 0 min  Stress: No Stress Concern Present (06/24/2017)   [employer] of Occupational Health - Occupational Stress Questionnaire    Feeling of Stress : Only a little  Social Connections: Unknown (06/24/2017)   Social Connection and Isolation Panel [NHANES]    Frequency of Communication with Friends and Family: Not on file    Frequency of Social Gatherings with Friends and Family: Not on file    Attends Religious Services: Not on file    Active Member of Clubs or Organizations: Not on file    Attends Banker Meetings: Not on file    Marital Status: Married     Family History: [name] family history includes Angina in his father; Coronary artery disease in his brother.  ROS:   Please see the history of present illness.    ROS  All other systems reviewed and negative.   EKGs/Labs/Other Studies Reviewed:    The following studies were reviewed today: Nuclear stress test, outside labs on [facility]  EKG Interpretation Date/Time:  Monday Dec 01 2023 08:04:55 EDT Ventricular Rate:  51 PR Interval:    QRS Duration:  108 QT Interval:  472 QTC Calculation: 435 R Axis:   50  Text Interpretation: Normal sinus rhythm with Junctional rhythm intermittently When compared with ECG of 20-May-2017 06:52, RBBB has resolved Confirmed by Gaylyn Keas [phone]) on 12/01/2023 8:38:14 AMEKG Interpretation Date/Time:  Monday Dec 01 2023 08:04:55 EDT Ventricular Rate:  51 PR Interval:     QRS Duration:  108 QT Interval:  472 QTC Calculation: 435 R Axis:   50  Text Interpretation: Normal sinus rhythm with Junctional rhythm intermittently When compared with ECG of 20-May-2017 06:52, RBBB has resolved Confirmed by Gaylyn Keas ([id number]) on 12/01/2023 8:38:14 AM    Recent Labs: No results found for requested labs within last 365 days.   Recent Lipid Panel    Component Value Date/Time   CHOL 199 09/21/2019 0733   TRIG 158 (H) 09/21/2019 0733   HDL 42 09/21/2019 0733   CHOLHDL 4.7 09/21/2019 0733   CHOLHDL 4.8 05/12/2017 1832   VLDL 25 05/12/2017 1832  LDLCALC 129 (H) 09/21/2019 7829    Physical Exam:    VS:  BP (!) 140/80   Pulse (!) 51   Ht 5\' 8"  (1.727 m)   Wt 180 lb 12.8 oz (82 kg)   SpO2 90%   BMI 27.49 kg/m     Wt Readings from Last 3 Encounters:  12/01/23 180 lb 12.8 oz (82 kg)  11/20/22 180 lb 3.2 oz (81.7 kg)  09/26/21 182 lb (82.6 kg)    GEN: Well nourished, well developed in no acute distress HEENT: Normal NECK: No JVD; No carotid bruits LYMPHATICS: No lymphadenopathy CARDIAC:RRR, no murmurs, rubs, gallops RESPIRATORY:  Clear to auscultation without rales, wheezing or rhonchi  ABDOMEN: Soft, non-tender, non-distended MUSCULOSKELETAL:  No edema; No deformity  SKIN: Warm and dry NEUROLOGIC:  Alert and oriented x 3 PSYCHIATRIC:  Normal affect  ASSESSMENT:    1. Coronary artery disease involving native coronary artery of native heart without angina pectoris   2. Essential hypertension   3. Hyperlipidemia LDL goal <70     PLAN:    In order of problems listed above:  1.  ASCAD -s/p NSTEMI followed by CABG x4 with LIMA to the LAD, SVG to the ramus, SVG to the diagonal 2, SVG to PDA 05/19/17.  -He has not had any CP but has had DOE with harder work -I will get a Stress PET CT to assess for ischemia -Informed Consent   Shared Decision Making/Informed Consent The risks [chest pain, shortness of breath, cardiac arrhythmias, dizziness, blood  pressure fluctuations, myocardial infarction, stroke/transient ischemic attack, nausea, vomiting, allergic reaction, radiation exposure, metallic taste sensation and life-threatening complications (estimated to be 1 in 10,000)], benefits (risk stratification, diagnosing coronary artery disease, treatment guidance) and alternatives of a cardiac PET stress test were discussed in detail with Mr. Deveney and he agrees to proceed.    -Continue prescription drug management with aspirin  81 mg daily and Toprol -XL 25 mg daily -statin intolerant  2.  HTN - BP controlled on exam today - Continue prescription drug management with Toprol -XL 25 mg daily  3.  HLD -LDL goal < 70 -he just had labs check this am by his PCP for lipids and I will get a copy -I will refer him to lipid clinic to discuss PCSKi   4.  Intermiittent Junctional rhythm -noted on EKG today -completely asymptomatic -will get a 2 week ziopatch to assess for AV block and average HR -check TSH  Followup with me in 1 year    Medication Adjustments/Labs and Tests Ordered: Current medicines are reviewed at length with the patient today.  Concerns regarding medicines are outlined above.  Orders Placed This Encounter  Procedures   EKG 12-Lead   No orders of the defined types were placed in this encounter.   Signed, Gaylyn Keas, MD  12/01/2023 8:49 AM    [facility]

## 2023-12-02 LAB — TSH: TSH: 4.25 u[IU]/mL (ref 0.450–4.500)

## 2023-12-11 ENCOUNTER — Ambulatory Visit: Payer: Self-pay

## 2023-12-11 DIAGNOSIS — R0602 Shortness of breath: Secondary | ICD-10-CM

## 2023-12-11 NOTE — Telephone Encounter (Signed)
 Call to patient to advise of normal TSH result, patient verbalizes understanding and agrees to continue current medications.

## 2023-12-15 DIAGNOSIS — R7303 Prediabetes: Secondary | ICD-10-CM | POA: Diagnosis not present

## 2023-12-15 DIAGNOSIS — R972 Elevated prostate specific antigen [PSA]: Secondary | ICD-10-CM | POA: Diagnosis not present

## 2023-12-15 DIAGNOSIS — G720 Drug-induced myopathy: Secondary | ICD-10-CM | POA: Diagnosis not present

## 2023-12-15 DIAGNOSIS — E039 Hypothyroidism, unspecified: Secondary | ICD-10-CM | POA: Diagnosis not present

## 2023-12-15 DIAGNOSIS — F419 Anxiety disorder, unspecified: Secondary | ICD-10-CM | POA: Diagnosis not present

## 2023-12-15 DIAGNOSIS — Z951 Presence of aortocoronary bypass graft: Secondary | ICD-10-CM | POA: Diagnosis not present

## 2023-12-15 DIAGNOSIS — I2510 Atherosclerotic heart disease of native coronary artery without angina pectoris: Secondary | ICD-10-CM | POA: Diagnosis not present

## 2023-12-15 DIAGNOSIS — Z0000 Encounter for general adult medical examination without abnormal findings: Secondary | ICD-10-CM | POA: Diagnosis not present

## 2023-12-15 DIAGNOSIS — E559 Vitamin D deficiency, unspecified: Secondary | ICD-10-CM | POA: Diagnosis not present

## 2023-12-15 DIAGNOSIS — E7800 Pure hypercholesterolemia, unspecified: Secondary | ICD-10-CM | POA: Diagnosis not present

## 2023-12-15 DIAGNOSIS — T466X5A Adverse effect of antihyperlipidemic and antiarteriosclerotic drugs, initial encounter: Secondary | ICD-10-CM | POA: Diagnosis not present

## 2023-12-20 ENCOUNTER — Other Ambulatory Visit: Payer: Self-pay | Admitting: Cardiology

## 2023-12-20 DIAGNOSIS — I2510 Atherosclerotic heart disease of native coronary artery without angina pectoris: Secondary | ICD-10-CM

## 2023-12-20 DIAGNOSIS — R002 Palpitations: Secondary | ICD-10-CM

## 2023-12-20 DIAGNOSIS — I214 Non-ST elevation (NSTEMI) myocardial infarction: Secondary | ICD-10-CM

## 2024-01-20 DIAGNOSIS — E559 Vitamin D deficiency, unspecified: Secondary | ICD-10-CM | POA: Diagnosis not present

## 2024-01-20 DIAGNOSIS — E039 Hypothyroidism, unspecified: Secondary | ICD-10-CM | POA: Diagnosis not present

## 2024-01-23 ENCOUNTER — Ambulatory Visit: Attending: Pharmacist | Admitting: Pharmacist

## 2024-01-23 DIAGNOSIS — E039 Hypothyroidism, unspecified: Secondary | ICD-10-CM | POA: Diagnosis not present

## 2024-01-23 DIAGNOSIS — E559 Vitamin D deficiency, unspecified: Secondary | ICD-10-CM | POA: Diagnosis not present

## 2024-01-23 DIAGNOSIS — F411 Generalized anxiety disorder: Secondary | ICD-10-CM | POA: Diagnosis not present

## 2024-01-23 NOTE — Progress Notes (Deleted)
 Patient ID: MONTRE HARBOR                 DOB: 1950-12-08                    MRN: [id number]      HPI: Jason Lewis is a 73 y.o. male patient referred to lipid clinic by Dr.Turner. PMH is significant for hypertension, HLD, CAD status post NSTEMI followed by CABG x4   He had been on high-dose atorvastatin  but developed myalgias and his Lipitor  was decreased to 40 mg daily   Reviewed options for lowering LDL cholesterol, including ezetimibe, PCSK-9 inhibitors, bempedoic acid and inclisiran.  Discussed mechanisms of action, dosing, side effects and potential decreases in LDL cholesterol.  Also reviewed cost information and potential options for patient assistance.  Current Medications: none  Intolerances:  Risk Factors: hypertension, HLD, CAD status post NSTEMI followed by CABG x4  LDL goal: <70 Last lab: LDL 120, TC 182, TG 96, HDL 44  Diet:   Exercise:   Family History:   Relation Problem Comments  Mother (Deceased)   Father (Deceased) Angina     Brother Coronary artery disease s/p CABG    Maternal Grandmother (Deceased)   Maternal Grandfather (Deceased)   Paternal Grandmother (Deceased)   Paternal Grandfather (Deceased)     Social History:   Labs:  Lipid Panel     Component Value Date/Time   CHOL 199 09/21/2019 0733   TRIG 158 (H) 09/21/2019 0733   HDL 42 09/21/2019 0733   CHOLHDL 4.7 09/21/2019 0733   CHOLHDL 4.8 05/12/2017 1832   VLDL 25 05/12/2017 1832   LDLCALC 129 (H) 09/21/2019 0733   LABVLDL 28 09/21/2019 0733    Past Medical History:  Diagnosis Date   Arthritis    CAD in native artery    a.  CABG x 4 utilizing LIMA ot LAD, SVG to Ramus, SVG to Diagonal #2, and SVG to PDA on 05/19/17.   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 A1C 5.7 in 04/2017.   Hyperlipidemia    Medical history non-contributory    Non-ST elevation (NSTEMI) myocardial infarction [facility])     Current Outpatient Medications on File Prior to Visit   Medication Sig Dispense Refill   aspirin  EC 81 MG tablet Take 1 tablet (81 mg total) by mouth daily. Swallow whole. 90 tablet 3   metoprolol  succinate (TOPROL -XL) 25 MG 24 hr tablet Take 1 tablet by mouth once daily 90 tablet 3   No current facility-administered medications on file prior to visit.    No Known Allergies  Assessment/Plan:  1. Hyperlipidemia -  No problems updated. No problem-specific Assessment & Plan notes found for this encounter.    Thank you,  Robbi Blanch, Pharm.D [place]. [facility] [address], [place], [place] [address] Phone: [phone]; Fax: [phone]

## 2024-02-02 DIAGNOSIS — M25512 Pain in left shoulder: Secondary | ICD-10-CM | POA: Diagnosis not present

## 2024-03-03 ENCOUNTER — Ambulatory Visit (HOSPITAL_COMMUNITY): Admission: RE | Admit: 2024-03-03 | Source: Ambulatory Visit

## 2024-03-22 ENCOUNTER — Encounter (HOSPITAL_COMMUNITY): Payer: Self-pay

## 2024-03-23 ENCOUNTER — Telehealth (HOSPITAL_COMMUNITY): Payer: Self-pay | Admitting: Emergency Medicine

## 2024-03-23 NOTE — Telephone Encounter (Signed)
 Reaching out to patient to offer assistance regarding upcoming cardiac imaging study; pt verbalizes understanding of appt date/time, parking situation and where to check in, pre-test NPO status and medications ordered, and verified current allergies; name and call back number provided for further questions should they arise Rockwell Alexandria RN Navigator Cardiac Imaging [facility] [phone] office [phone] cell

## 2024-03-24 ENCOUNTER — Ambulatory Visit (HOSPITAL_COMMUNITY)
Admission: RE | Admit: 2024-03-24 | Discharge: 2024-03-24 | Disposition: A | Discharge status: Home / Self Care | Source: Ambulatory Visit | Attending: Cardiology | Admitting: Cardiology

## 2024-03-24 DIAGNOSIS — R0602 Shortness of breath: Secondary | ICD-10-CM | POA: Insufficient documentation

## 2024-03-24 LAB — NM PET CT CARDIAC PERFUSION MULTI W/ABSOLUTE BLOODFLOW
MBFR: 2.55
Nuc Rest EF: 44 %
Nuc Stress EF: 58 %
Rest MBF: 0.69 ml/g/min
Rest Nuclear Isotope Dose: 21.1 mCi
ST Depression (mm): 1 mm
Stress MBF: 1.76 ml/g/min
Stress Nuclear Isotope Dose: 21.6 mCi

## 2024-03-24 MED ORDER — RUBIDIUM RB82 GENERATOR (RUBYFILL)
21.1000 | PACK | Freq: Once | INTRAVENOUS | Status: AC
  Administered 2024-03-24: 21.1 via INTRAVENOUS

## 2024-03-24 MED ORDER — RUBIDIUM RB82 GENERATOR (RUBYFILL)
21.5700 | PACK | Freq: Once | INTRAVENOUS | Status: AC
  Administered 2024-03-24: 21.57 via INTRAVENOUS

## 2024-03-24 MED ORDER — REGADENOSON 0.4 MG/5ML IV SOLN
INTRAVENOUS | Status: AC
  Filled 2024-03-24: qty 5

## 2024-03-24 MED ORDER — REGADENOSON 0.4 MG/5ML IV SOLN
0.4000 mg | Freq: Once | INTRAVENOUS | Status: AC
  Administered 2024-03-24: 0.4 mg via INTRAVENOUS

## 2024-03-25 ENCOUNTER — Ambulatory Visit: Payer: Self-pay

## 2024-03-25 ENCOUNTER — Other Ambulatory Visit: Payer: Self-pay

## 2024-03-25 DIAGNOSIS — E785 Hyperlipidemia, unspecified: Secondary | ICD-10-CM

## 2024-03-25 DIAGNOSIS — I2510 Atherosclerotic heart disease of native coronary artery without angina pectoris: Secondary | ICD-10-CM

## 2024-03-30 NOTE — Telephone Encounter (Signed)
 Call to patient to discuss stress test results. Spoke with spouse [facility]) regarding abnormal nuclear stress test worrisome for ischemia in the LAD. Set appt w/ APP for 04/02/24. Also ordered 2D echo to assess LV function which was abnormal on stress test

## 2024-03-30 NOTE — Telephone Encounter (Signed)
-----   Message from Wilbert Bihari sent at 03/29/2024  8:01 PM EDT ----- Abnormal nuclear stress test worrisome for ischemia in the LAD.  I think he needs to come in to see one of the extenders to get set up for cardiac catheterization.  I would like him to have a right  and left cardiac cath because of his shortness of breath as well.  Please get a 2D echo to assess LV function which was abnormal on stress test ----- Message ----- From: Interface, Rad Results In Sent: 03/24/2024   9:51 AM EDT To: Wilbert JONELLE Bihari, MD

## 2024-03-30 NOTE — Addendum Note (Signed)
 Addended by: JANIT GENI CROME on: 03/30/2024 01:25 PM   Modules accepted: Orders

## 2024-04-02 ENCOUNTER — Ambulatory Visit: Attending: Physician Assistant | Admitting: Physician Assistant

## 2024-04-02 ENCOUNTER — Encounter: Payer: Self-pay | Admitting: Physician Assistant

## 2024-04-02 ENCOUNTER — Other Ambulatory Visit: Payer: Self-pay | Admitting: Physician Assistant

## 2024-04-02 VITALS — BP 112/64 | HR 57 | Ht 67.5 in | Wt 181.6 lb

## 2024-04-02 DIAGNOSIS — I214 Non-ST elevation (NSTEMI) myocardial infarction: Secondary | ICD-10-CM

## 2024-04-02 DIAGNOSIS — R002 Palpitations: Secondary | ICD-10-CM

## 2024-04-02 DIAGNOSIS — R001 Bradycardia, unspecified: Secondary | ICD-10-CM

## 2024-04-02 DIAGNOSIS — I2510 Atherosclerotic heart disease of native coronary artery without angina pectoris: Secondary | ICD-10-CM

## 2024-04-02 DIAGNOSIS — E785 Hyperlipidemia, unspecified: Secondary | ICD-10-CM | POA: Diagnosis not present

## 2024-04-02 DIAGNOSIS — R0609 Other forms of dyspnea: Secondary | ICD-10-CM

## 2024-04-02 MED ORDER — NITROGLYCERIN 0.4 MG SL SUBL: 0.4000 mg | SUBLINGUAL_TABLET | SUBLINGUAL | 3 refills | Status: AC | PRN

## 2024-04-02 NOTE — Patient Instructions (Signed)
 Medication Instructions:  Your physician recommends that you continue on your current medications as directed. Please refer to the Current Medication list given to you today.  *If you need a refill on your cardiac medications before your next appointment, please call your pharmacy*  Lab Work: TODAY-BMP, CBC If you have labs (blood work) drawn today and your tests are completely normal, you will receive your results only by: MyChart Message (if you have MyChart) OR A paper copy in the mail If you have any lab test that is abnormal or we need to change your treatment, we will call you to review the results.  Testing/Procedures:  [place] [facility] A DEPT OF [place] AT [address] A DEPT OF THE [place] [address] [place] [address] Dept: [phone] Loc: [phone]  BURNETTE SAUTTER  04/02/2024  You are scheduled for a Cardiac Catheterization on Thursday, September 11 with Dr. Ozell Fell.  1. Please arrive at the [facility] (Main Entrance A) at [facility]: [address] [place], [place] [address] at 11:30 AM (This time is 2 hour(s) before your procedure to ensure your preparation).   Free valet parking service is available. You will check in at ADMITTING. The support person will be asked to wait in the waiting room.  It is OK to have someone drop you off and come back when you are ready to be discharged.    Special note: Every effort is made to have your procedure done on time. Please understand that emergencies sometimes delay scheduled procedures.  2. Diet: Nothing to eat after midnight.   3. Hydration: You need to be well hydrated before your procedure. On September 11, you may drink approved liquids (see below) until 2 hours before the procedure, with 16 oz of water  as your last intake.   List of approved liquids water , clear juice, clear tea, black coffee, fruit juices, non-citric and without pulp, carbonated  beverages, Gatorade, Kool -Aid, plain Jello-O and plain ice popsicles.  4. Labs: You will need to have blood drawn on Monday, September 8 at [facility] (1st Floor), [address] [place], [place] [address]. You do not need to be fasting.  5. Medication instructions in preparation for your procedure:   Contrast Allergy: No  On the morning of your procedure, take your Aspirin  81 mg and any morning medicines NOT listed above.  You may use sips of water .  6. Plan to go home the same day, you will only stay overnight if medically necessary. 7. Bring a current list of your medications and current insurance cards. 8. You MUST have a responsible person to drive you home. 9. Someone MUST be with you the first 24 hours after you arrive home or your discharge will be delayed. 10. Please wear clothes that are easy to get on and off and wear slip-on shoes.  Thank you for allowing us  to care for you!   -- [facility]   Follow-Up: FOLLOW UP WILL BE DETERMINED AFTER CATH

## 2024-04-02 NOTE — Progress Notes (Signed)
 Cardiology Office Note   Date:  04/02/2024  ID:  Jason Lewis, DOB [birth year], MRN [id number] PCP: Clarice Nottingham, MD  [facility] Providers Cardiologist:  Wilbert Bihari, MD   History of Present Illness Jason Lewis is a 73 y.o. male with a past medical history of hypertension, HLD, CAD status post NSTEMI followed by CABG x 4 with LIMA to LAD, SVG to ramus, SVG to diagonal 2, SVG to PDA 05/19/2017.  Was last seen by Dr. Bihari May of this year.  Had been on high-dose atorvastatin  but developed myalgias and Lipitor  was decreased to 40 mg daily.  Had a nuclear stress test 09/2018 for preop clearance for knee surgery and this was normal.  At his last follow-up visit he denied any chest pain or pressure, PND, orthopnea, lower extremity edema, dizziness, palpitations or syncope.  Occasionally noted some very mild DOE but not really bothersome.  This is usually when doing really physical things.  Compliant with medications and tolerating without any side effects.  Ultimately at that visit a stress PET/CT was ordered to assess for ischemia since he was having some more DOE with harder work.  On 03/24/2024 a PET/CT showed reversible perfusion defect in the apical anterior/inferior/lateral walls and apex consistent with ischemia.  Myocardial blood flow reserve was overall normal but reduced in the area of the perfusion defect.  LV perfusion was abnormal with evidence of ischemia.  LVEF was noted to be 44%.  Ultimately, will need set up for cardiac catheterization.  Today, he presents with coronary artery disease for evaluation of decreased blood flow to the heart.   A PET CT scan on March 24, 2024, shows decreased blood flow to the anterior, inferior, and lateral apical regions of the heart, with a reversible perfusion defect. He underwent coronary artery bypass grafting in 2018 following a myocardial infarction.  He experiences no significant chest pain or shortness of breath at rest. Mild dyspnea  on exertion occurs during very physical activities but is not bothersome. He occasionally feels 'butterflies' in his chest, possibly palpitations, but denies any chest pain, pressure, or squeezing sensations.  He takes metoprolol  regularly and has not used nitroglycerin  tablets due to lack of symptoms. He previously took baby aspirin  but discontinued it after reading conflicting information. His family history includes heart disease, with his father and brother having had heart attacks in their forties and sixties.  Reports no shortness of breath nor dyspnea on exertion. Reports no chest pain, pressure, or tightness. No edema, orthopnea, PND.  Discussed the use of AI scribe software for clinical note transcription with the patient, who gave verbal consent to proceed.  ROS: pertinent ROS in HPI  Studies Reviewed EKG Interpretation Date/Time:  Friday April 02 2024 15:36:40 EDT Ventricular Rate:  57 PR Interval:  154 QRS Duration:  110 QT Interval:  442 QTC Calculation: 430 R Axis:   44  Text Interpretation: Sinus bradycardia Incomplete right bundle branch block When compared with ECG of 01-Dec-2023 08:04, Sinus rhythm has replaced Junctional rhythm Incomplete right bundle branch block is now Present Confirmed by Lucien Blanc [phone]) on 04/02/2024 5:17:36 PM   PET/CT 03/24/24    Reversible perfusion defect in apical anterior/inferior/lateral walls and apex consistent with ischemia.  Myocardial blood flow reserve is overall normal but reduced in area of perfusion defect (though can be inaccurate in setting of prior CABG).  No high risk findings such as TID or drop in EF with stress.  Overall, study suggests LAD territory  ischemia and is intermediate risk   LV perfusion is abnormal. There is evidence of ischemia. Defect 1: There is a medium defect with moderate reduction in uptake present in the apical anterior, inferior and lateral location(s) that is reversible. There is normal wall motion in  the defect area. Consistent with ischemia.   Rest left ventricular function is abnormal. Rest EF: 44%. Stress left ventricular function is normal. Stress EF: 58%. End diastolic cavity size is normal. End systolic cavity size is normal.   Myocardial blood flow was computed to be 0.63ml/g/min at rest and 1.76ml/g/min at stress. Global myocardial blood flow reserve was 2.55 and was normal.   Coronary calcium  assessment not performed due to prior revascularization.   Findings are consistent with ischemia. The study is intermediate risk.   Electronically signed by Fae Nanas, MD   CLINICAL DATA:  This over-read does not include interpretation of cardiac or coronary anatomy or pathology. The interpretation by the cardiologist is attached.   COMPARISON:  None Available.   FINDINGS: Scout view is grossly unremarkable. Calcified mediastinal and hilar lymph nodes. Calcified granulomas. No acute extracardiac findings.   IMPRESSION: No acute extracardiac findings.     Electronically Signed   By: Newell Eke M.D.   On: 03/24/2024 09:49   Cardiac cath 04/2017  Left Anterior Descending  Vessel is large.        First Diagonal Branch  Vessel is small in size.    Second Diagonal Branch  Vessel is large in size.      Third Diagonal Branch  Vessel is small in size.      Left Circumflex      First Obtuse Marginal Branch      Right Coronary Artery  The lesion is eccentric and thrombotic.    Right Posterior Descending Artery      Right Posterior Atrioventricular Artery      Second Right Posterolateral Branch      Third Right Posterolateral Branch      Intervention   No interventions have been documented.   Wall Motion              Left Heart  Left Ventricle The left ventricular size is normal. The left ventricular systolic function is normal. LV end diastolic pressure is normal. The left ventricular ejection fraction is 55-65% by visual estimate.    Coronary Diagrams  Diagnostic Dominance: Right  Intervention      Physical Exam VS:  BP 112/64   Pulse (!) 57   Ht 5' 7.5 (1.715 m)   Wt 181 lb 9.6 oz (82.4 kg)   SpO2 96%   BMI 28.02 kg/m        Wt Readings from Last 3 Encounters:  04/02/24 181 lb 9.6 oz (82.4 kg)  12/01/23 180 lb 12.8 oz (82 kg)  11/20/22 180 lb 3.2 oz (81.7 kg)    GEN: Well nourished, well developed in no acute distress NECK: No JVD; No carotid bruits CARDIAC: RRR, no murmurs, rubs, gallops RESPIRATORY:  Clear to auscultation without rales, wheezing or rhonchi  ABDOMEN: Soft, non-tender, non-distended EXTREMITIES:  No edema; No deformity   ASSESSMENT AND PLAN  Coronary artery disease with prior CABG and reversible ischemia Reversible ischemia in anterior, inferior, and lateral apical regions suggests potential graft occlusion or blockages in non-bypassed branches. Previous CABG in 2018 addressed LAD, right coronary, and circumflex arteries. No chest pain or significant dyspnea, but decreased stamina and mild exertional dyspnea reported. - Schedule right and left heart  catheterization for next Friday. - Order CBC and BMP within 30 days prior to catheterization. - Ensure EKG is done within 30 days prior to catheterization. - Prescribe nitroglycerin  tablets as needed for symptoms. - Discuss potential need for stenting or balloon angioplasty based on catheterization findings.   Palpitations Intermittent palpitations reported, no extra heartbeats detected. Previous heart monitor not worn due to work constraints. No current symptoms warranting immediate intervention. - Consider heart monitor if palpitations worsen.  Bradycardia Heart rate in fifties, asymptomatic. Current metoprolol  dose is low and well-tolerated. - Continue current metoprolol  dosage.      The patient understands that risks include but are not limited to stroke (1 in 1000), death (1 in 1000), kidney failure [usually temporary] (1  in 500), bleeding (1 in 200), allergic reaction [possibly serious] (1 in 200), and agrees to proceed.      Dispo: He can follow-up after cardiac catheterization  Signed, Orren LOISE Fabry, PA-C

## 2024-04-02 NOTE — H&P (View-Only) (Signed)
 Cardiology Office Note   Date:  04/02/2024  ID:  Jason Lewis, DOB [birth year], MRN [id number] PCP: Clarice Nottingham, MD  [facility] Providers Cardiologist:  Wilbert Bihari, MD   History of Present Illness Jason Lewis is a 73 y.o. male with a past medical history of hypertension, HLD, CAD status post NSTEMI followed by CABG x 4 with LIMA to LAD, SVG to ramus, SVG to diagonal 2, SVG to PDA 05/19/2017.  Was last seen by Dr. Bihari May of this year.  Had been on high-dose atorvastatin  but developed myalgias and Lipitor  was decreased to 40 mg daily.  Had a nuclear stress test 09/2018 for preop clearance for knee surgery and this was normal.  At his last follow-up visit he denied any chest pain or pressure, PND, orthopnea, lower extremity edema, dizziness, palpitations or syncope.  Occasionally noted some very mild DOE but not really bothersome.  This is usually when doing really physical things.  Compliant with medications and tolerating without any side effects.  Ultimately at that visit a stress PET/CT was ordered to assess for ischemia since he was having some more DOE with harder work.  On 03/24/2024 a PET/CT showed reversible perfusion defect in the apical anterior/inferior/lateral walls and apex consistent with ischemia.  Myocardial blood flow reserve was overall normal but reduced in the area of the perfusion defect.  LV perfusion was abnormal with evidence of ischemia.  LVEF was noted to be 44%.  Ultimately, will need set up for cardiac catheterization.  Today, he presents with coronary artery disease for evaluation of decreased blood flow to the heart.   A PET CT scan on March 24, 2024, shows decreased blood flow to the anterior, inferior, and lateral apical regions of the heart, with a reversible perfusion defect. He underwent coronary artery bypass grafting in 2018 following a myocardial infarction.  He experiences no significant chest pain or shortness of breath at rest. Mild dyspnea  on exertion occurs during very physical activities but is not bothersome. He occasionally feels 'butterflies' in his chest, possibly palpitations, but denies any chest pain, pressure, or squeezing sensations.  He takes metoprolol  regularly and has not used nitroglycerin  tablets due to lack of symptoms. He previously took baby aspirin  but discontinued it after reading conflicting information. His family history includes heart disease, with his father and brother having had heart attacks in their forties and sixties.  Reports no shortness of breath nor dyspnea on exertion. Reports no chest pain, pressure, or tightness. No edema, orthopnea, PND.  Discussed the use of AI scribe software for clinical note transcription with the patient, who gave verbal consent to proceed.  ROS: pertinent ROS in HPI  Studies Reviewed EKG Interpretation Date/Time:  Friday April 02 2024 15:36:40 EDT Ventricular Rate:  57 PR Interval:  154 QRS Duration:  110 QT Interval:  442 QTC Calculation: 430 R Axis:   44  Text Interpretation: Sinus bradycardia Incomplete right bundle branch block When compared with ECG of 01-Dec-2023 08:04, Sinus rhythm has replaced Junctional rhythm Incomplete right bundle branch block is now Present Confirmed by Lucien Blanc [phone]) on 04/02/2024 5:17:36 PM   PET/CT 03/24/24    Reversible perfusion defect in apical anterior/inferior/lateral walls and apex consistent with ischemia.  Myocardial blood flow reserve is overall normal but reduced in area of perfusion defect (though can be inaccurate in setting of prior CABG).  No high risk findings such as TID or drop in EF with stress.  Overall, study suggests LAD territory  ischemia and is intermediate risk   LV perfusion is abnormal. There is evidence of ischemia. Defect 1: There is a medium defect with moderate reduction in uptake present in the apical anterior, inferior and lateral location(s) that is reversible. There is normal wall motion in  the defect area. Consistent with ischemia.   Rest left ventricular function is abnormal. Rest EF: 44%. Stress left ventricular function is normal. Stress EF: 58%. End diastolic cavity size is normal. End systolic cavity size is normal.   Myocardial blood flow was computed to be 0.63ml/g/min at rest and 1.76ml/g/min at stress. Global myocardial blood flow reserve was 2.55 and was normal.   Coronary calcium  assessment not performed due to prior revascularization.   Findings are consistent with ischemia. The study is intermediate risk.   Electronically signed by Fae Nanas, MD   CLINICAL DATA:  This over-read does not include interpretation of cardiac or coronary anatomy or pathology. The interpretation by the cardiologist is attached.   COMPARISON:  None Available.   FINDINGS: Scout view is grossly unremarkable. Calcified mediastinal and hilar lymph nodes. Calcified granulomas. No acute extracardiac findings.   IMPRESSION: No acute extracardiac findings.     Electronically Signed   By: Newell Eke M.D.   On: 03/24/2024 09:49   Cardiac cath 04/2017  Left Anterior Descending  Vessel is large.        First Diagonal Branch  Vessel is small in size.    Second Diagonal Branch  Vessel is large in size.      Third Diagonal Branch  Vessel is small in size.      Left Circumflex      First Obtuse Marginal Branch      Right Coronary Artery  The lesion is eccentric and thrombotic.    Right Posterior Descending Artery      Right Posterior Atrioventricular Artery      Second Right Posterolateral Branch      Third Right Posterolateral Branch      Intervention   No interventions have been documented.   Wall Motion              Left Heart  Left Ventricle The left ventricular size is normal. The left ventricular systolic function is normal. LV end diastolic pressure is normal. The left ventricular ejection fraction is 55-65% by visual estimate.    Coronary Diagrams  Diagnostic Dominance: Right  Intervention      Physical Exam VS:  BP 112/64   Pulse (!) 57   Ht 5' 7.5 (1.715 m)   Wt 181 lb 9.6 oz (82.4 kg)   SpO2 96%   BMI 28.02 kg/m        Wt Readings from Last 3 Encounters:  04/02/24 181 lb 9.6 oz (82.4 kg)  12/01/23 180 lb 12.8 oz (82 kg)  11/20/22 180 lb 3.2 oz (81.7 kg)    GEN: Well nourished, well developed in no acute distress NECK: No JVD; No carotid bruits CARDIAC: RRR, no murmurs, rubs, gallops RESPIRATORY:  Clear to auscultation without rales, wheezing or rhonchi  ABDOMEN: Soft, non-tender, non-distended EXTREMITIES:  No edema; No deformity   ASSESSMENT AND PLAN  Coronary artery disease with prior CABG and reversible ischemia Reversible ischemia in anterior, inferior, and lateral apical regions suggests potential graft occlusion or blockages in non-bypassed branches. Previous CABG in 2018 addressed LAD, right coronary, and circumflex arteries. No chest pain or significant dyspnea, but decreased stamina and mild exertional dyspnea reported. - Schedule right and left heart  catheterization for next Friday. - Order CBC and BMP within 30 days prior to catheterization. - Ensure EKG is done within 30 days prior to catheterization. - Prescribe nitroglycerin  tablets as needed for symptoms. - Discuss potential need for stenting or balloon angioplasty based on catheterization findings.   Palpitations Intermittent palpitations reported, no extra heartbeats detected. Previous heart monitor not worn due to work constraints. No current symptoms warranting immediate intervention. - Consider heart monitor if palpitations worsen.  Bradycardia Heart rate in fifties, asymptomatic. Current metoprolol  dose is low and well-tolerated. - Continue current metoprolol  dosage.      The patient understands that risks include but are not limited to stroke (1 in 1000), death (1 in 1000), kidney failure [usually temporary] (1  in 500), bleeding (1 in 200), allergic reaction [possibly serious] (1 in 200), and agrees to proceed.      Dispo: He can follow-up after cardiac catheterization  Signed, Orren LOISE Fabry, PA-C

## 2024-04-05 DIAGNOSIS — I2510 Atherosclerotic heart disease of native coronary artery without angina pectoris: Secondary | ICD-10-CM | POA: Diagnosis not present

## 2024-04-05 DIAGNOSIS — R001 Bradycardia, unspecified: Secondary | ICD-10-CM | POA: Diagnosis not present

## 2024-04-05 DIAGNOSIS — E785 Hyperlipidemia, unspecified: Secondary | ICD-10-CM | POA: Diagnosis not present

## 2024-04-05 DIAGNOSIS — I214 Non-ST elevation (NSTEMI) myocardial infarction: Secondary | ICD-10-CM | POA: Diagnosis not present

## 2024-04-05 DIAGNOSIS — R002 Palpitations: Secondary | ICD-10-CM | POA: Diagnosis not present

## 2024-04-05 LAB — BASIC METABOLIC PANEL WITH GFR
BUN/Creatinine Ratio: 9 — ABNORMAL LOW (ref 10–24)
BUN: 9 mg/dL (ref 8–27)
CO2: 23 mmol/L (ref 20–29)
Calcium: 9.4 mg/dL (ref 8.6–10.2)
Chloride: 101 mmol/L (ref 96–106)
Creatinine, Ser: 1 mg/dL (ref 0.76–1.27)
Glucose: 104 mg/dL — ABNORMAL HIGH (ref 70–99)
Potassium: 4.6 mmol/L (ref 3.5–5.2)
Sodium: 139 mmol/L (ref 134–144)
eGFR: 79 mL/min/1.73 (ref >59)

## 2024-04-05 LAB — CBC
Hematocrit: 45 % (ref 37.5–51.0)
Hemoglobin: 14.8 g/dL (ref 13.0–17.7)
MCH: 31.4 pg (ref 26.6–33.0)
MCHC: 32.9 g/dL (ref 31.5–35.7)
MCV: 95 fL (ref 79–97)
Platelets: 305 x10E3/uL (ref 150–450)
RBC: 4.72 x10E6/uL (ref 4.14–5.80)
RDW: 12.7 % (ref 11.6–15.4)
WBC: 7.5 x10E3/uL (ref 3.4–10.8)

## 2024-04-08 ENCOUNTER — Other Ambulatory Visit: Payer: Self-pay

## 2024-04-08 ENCOUNTER — Ambulatory Visit: Payer: Self-pay | Admitting: Physician Assistant

## 2024-04-08 ENCOUNTER — Encounter (HOSPITAL_COMMUNITY)
Admission: RE | Disposition: A | Discharge status: Home / Self Care | Payer: Self-pay | Source: Home / Self Care | Attending: Cardiovascular Disease

## 2024-04-08 ENCOUNTER — Ambulatory Visit (HOSPITAL_COMMUNITY)
Admission: RE | Admit: 2024-04-08 | Discharge: 2024-04-08 | Disposition: A | Discharge status: Home / Self Care | Attending: Cardiovascular Disease | Admitting: Cardiovascular Disease

## 2024-04-08 DIAGNOSIS — E785 Hyperlipidemia, unspecified: Secondary | ICD-10-CM | POA: Insufficient documentation

## 2024-04-08 DIAGNOSIS — I252 Old myocardial infarction: Secondary | ICD-10-CM | POA: Diagnosis not present

## 2024-04-08 DIAGNOSIS — I2510 Atherosclerotic heart disease of native coronary artery without angina pectoris: Secondary | ICD-10-CM

## 2024-04-08 DIAGNOSIS — I10 Essential (primary) hypertension: Secondary | ICD-10-CM | POA: Insufficient documentation

## 2024-04-08 DIAGNOSIS — I25810 Atherosclerosis of coronary artery bypass graft(s) without angina pectoris: Secondary | ICD-10-CM | POA: Diagnosis not present

## 2024-04-08 DIAGNOSIS — R001 Bradycardia, unspecified: Secondary | ICD-10-CM | POA: Diagnosis not present

## 2024-04-08 DIAGNOSIS — Z8249 Family history of ischemic heart disease and other diseases of the circulatory system: Secondary | ICD-10-CM | POA: Insufficient documentation

## 2024-04-08 DIAGNOSIS — R9439 Abnormal result of other cardiovascular function study: Secondary | ICD-10-CM | POA: Diagnosis present

## 2024-04-08 DIAGNOSIS — Z79899 Other long term (current) drug therapy: Secondary | ICD-10-CM | POA: Insufficient documentation

## 2024-04-08 DIAGNOSIS — I2582 Chronic total occlusion of coronary artery: Secondary | ICD-10-CM | POA: Diagnosis not present

## 2024-04-08 LAB — POCT I-STAT EG7
Acid-Base Excess: 1 mmol/L (ref 0.0–2.0)
Bicarbonate: 26.2 mmol/L (ref 20.0–28.0)
Calcium, Ion: 1.24 mmol/L (ref 1.15–1.40)
HCT: 38 % — ABNORMAL LOW (ref 39.0–52.0)
Hemoglobin: 12.9 g/dL — ABNORMAL LOW (ref 13.0–17.0)
O2 Saturation: 74 %
Potassium: 3.8 mmol/L (ref 3.5–5.1)
Sodium: 140 mmol/L (ref 135–145)
TCO2: 27 mmol/L (ref 22–32)
pCO2, Ven: 42.3 mmHg — ABNORMAL LOW (ref 44–60)
pH, Ven: 7.399 (ref 7.25–7.43)
pO2, Ven: 39 mmHg (ref 32–45)

## 2024-04-08 LAB — POCT ACTIVATED CLOTTING TIME: Activated Clotting Time: 222 s

## 2024-04-08 SURGERY — RIGHT/LEFT HEART CATH AND CORONARY/GRAFT ANGIOGRAPHY
Anesthesia: LOCAL

## 2024-04-08 MED ORDER — MIDAZOLAM HCL 2 MG/2ML IJ SOLN
INTRAMUSCULAR | Status: AC
  Filled 2024-04-08: qty 2

## 2024-04-08 MED ORDER — SODIUM CHLORIDE 0.9% FLUSH
3.0000 mL | Freq: Two times a day (BID) | INTRAVENOUS | Status: DC
Start: 2024-04-08 — End: 2024-04-09

## 2024-04-08 MED ORDER — VERAPAMIL HCL 2.5 MG/ML IV SOLN
INTRAVENOUS | Status: AC
  Filled 2024-04-08: qty 2

## 2024-04-08 MED ORDER — SODIUM CHLORIDE 0.9% FLUSH: 3.0000 mL | INTRAVENOUS | Status: DC | PRN

## 2024-04-08 MED ORDER — ACETAMINOPHEN 325 MG PO TABS: 650.0000 mg | ORAL_TABLET | ORAL | Status: DC | PRN

## 2024-04-08 MED ORDER — MIDAZOLAM HCL 2 MG/2ML IJ SOLN
INTRAMUSCULAR | Status: DC | PRN
  Administered 2024-04-08: 2 mg via INTRAVENOUS

## 2024-04-08 MED ORDER — VERAPAMIL HCL 2.5 MG/ML IV SOLN
INTRAVENOUS | Status: DC | PRN
  Administered 2024-04-08: 10 mL via INTRA_ARTERIAL

## 2024-04-08 MED ORDER — SODIUM CHLORIDE 0.9 % IV SOLN: 250.0000 mL | INTRAVENOUS | Status: DC | PRN

## 2024-04-08 MED ORDER — FREE WATER
500.0000 mL | Freq: Once | Status: DC
Start: 2024-04-08 — End: 2024-04-09

## 2024-04-08 MED ORDER — LIDOCAINE HCL (PF) 1 % IJ SOLN
INTRAMUSCULAR | Status: DC | PRN
  Administered 2024-04-08 (×2): 2 mL

## 2024-04-08 MED ORDER — ASPIRIN 81 MG PO CHEW
CHEWABLE_TABLET | ORAL | Status: AC
  Filled 2024-04-08: qty 1

## 2024-04-08 MED ORDER — LABETALOL HCL 5 MG/ML IV SOLN: 10.0000 mg | INTRAVENOUS | Status: DC | PRN

## 2024-04-08 MED ORDER — HEPARIN SODIUM (PORCINE) 1000 UNIT/ML IJ SOLN
INTRAMUSCULAR | Status: AC
  Filled 2024-04-08: qty 10

## 2024-04-08 MED ORDER — IOHEXOL 350 MG/ML SOLN
INTRAVENOUS | Status: DC | PRN
  Administered 2024-04-08: 150 mL

## 2024-04-08 MED ORDER — HEPARIN (PORCINE) IN NACL 1000-0.9 UT/500ML-% IV SOLN
INTRAVENOUS | Status: DC | PRN
  Administered 2024-04-08: 1000 mL

## 2024-04-08 MED ORDER — HEPARIN SODIUM (PORCINE) 1000 UNIT/ML IJ SOLN
INTRAMUSCULAR | Status: DC | PRN
  Administered 2024-04-08: 3000 [IU] via INTRAVENOUS
  Administered 2024-04-08: 5000 [IU] via INTRAVENOUS

## 2024-04-08 MED ORDER — FENTANYL CITRATE (PF) 100 MCG/2ML IJ SOLN
INTRAMUSCULAR | Status: DC | PRN
  Administered 2024-04-08: 25 ug via INTRAVENOUS

## 2024-04-08 MED ORDER — ONDANSETRON HCL 4 MG/2ML IJ SOLN: 4.0000 mg | Freq: Four times a day (QID) | INTRAMUSCULAR | Status: DC | PRN

## 2024-04-08 MED ORDER — FENTANYL CITRATE (PF) 100 MCG/2ML IJ SOLN
INTRAMUSCULAR | Status: AC
  Filled 2024-04-08: qty 2

## 2024-04-08 MED ORDER — HYDRALAZINE HCL 20 MG/ML IJ SOLN: 10.0000 mg | INTRAMUSCULAR | Status: DC | PRN

## 2024-04-08 MED ORDER — ASPIRIN 81 MG PO CHEW
81.0000 mg | CHEWABLE_TABLET | Freq: Once | ORAL | Status: AC
  Administered 2024-04-08: 81 mg via ORAL

## 2024-04-08 SURGICAL SUPPLY — 15 items
CATH BALLN WEDGE 5F 110CM (CATHETERS) IMPLANT
CATH EXPO 5F MPA-1 (CATHETERS) IMPLANT
CATH INFINITI 5FR AL1 (CATHETERS) IMPLANT
CATH INFINITI 5FR JL4 (CATHETERS) IMPLANT
CATH INFINITI JR4 5F (CATHETERS) IMPLANT
CATH LAUNCHER 6FR JR4 (CATHETERS) IMPLANT
DEVICE RAD COMP TR BAND LRG (VASCULAR PRODUCTS) IMPLANT
ELECT DEFIB PAD ADLT CADENCE (PAD) IMPLANT
GLIDESHEATH SLEND SS 6F .021 (SHEATH) IMPLANT
GUIDEWIRE INQWIRE 1.5J.035X260 (WIRE) IMPLANT
GUIDEWIRE PRESSURE X 175 (WIRE) IMPLANT
KIT ESSENTIALS PG (KITS) IMPLANT
PACK CARDIAC CATHETERIZATION (CUSTOM PROCEDURE TRAY) ×1 IMPLANT
SET ATX-X65L (MISCELLANEOUS) IMPLANT
SHEATH GLIDE SLENDER 4/5FR (SHEATH) IMPLANT

## 2024-04-08 NOTE — Discharge Instructions (Signed)

## 2024-04-08 NOTE — Interval H&P Note (Signed)
 History and Physical Interval Note:  04/08/2024 3:29 PM  Jason Lewis  has presented today for surgery, with the diagnosis of doe.  The various methods of treatment have been discussed with the patient and family. After consideration of risks, benefits and other options for treatment, the patient has consented to  Procedure(s): RIGHT/LEFT HEART CATH AND CORONARY/GRAFT ANGIOGRAPHY (N/A) as a surgical intervention.  [name] history has been reviewed, patient examined, no change in status, stable for surgery.  I have reviewed the patient's chart and labs.  Questions were answered to the patient's satisfaction.     Ozell Fell

## 2024-04-08 NOTE — Progress Notes (Signed)
 Care took over  at 1855, discharge instructions were reviewed with PT, ambulated to the bathroom was able to void w/o difficulty. No s/s of complications at incision site. PT escorted off the unit via wheel hair to personal vehicle.

## 2024-04-09 ENCOUNTER — Encounter (HOSPITAL_COMMUNITY): Payer: Self-pay | Admitting: Cardiovascular Disease

## 2024-04-09 LAB — POCT I-STAT 7, (LYTES, BLD GAS, ICA,H+H)
Acid-Base Excess: 0 mmol/L (ref 0.0–2.0)
Bicarbonate: 23.9 mmol/L (ref 20.0–28.0)
Calcium, Ion: 1.21 mmol/L (ref 1.15–1.40)
HCT: 38 % — ABNORMAL LOW (ref 39.0–52.0)
Hemoglobin: 12.9 g/dL — ABNORMAL LOW (ref 13.0–17.0)
O2 Saturation: 94 %
Potassium: 3.8 mmol/L (ref 3.5–5.1)
Sodium: 140 mmol/L (ref 135–145)
TCO2: 25 mmol/L (ref 22–32)
pCO2 arterial: 37.5 mmHg (ref 32–48)
pH, Arterial: 7.412 (ref 7.35–7.45)
pO2, Arterial: 70 mmHg — ABNORMAL LOW (ref 83–108)

## 2024-04-28 ENCOUNTER — Ambulatory Visit (HOSPITAL_COMMUNITY)
Admission: RE | Admit: 2024-04-28 | Discharge: 2024-04-28 | Disposition: A | Discharge status: Home / Self Care | Source: Ambulatory Visit | Attending: Cardiovascular Disease | Admitting: Cardiovascular Disease

## 2024-04-28 DIAGNOSIS — R0602 Shortness of breath: Secondary | ICD-10-CM | POA: Insufficient documentation

## 2024-04-29 ENCOUNTER — Ambulatory Visit: Payer: Self-pay | Admitting: Cardiology

## 2024-04-29 ENCOUNTER — Encounter: Payer: Self-pay | Admitting: Cardiology

## 2024-04-29 DIAGNOSIS — I340 Nonrheumatic mitral (valve) insufficiency: Secondary | ICD-10-CM | POA: Insufficient documentation

## 2024-04-29 LAB — ECHOCARDIOGRAM COMPLETE
Area-P 1/2: 2.07 cm2
MV M vel: 5.29 m/s
MV Peak grad: 111.9 mmHg
P 1/2 time: 662 ms
Radius: 0.6 cm
S' Lateral: 3.1 cm

## 2024-05-03 NOTE — Progress Notes (Unsigned)
 Cardiology Office Note   Date:  05/04/2024  ID:  Jason Lewis, DOB 06/10/1951, MRN [id number] PCP: Clarice Nottingham, MD  [facility] Providers Cardiologist:  Wilbert Bihari, MD   History of Present Illness Jason Lewis is a 73 y.o. male with a past medical history of hypertension, HLD, CAD status post NSTEMI followed by CABG x 4 with LIMA to LAD, SVG to ramus, SVG to diagonal 2, SVG to PDA 05/19/2017.  Was last seen by Dr. Bihari May of this year.  Had been on high-dose atorvastatin  but developed myalgias and Lipitor  was decreased to 40 mg daily.  Had a nuclear stress test 09/2018 for preop clearance for knee surgery and this was normal.  At his last follow-up visit he denied any chest pain or pressure, PND, orthopnea, lower extremity edema, dizziness, palpitations or syncope.  Occasionally noted some very mild DOE but not really bothersome.  This is usually when doing really physical things.  Compliant with medications and tolerating without any side effects.  Ultimately at that visit a stress PET/CT was ordered to assess for ischemia since he was having some more DOE with harder work.  On 03/24/2024 a PET/CT showed reversible perfusion defect in the apical anterior/inferior/lateral walls and apex consistent with ischemia.  Myocardial blood flow reserve was overall normal but reduced in the area of the perfusion defect.  LV perfusion was abnormal with evidence of ischemia.  LVEF was noted to be 44%.  Ultimately, will need set up for cardiac catheterization.  I saw him 04/02/24, he presents with coronary artery disease for evaluation of decreased blood flow to the heart.   A PET CT scan on March 24, 2024, shows decreased blood flow to the anterior, inferior, and lateral apical regions of the heart, with a reversible perfusion defect. He underwent coronary artery bypass grafting in 2018 following a myocardial infarction.  He experiences no significant chest pain or shortness of breath at rest.  Mild dyspnea on exertion occurs during very physical activities but is not bothersome. He occasionally feels 'butterflies' in his chest, possibly palpitations, but denies any chest pain, pressure, or squeezing sensations.  He takes metoprolol  regularly and has not used nitroglycerin  tablets due to lack of symptoms. He previously took baby aspirin  but discontinued it after reading conflicting information. His family history includes heart disease, with his father and brother having had heart attacks in their forties and sixties.  Reports no shortness of breath nor dyspnea on exertion. Reports no chest pain, pressure, or tightness. No edema, orthopnea, PND.  Discussed the use of AI scribe software for clinical note transcription with the patient, who gave verbal consent to proceed.  Today, he presents with a history of coronary artery disease  for follow-up after a cardiac catheterization and echocardiogram.  He underwent cardiac catheterization last month, revealing complete occlusion of previous bypass grafts to the right coronary artery, circumflex artery, and his branches. The mammary artery graft to the left anterior descending artery remains patent. Other branches show 60-75% stenosis. No stents were placed due to the small size of the affected areas, and medical therapy was recommended.  He has elevated LDL cholesterol, with a recent level of 120 mg/dL, above the target of 55 mg/dL. He discontinued atorvastatin  in 2018 due to joint pain and is not on any cholesterol-lowering medication.  He experiences occasional palpitations, which are manageable. Recent blood pressure readings have been in the 140s, although it was 112/64 a month ago. He does not monitor blood  pressure at home. No significant chest pain or shortness of breath, but there is a slight decrease in exercise tolerance over the years.  A recent echocardiogram showed normal heart pump function but a mildly leaky mitral valve,  progressing from trivial in 2018 to mild.  Reports no shortness of breath nor dyspnea on exertion. Reports no chest pain, pressure, or tightness. No edema, orthopnea, PND. Reports no palpitations.   Discussed the use of AI scribe software for clinical note transcription with the patient, who gave verbal consent to proceed.   ROS: pertinent ROS in HPI  Studies Reviewed   Cardiac catheterization 04/08/2024  Left Anterior Descending  Vessel is large.  Prox LAD to Mid LAD lesion is 50% stenosed.  Mid LAD lesion is 80% stenosed.    First Diagonal Branch  Vessel is small in size.    Second Diagonal Branch  Vessel is large in size.  Ost 2nd Diag to 2nd Diag lesion is 70% stenosed.  2nd Diag lesion is 70% stenosed.    Third Diagonal Branch  Vessel is small in size.  3rd Diag lesion is 80% stenosed.    Left Circumflex  Mid Cx lesion is 75% stenosed.    First Obtuse Marginal Branch  Ost 1st Mrg to 1st Mrg lesion is 90% stenosed.    Right Coronary Artery  Mid RCA lesion is 60% stenosed. The lesion is eccentric and thrombotic.    Right Posterior Descending Artery  RPDA lesion is 100% stenosed.    Right Posterior Atrioventricular Artery  RPAV lesion is 60% stenosed.    Second Right Posterolateral Branch  2nd RPL lesion is 60% stenosed.    Third Right Posterolateral Branch  3rd RPL lesion is 70% stenosed.    Graft To RPDA  Origin lesion is 100% stenosed.    Graft To 2nd Mrg  Origin to Prox Graft lesion is 100% stenosed.    Graft To Ramus  Origin to Prox Graft lesion is 100% stenosed.    LIMA LIMA Graft To Mid LAD  LIMA. LIMA--->LAD widely patent    Intervention   No interventions have been documented.   Left Heart  Left Ventricle LV end diastolic pressure is normal.   Coronary Diagrams  Diagnostic Dominance: Right  Intervention      PET/CT 03/24/24    Reversible perfusion defect in apical anterior/inferior/lateral walls and apex consistent with  ischemia.  Myocardial blood flow reserve is overall normal but reduced in area of perfusion defect (though can be inaccurate in setting of prior CABG).  No high risk findings such as TID or drop in EF with stress.  Overall, study suggests LAD territory ischemia and is intermediate risk   LV perfusion is abnormal. There is evidence of ischemia. Defect 1: There is a medium defect with moderate reduction in uptake present in the apical anterior, inferior and lateral location(s) that is reversible. There is normal wall motion in the defect area. Consistent with ischemia.   Rest left ventricular function is abnormal. Rest EF: 44%. Stress left ventricular function is normal. Stress EF: 58%. End diastolic cavity size is normal. End systolic cavity size is normal.   Myocardial blood flow was computed to be 0.65ml/g/min at rest and 1.76ml/g/min at stress. Global myocardial blood flow reserve was 2.55 and was normal.   Coronary calcium  assessment not performed due to prior revascularization.   Findings are consistent with ischemia. The study is intermediate risk.   Electronically signed by Fae Nanas, MD   CLINICAL DATA:  This over-read does not include interpretation of cardiac or coronary anatomy or pathology. The interpretation by the cardiologist is attached.   COMPARISON:  None Available.   FINDINGS: Scout view is grossly unremarkable. Calcified mediastinal and hilar lymph nodes. Calcified granulomas. No acute extracardiac findings.   IMPRESSION: No acute extracardiac findings.     Electronically Signed   By: Newell Eke M.D.   On: 03/24/2024 09:49   Cardiac cath 04/2017  Left Anterior Descending  Vessel is large.        First Diagonal Branch  Vessel is small in size.    Second Diagonal Branch  Vessel is large in size.      Third Diagonal Branch  Vessel is small in size.      Left Circumflex      First Obtuse Marginal Branch      Right Coronary Artery  The  lesion is eccentric and thrombotic.    Right Posterior Descending Artery      Right Posterior Atrioventricular Artery      Second Right Posterolateral Branch      Third Right Posterolateral Branch      Intervention   No interventions have been documented.   Wall Motion              Left Heart  Left Ventricle The left ventricular size is normal. The left ventricular systolic function is normal. LV end diastolic pressure is normal. The left ventricular ejection fraction is 55-65% by visual estimate.   Coronary Diagrams  Diagnostic Dominance: Right  Intervention      Physical Exam VS:  BP 138/70   Pulse (!) 57   Ht 5' 8 (1.727 m)   Wt 185 lb 12.8 oz (84.3 kg)   SpO2 96%   BMI 28.25 kg/m        Wt Readings from Last 3 Encounters:  05/04/24 185 lb 12.8 oz (84.3 kg)  04/08/24 180 lb (81.6 kg)  04/02/24 181 lb 9.6 oz (82.4 kg)    GEN: Well nourished, well developed in no acute distress NECK: No JVD; No carotid bruits CARDIAC: RRR, no murmurs, rubs, gallops RESPIRATORY:  Clear to auscultation without rales, wheezing or rhonchi  ABDOMEN: Soft, non-tender, non-distended EXTREMITIES:  No edema; No deformity   ASSESSMENT AND PLAN  Atherosclerotic heart disease with prior coronary artery bypass grafts and 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 Unusual occlusion within seven years, likely due to poor vein quality or plaque buildup. Managed medically due to small lesion size. - Continue medical therapy for coronary artery disease. -Referral for Repatha therapy, metoprolol  succinate, and as needed nitroglycerin  - Consider long-acting nitroglycerin  if symptoms worsen. - Cardiac rehab discussed but patient defers for now due to work schedule.  Has a treadmill and bike at home and stated he could do this on his own time.  Hyperlipidemia LDL cholesterol elevated  at 120 mg/dL, target <44 mg/dL due to coronary artery disease. Atorvastatin  discontinued due to side effects. Repatha (evolocumab) recommended for superior LDL reduction. - Prescribe Repatha (evolocumab) as a twice-monthly injection. - Initiate prior authorization for Repatha. - Repeat lipid panel in three months. - Consider adding low-dose statin if LDL goals unmet with Repatha.  Mitral valve regurgitation, mild to moderate Mild to moderate regurgitation on echocardiogram, progressed from trivial in 2018. Asymptomatic with no heart failure signs. Treatment options include medication or procedures if progression occurs. - Monitor  with annual echocardiograms. - Watch for symptoms such as swelling in feet or ankles and shortness of breath.  Palpitations Intermittent palpitations, manageable and not distressing. - Report any increase in frequency or severity.  Bradycardia Heart rate in the 50s, asymptomatic.  Shortness of breath Mild, likely age-related, no significant recent changes. - Report any dramatic changes in symptoms.    Dispo: He can follow-up in 6 months to see Dr. Shlomo, sooner if needed.  Signed, Orren LOISE Fabry, PA-C

## 2024-05-04 ENCOUNTER — Telehealth: Payer: Self-pay

## 2024-05-04 ENCOUNTER — Encounter: Payer: Self-pay | Admitting: Physician Assistant

## 2024-05-04 ENCOUNTER — Other Ambulatory Visit (HOSPITAL_COMMUNITY): Payer: Self-pay

## 2024-05-04 ENCOUNTER — Ambulatory Visit: Attending: Physician Assistant | Admitting: Physician Assistant

## 2024-05-04 VITALS — BP 138/70 | HR 57 | Ht 68.0 in | Wt 185.8 lb

## 2024-05-04 DIAGNOSIS — E785 Hyperlipidemia, unspecified: Secondary | ICD-10-CM

## 2024-05-04 DIAGNOSIS — I2510 Atherosclerotic heart disease of native coronary artery without angina pectoris: Secondary | ICD-10-CM

## 2024-05-04 DIAGNOSIS — I214 Non-ST elevation (NSTEMI) myocardial infarction: Secondary | ICD-10-CM | POA: Diagnosis not present

## 2024-05-04 DIAGNOSIS — R001 Bradycardia, unspecified: Secondary | ICD-10-CM

## 2024-05-04 DIAGNOSIS — R002 Palpitations: Secondary | ICD-10-CM | POA: Diagnosis not present

## 2024-05-04 DIAGNOSIS — I340 Nonrheumatic mitral (valve) insufficiency: Secondary | ICD-10-CM | POA: Diagnosis not present

## 2024-05-04 DIAGNOSIS — R0602 Shortness of breath: Secondary | ICD-10-CM | POA: Diagnosis not present

## 2024-05-04 NOTE — Patient Instructions (Signed)
 Medication Instructions:  Your physician recommends that you continue on your current medications as directed. Please refer to the Current Medication list given to you today.  *If you need a refill on your cardiac medications before your next appointment, please call your pharmacy*  Lab Work: FASTING LIPIDS AND LFT'S-IN If you have labs (blood work) drawn today and your tests are completely normal, you will receive your results only by: MyChart Message (if you have MyChart) OR A paper copy in the mail If you have any lab test that is abnormal or we need to change your treatment, we will call you to review the results.  Testing/Procedures: Your physician has requested that you have an echocardiogram in 1 year. Echocardiography is a painless test that uses sound waves to create images of your heart. It provides your doctor with information about the size and shape of your heart and how well your heart's chambers and valves are working. This procedure takes approximately one hour. There are no restrictions for this procedure. Please do NOT wear cologne, perfume, aftershave, or lotions (deodorant is allowed). Please arrive 15 minutes prior to your appointment time.  Please note: We ask at that you not bring children with you during ultrasound (echo/ vascular) testing. Due to room size and safety concerns, children are not allowed in the ultrasound rooms during exams. Our front office staff cannot provide observation of children in our lobby area while testing is being conducted. An adult accompanying a patient to their appointment will only be allowed in the ultrasound room at the discretion of the ultrasound technician under special circumstances. We apologize for any inconvenience.   Follow-Up: At [facility], you and your health needs are our priority.  As part of our continuing mission to provide you with exceptional heart care, our providers are all part of one team.  This team  includes your primary Cardiologist (physician) and Advanced Practice Providers or APPs (Physician Assistants and Nurse Practitioners) who all work together to provide you with the care you need, when you need it.  Your next appointment:   6 month(s)  Provider:   Wilbert Bihari, MD   Check your blood pressure daily, 1 hr after morning medications for 1-2 weeks, keep a log and send us  the readings through mychart at the end of the 2 weeks.

## 2024-05-04 NOTE — Telephone Encounter (Signed)
-----   Message from Orren LOISE Fabry sent at 05/04/2024  9:53 AM EDT ----- Hello, I have asked this patient to start on Repatha for LDL reduction.  I was hoping I could get your help with the prior authorization.  Thank you!

## 2024-05-04 NOTE — Telephone Encounter (Signed)
 Pharmacy Patient Advocate Encounter   Received notification from Physician's Office that prior authorization for REPATHA is required/requested.   Insurance verification completed.   The patient is insured through [facility] [employer].   Per test claim: PA required; PA started via CoverMyMeds. KEY [id number] . Please see clinical question(s) below that I am not finding the answer to in their chart and advise.

## 2024-05-05 NOTE — Telephone Encounter (Signed)
 Plan requires LDL to have been checked within the last 120 days from request submission so we would need a LDL lab resulting on or after 01/05/2024.

## 2024-05-06 ENCOUNTER — Ambulatory Visit: Admitting: Physician Assistant

## 2024-05-06 ENCOUNTER — Other Ambulatory Visit: Payer: Self-pay

## 2024-05-06 DIAGNOSIS — I340 Nonrheumatic mitral (valve) insufficiency: Secondary | ICD-10-CM

## 2024-05-06 NOTE — Telephone Encounter (Signed)
 Spoke with pt's wife and explained that we need to have an LDL level drawn for the Repatha PA to be completed. Pt's wife was not happy about pt needing to have more labs completed and was irritated because she just got a call about the pt needing an echo scheduled when he just had one. Educated pt's wife that the most recent echo order is for 2026 and they were just calling to get this scheduled for then. Pt's wife stated she understood that and had to get us  straightened out. Pt's wife proceeded to ask if there was anything else the pt was going to need to have done since our office keeps calling. Explained that I am not the pt's providers nurse and that for the time being, this is the only order needing to be completed. Advised pt's wife to have pt complete lab work at next convenience for us  to complete the PA. Pt's wife verbalized understanding and had no further questions at this time.

## 2024-05-10 NOTE — Telephone Encounter (Signed)
-----   Message from Wilbert Bihari sent at 04/29/2024  9:03 PM EDT ----- Echo showed normal heart function with moderately leaky MV - repeat echo in 1 year for MR ----- Message ----- From: Interface, Three One Seven Sent: 04/29/2024  12:59 PM EDT To: Wilbert JONELLE Bihari, MD

## 2024-05-10 NOTE — Telephone Encounter (Signed)
 Attempted to call patient to discuss echo results. Spouse answering phone but no updated DPR on file. Spouse states patient is in [place]  and will call back when he returns.

## 2024-05-13 DIAGNOSIS — L509 Urticaria, unspecified: Secondary | ICD-10-CM | POA: Diagnosis not present

## 2024-05-20 DIAGNOSIS — E785 Hyperlipidemia, unspecified: Secondary | ICD-10-CM | POA: Diagnosis not present

## 2024-05-21 LAB — LIPID PANEL
Chol/HDL Ratio: 4 ratio (ref 0.0–5.0)
Cholesterol, Total: 178 mg/dL (ref 100–199)
HDL: 45 mg/dL (ref >39)
LDL Chol Calc (NIH): 109 mg/dL — ABNORMAL HIGH (ref 0–99)
Triglycerides: 137 mg/dL (ref 0–149)
VLDL Cholesterol Cal: 24 mg/dL (ref 5–40)

## 2024-05-26 ENCOUNTER — Ambulatory Visit: Payer: Self-pay | Admitting: Physician Assistant

## 2024-05-26 NOTE — Progress Notes (Signed)
 Left voicemail for pt to cb for lab results. Updated pt cell # as well [phone]

## 2024-05-27 NOTE — Telephone Encounter (Signed)
 LVM asking pt to call our office to discuss lab results. 2nd attempt

## 2024-06-01 DIAGNOSIS — L258 Unspecified contact dermatitis due to other agents: Secondary | ICD-10-CM | POA: Diagnosis not present

## 2024-06-07 DIAGNOSIS — B86 Scabies: Secondary | ICD-10-CM | POA: Diagnosis not present

## 2024-06-07 NOTE — Progress Notes (Unsigned)
 Patient ID: TRAQUAN DUARTE                 DOB: 1950-08-15                    MRN: [id number]      HPI: Jason Lewis is a 73 y.o. male patient referred to lipid clinic by  ***. PMH is significant for hypertension, HLD, CAD status post NSTEMI followed by CABG x 4.   Reviewed options for lowering LDL cholesterol, including ezetimibe, PCSK-9 inhibitors, bempedoic acid and inclisiran.  Discussed mechanisms of action, dosing, side effects and potential decreases in LDL cholesterol.  Also reviewed cost information and potential options for patient assistance.  Current Medications:  Intolerances:  Risk Factors:  LDL-C goal:  ApoB goal:   Diet:   Exercise:   Family History:   Social History:  Never Smoked  Labs: Lipid Panel     Component Value Date/Time   CHOL 178 05/20/2024 0819   TRIG 137 05/20/2024 0819   HDL 45 05/20/2024 0819   CHOLHDL 4.0 05/20/2024 0819   CHOLHDL 4.8 05/12/2017 1832   VLDL 25 05/12/2017 1832   LDLCALC 109 (H) 05/20/2024 0819   LABVLDL 24 05/20/2024 0819    Past Medical History:  Diagnosis Date   Arthritis    CAD in native artery    a.  CABG x 4 utilizing LIMA ot LAD, SVG to Ramus, SVG to Diagonal #2, and SVG to PDA on 05/19/17.   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 A1C 5.7 in 04/2017.   Hyperlipidemia    Medical history non-contributory    Mitral regurgitation    moderate by ech0 04/2024   Non-ST elevation (NSTEMI) myocardial infarction [facility])     Current Outpatient Medications on File Prior to Visit  Medication Sig Dispense Refill   levothyroxine (SYNTHROID) 50 MCG tablet Take 50 mcg by mouth every morning.     metoprolol  succinate (TOPROL -XL) 25 MG 24 hr tablet Take 1 tablet by mouth once daily 90 tablet 3   nitroGLYCERIN  (NITROSTAT ) 0.4 MG SL tablet Place 1 tablet (0.4 mg total) under the tongue every 5 (five) minutes as needed for chest pain. 90 tablet 3   sertraline (ZOLOFT) 50 MG tablet Take 50 mg by mouth  daily.     No current facility-administered medications on file prior to visit.    No Known Allergies  Assessment/Plan:  1. Hyperlipidemia -  No problem-specific Assessment & Plan notes found for this encounter.    Thank you,  Melissa D Maccia, Pharm.JONETTA SARAN, CPP [facility] A Division of [facility] [address], [place], [place] [address]  Phone: [phone]; Fax: [phone]

## 2024-06-08 ENCOUNTER — Ambulatory Visit: Attending: Cardiology | Admitting: Pharmacist

## 2024-06-08 DIAGNOSIS — E785 Hyperlipidemia, unspecified: Secondary | ICD-10-CM | POA: Diagnosis not present

## 2024-06-08 MED ORDER — ROSUVASTATIN CALCIUM 20 MG PO TABS: 20.0000 mg | ORAL_TABLET | Freq: Every day | ORAL | 3 refills | Status: AC

## 2024-06-08 NOTE — Patient Instructions (Signed)
 Pease start taking rosuvastatin 20mg  daily Please go for Fasting lab work in Jan Please call me at [phone] with any issues  [facility] locations: [facility] - [address] First floor - [address] ([facility])  - [address] [phone]    [facility] [employer] [facility] office - [address] [facility] - [address]   [place]  -[address] Third floor ([facility]) [phone]    [place] - [address]  - [address] [place]  - [address] [place] - [address] ([facility])

## 2024-06-08 NOTE — Assessment & Plan Note (Signed)
 Assessment:  Uncontrolled: Current LDL 109 Patient had stopped Atorvastatin  due to reported myalgias. He stated that he had some muscle pain, but was not sure if it was due to his physically demanding job. No other side effects reported. Patient was agreeable to trying another statin.   Plan: Start Rosuvastatin 20 mg daily.   Follow-up:  Labs in 2-3 months.

## 2024-06-15 DIAGNOSIS — L308 Other specified dermatitis: Secondary | ICD-10-CM | POA: Diagnosis not present

## 2024-06-15 DIAGNOSIS — B86 Scabies: Secondary | ICD-10-CM | POA: Diagnosis not present

## 2024-08-31 ENCOUNTER — Encounter: Payer: Self-pay | Admitting: Pharmacist

## 2024-09-01 ENCOUNTER — Emergency Department (HOSPITAL_COMMUNITY)

## 2024-09-01 ENCOUNTER — Encounter (HOSPITAL_COMMUNITY): Payer: Self-pay

## 2024-09-01 ENCOUNTER — Inpatient Hospital Stay (HOSPITAL_COMMUNITY)

## 2024-09-01 ENCOUNTER — Inpatient Hospital Stay (HOSPITAL_COMMUNITY): Admission: EM | Admit: 2024-09-01 | Source: Home / Self Care | Admitting: Neurosurgery

## 2024-09-01 ENCOUNTER — Other Ambulatory Visit: Payer: Self-pay

## 2024-09-01 DIAGNOSIS — S06360A Traumatic hemorrhage of cerebrum, unspecified, without loss of consciousness, initial encounter: Principal | ICD-10-CM

## 2024-09-01 DIAGNOSIS — T07XXXA Unspecified multiple injuries, initial encounter: Secondary | ICD-10-CM

## 2024-09-01 DIAGNOSIS — I609 Nontraumatic subarachnoid hemorrhage, unspecified: Secondary | ICD-10-CM

## 2024-09-01 DIAGNOSIS — S0291XA Unspecified fracture of skull, initial encounter for closed fracture: Secondary | ICD-10-CM

## 2024-09-01 LAB — COMPREHENSIVE METABOLIC PANEL WITH GFR
ALT: 29 U/L (ref 0–44)
AST: 32 U/L (ref 15–41)
Albumin: 4.1 g/dL (ref 3.5–5.0)
Alkaline Phosphatase: 69 U/L (ref 38–126)
Anion gap: 15 (ref 5–15)
BUN: 17 mg/dL (ref 8–23)
CO2: 22 mmol/L (ref 22–32)
Calcium: 8.8 mg/dL — ABNORMAL LOW (ref 8.9–10.3)
Chloride: 101 mmol/L (ref 98–111)
Creatinine, Ser: 1.06 mg/dL (ref 0.61–1.24)
GFR, Estimated: >60 mL/min
Glucose, Bld: 183 mg/dL — ABNORMAL HIGH (ref 70–99)
Potassium: 3.2 mmol/L — ABNORMAL LOW (ref 3.5–5.1)
Sodium: 138 mmol/L (ref 135–145)
Total Bilirubin: 0.5 mg/dL (ref 0.0–1.2)
Total Protein: 6.2 g/dL — ABNORMAL LOW (ref 6.5–8.1)

## 2024-09-01 LAB — I-STAT CHEM 8, ED
BUN: 18 mg/dL (ref 8–23)
Calcium, Ion: 1.11 mmol/L — ABNORMAL LOW (ref 1.15–1.40)
Chloride: 101 mmol/L (ref 98–111)
Creatinine, Ser: 1.2 mg/dL (ref 0.61–1.24)
Glucose, Bld: 182 mg/dL — ABNORMAL HIGH (ref 70–99)
HCT: 40 % (ref 39.0–52.0)
Hemoglobin: 13.6 g/dL (ref 13.0–17.0)
Potassium: 3.1 mmol/L — ABNORMAL LOW (ref 3.5–5.1)
Sodium: 139 mmol/L (ref 135–145)
TCO2: 21 mmol/L — ABNORMAL LOW (ref 22–32)

## 2024-09-01 LAB — CBC
HCT: 40.9 % (ref 39.0–52.0)
Hemoglobin: 14.1 g/dL (ref 13.0–17.0)
MCH: 31.6 pg (ref 26.0–34.0)
MCHC: 34.5 g/dL (ref 30.0–36.0)
MCV: 91.7 fL (ref 80.0–100.0)
Platelets: 254 10*3/uL (ref 150–400)
RBC: 4.46 MIL/uL (ref 4.22–5.81)
RDW: 11.9 % (ref 11.5–15.5)
WBC: 19.2 10*3/uL — ABNORMAL HIGH (ref 4.0–10.5)
nRBC: 0 % (ref 0.0–0.2)

## 2024-09-01 LAB — PROTIME-INR
INR: 1 (ref 0.8–1.2)
Prothrombin Time: 13.6 s (ref 11.4–15.2)

## 2024-09-01 LAB — ETHANOL: Alcohol, Ethyl (B): <15 mg/dL

## 2024-09-01 LAB — I-STAT CG4 LACTIC ACID, ED: Lactic Acid, Venous: 5.2 mmol/L (ref 0.5–1.9)

## 2024-09-01 MED ORDER — TETANUS-DIPHTH-ACELL PERTUSSIS 5-2-15.5 LF-MCG/0.5 IM SUSP
0.5000 mL | Freq: Once | INTRAMUSCULAR | Status: AC
  Administered 2024-09-01: 0.5 mL via INTRAMUSCULAR
  Filled 2024-09-01: qty 0.5

## 2024-09-01 MED ORDER — SENNOSIDES-DOCUSATE SODIUM 8.6-50 MG PO TABS
1.0000 | ORAL_TABLET | Freq: Two times a day (BID) | ORAL | Status: AC
  Administered 2024-09-01 – 2024-09-03 (×5): 1 via ORAL
  Filled 2024-09-01 (×5): qty 1

## 2024-09-01 MED ORDER — METRONIDAZOLE 500 MG/100ML IV SOLN
500.0000 mg | Freq: Two times a day (BID) | INTRAVENOUS | Status: AC
  Administered 2024-09-01 – 2024-09-02 (×2): 500 mg via INTRAVENOUS
  Filled 2024-09-01 (×2): qty 100

## 2024-09-01 MED ORDER — ACETAMINOPHEN 325 MG PO TABS
650.0000 mg | ORAL_TABLET | ORAL | Status: AC | PRN
  Administered 2024-09-01 – 2024-09-03 (×3): 650 mg via ORAL
  Filled 2024-09-01 (×4): qty 2

## 2024-09-01 MED ORDER — FENTANYL CITRATE (PF) 50 MCG/ML IJ SOSY
50.0000 ug | PREFILLED_SYRINGE | Freq: Once | INTRAMUSCULAR | Status: AC
  Administered 2024-09-01: 50 ug via INTRAVENOUS
  Filled 2024-09-01: qty 1

## 2024-09-01 MED ORDER — ONDANSETRON HCL 4 MG/2ML IJ SOLN
4.0000 mg | Freq: Once | INTRAMUSCULAR | Status: AC
  Administered 2024-09-01: 4 mg via INTRAVENOUS
  Filled 2024-09-01: qty 2

## 2024-09-01 MED ORDER — PROMETHAZINE (PHENERGAN) 6.25MG IN NS 50ML IVPB
6.2500 mg | Freq: Four times a day (QID) | INTRAVENOUS | Status: DC | PRN
  Filled 2024-09-01: qty 100

## 2024-09-01 MED ORDER — SODIUM CHLORIDE 0.9 % IV SOLN
2.0000 g | INTRAVENOUS | Status: AC
  Administered 2024-09-01 – 2024-09-02 (×2): 2 g via INTRAVENOUS
  Filled 2024-09-01 (×2): qty 20

## 2024-09-01 MED ORDER — HYDROMORPHONE HCL 1 MG/ML IJ SOLN
0.5000 mg | INTRAMUSCULAR | Status: AC | PRN
  Administered 2024-09-01 – 2024-09-02 (×3): 0.5 mg via INTRAVENOUS
  Filled 2024-09-01 (×3): qty 1

## 2024-09-01 MED ORDER — VANCOMYCIN HCL 1500 MG/300ML IV SOLN
1500.0000 mg | INTRAVENOUS | Status: DC
  Administered 2024-09-02: 1500 mg via INTRAVENOUS
  Filled 2024-09-01: qty 300

## 2024-09-01 MED ORDER — LIDOCAINE-EPINEPHRINE (PF) 2 %-1:200000 IJ SOLN
10.0000 mL | Freq: Once | INTRAMUSCULAR | Status: AC
  Administered 2024-09-01: 10 mL via INTRADERMAL
  Filled 2024-09-01: qty 20

## 2024-09-01 MED ORDER — CLEVIDIPINE BUTYRATE 0.5 MG/ML IV EMUL: 0.0000 mg/h | INTRAVENOUS | Status: DC

## 2024-09-01 MED ORDER — PANTOPRAZOLE SODIUM 40 MG IV SOLR
40.0000 mg | Freq: Every day | INTRAVENOUS | Status: DC
  Administered 2024-09-01 – 2024-09-02 (×2): 40 mg via INTRAVENOUS
  Filled 2024-09-01 (×2): qty 10

## 2024-09-01 MED ORDER — ACETAMINOPHEN 160 MG/5ML PO SOLN: 650.0000 mg | ORAL | Status: AC | PRN

## 2024-09-01 MED ORDER — LABETALOL HCL 5 MG/ML IV SOLN
20.0000 mg | Freq: Once | INTRAVENOUS | Status: DC
  Filled 2024-09-01: qty 4

## 2024-09-01 MED ORDER — IOHEXOL 350 MG/ML SOLN
75.0000 mL | Freq: Once | INTRAVENOUS | Status: AC | PRN
  Administered 2024-09-01: 75 mL via INTRAVENOUS

## 2024-09-01 MED ORDER — VANCOMYCIN HCL 1750 MG/350ML IV SOLN
1750.0000 mg | Freq: Once | INTRAVENOUS | Status: AC
  Administered 2024-09-01: 1750 mg via INTRAVENOUS
  Filled 2024-09-01: qty 350

## 2024-09-01 MED ORDER — CHLORHEXIDINE GLUCONATE CLOTH 2 % EX PADS
6.0000 | MEDICATED_PAD | Freq: Every day | CUTANEOUS | Status: AC
  Administered 2024-09-02 – 2024-09-03 (×3): 6 via TOPICAL

## 2024-09-01 MED ORDER — LEVETIRACETAM (KEPPRA) 500 MG/5 ML ADULT IV PUSH
500.0000 mg | Freq: Two times a day (BID) | INTRAVENOUS | Status: DC
  Administered 2024-09-01: 500 mg via INTRAVENOUS
  Filled 2024-09-01: qty 5

## 2024-09-01 MED ORDER — PROMETHAZINE (PHENERGAN) 6.25MG IN NS 50ML IVPB
6.2500 mg | Freq: Four times a day (QID) | INTRAVENOUS | Status: AC | PRN
  Administered 2024-09-02: 6.25 mg via INTRAVENOUS
  Filled 2024-09-01: qty 50

## 2024-09-01 MED ORDER — ACETAMINOPHEN 650 MG RE SUPP: 650.0000 mg | RECTAL | Status: AC | PRN

## 2024-09-01 MED ORDER — ONDANSETRON HCL 4 MG/2ML IJ SOLN
4.0000 mg | Freq: Once | INTRAMUSCULAR | Status: AC
  Administered 2024-09-01: 4 mg via INTRAVENOUS

## 2024-09-01 MED ORDER — ACETAMINOPHEN 500 MG PO TABS
1000.0000 mg | ORAL_TABLET | Freq: Once | ORAL | Status: AC
  Administered 2024-09-01: 1000 mg via ORAL
  Filled 2024-09-01: qty 2

## 2024-09-01 NOTE — ED Notes (Signed)
 Called and placed PT on monitor with CCMD

## 2024-09-01 NOTE — ED Notes (Signed)
 Report called in to 4N, Floor is in the middle of the shift change, will call back in 15 min
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
## 2024-09-01 NOTE — ED Notes (Addendum)
Lido and suture cart bedside.
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
## 2024-09-01 NOTE — ED Notes (Signed)
Patient transported to CT with primary RN. 
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
## 2024-09-02 DIAGNOSIS — T07XXXA Unspecified multiple injuries, initial encounter: Secondary | ICD-10-CM

## 2024-09-02 DIAGNOSIS — S0291XA Unspecified fracture of skull, initial encounter for closed fracture: Secondary | ICD-10-CM | POA: Diagnosis not present

## 2024-09-02 DIAGNOSIS — G935 Compression of brain: Secondary | ICD-10-CM

## 2024-09-02 DIAGNOSIS — S06360A Traumatic hemorrhage of cerebrum, unspecified, without loss of consciousness, initial encounter: Secondary | ICD-10-CM | POA: Diagnosis not present

## 2024-09-02 LAB — MRSA NEXT GEN BY PCR, NASAL: MRSA by PCR Next Gen: NOT DETECTED

## 2024-09-02 LAB — BASIC METABOLIC PANEL WITH GFR
Anion gap: 10 (ref 5–15)
BUN: 18 mg/dL (ref 8–23)
CO2: 25 mmol/L (ref 22–32)
Calcium: 8.6 mg/dL — ABNORMAL LOW (ref 8.9–10.3)
Chloride: 102 mmol/L (ref 98–111)
Creatinine, Ser: 0.93 mg/dL (ref 0.61–1.24)
GFR, Estimated: >60 mL/min
Glucose, Bld: 128 mg/dL — ABNORMAL HIGH (ref 70–99)
Potassium: 3.9 mmol/L (ref 3.5–5.1)
Sodium: 137 mmol/L (ref 135–145)

## 2024-09-02 LAB — TYPE AND SCREEN
ABO/RH(D): O POS
Antibody Screen: NEGATIVE

## 2024-09-02 LAB — MAGNESIUM: Magnesium: 2 mg/dL (ref 1.7–2.4)

## 2024-09-02 LAB — PHOSPHORUS: Phosphorus: 3.5 mg/dL (ref 2.5–4.6)

## 2024-09-02 MED ORDER — MUPIROCIN 2 % EX OINT
1.0000 | TOPICAL_OINTMENT | Freq: Two times a day (BID) | CUTANEOUS | Status: AC
  Administered 2024-09-02 – 2024-09-03 (×4): 1 via TOPICAL
  Filled 2024-09-02: qty 22

## 2024-09-02 MED ORDER — ROSUVASTATIN CALCIUM 20 MG PO TABS
20.0000 mg | ORAL_TABLET | Freq: Every day | ORAL | Status: AC
  Administered 2024-09-02 – 2024-09-03 (×2): 20 mg via ORAL
  Filled 2024-09-02 (×2): qty 1

## 2024-09-02 MED ORDER — SERTRALINE HCL 50 MG PO TABS
50.0000 mg | ORAL_TABLET | Freq: Every day | ORAL | Status: AC
  Administered 2024-09-02 – 2024-09-03 (×2): 50 mg via ORAL
  Filled 2024-09-02 (×2): qty 1

## 2024-09-02 MED ORDER — OXYCODONE HCL 5 MG PO TABS
5.0000 mg | ORAL_TABLET | ORAL | Status: AC | PRN
  Administered 2024-09-02 – 2024-09-03 (×6): 5 mg via ORAL
  Filled 2024-09-02 (×7): qty 1

## 2024-09-02 MED ORDER — POTASSIUM CHLORIDE CRYS ER 20 MEQ PO TBCR
40.0000 meq | EXTENDED_RELEASE_TABLET | Freq: Once | ORAL | Status: AC
  Administered 2024-09-02: 40 meq via ORAL
  Filled 2024-09-02: qty 2
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
## 2024-09-02 NOTE — Progress Notes (Signed)
 OT Cancellation Note  Patient Details Name: Jason Lewis MRN: [id number] DOB: [birth year]   Cancelled Treatment:    Reason Eval/Treat Not Completed: Active bedrest order   Sehaj Mcenroe M. [place], OTR/L [facility] Acute Rehabilitation Services [phone] Secure Chat Preferred  Jerret Mcbane 09/02/2024, 9:22 AM
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
## 2024-09-03 ENCOUNTER — Inpatient Hospital Stay (HOSPITAL_COMMUNITY)

## 2024-09-03 MED ORDER — PANTOPRAZOLE SODIUM 40 MG PO TBEC: 40.0000 mg | DELAYED_RELEASE_TABLET | Freq: Every day | ORAL | Status: DC

## 2024-09-03 MED ORDER — METOPROLOL SUCCINATE ER 25 MG PO TB24
25.0000 mg | ORAL_TABLET | Freq: Every day | ORAL | Status: AC
  Administered 2024-09-03: 25 mg via ORAL
  Filled 2024-09-03: qty 1

## 2024-09-03 MED ORDER — POLYETHYLENE GLYCOL 3350 17 G PO PACK: 17.0000 g | PACK | Freq: Every day | ORAL | Status: AC

## 2024-09-03 MED ORDER — VANCOMYCIN HCL 1500 MG/300ML IV SOLN
1500.0000 mg | Freq: Once | INTRAVENOUS | Status: AC
  Administered 2024-09-03: 1500 mg via INTRAVENOUS
  Filled 2024-09-03: qty 300
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
## 2024-09-03 NOTE — Progress Notes (Signed)
 Subjective: Patient reports doing well minimal headache  Objective: Vital signs in last 24 hours: Temp:  [98.2 F (36.8 C)-99.5 F (37.5 C)] 98.5 F (36.9 C) (02/06 0400) Pulse Rate:  [50-67] 50 (02/06 0700) Resp:  [12-25] 21 (02/06 0700) BP: (90-147)/(53-73) 130/61 (02/06 0700) SpO2:  [90 %-95 %] 95 % (02/06 0700)  Intake/Output from previous day: 02/05 0701 - 02/06 0700 In: 475 [IV Piggyback:475] Out: 1050 [Urine:1050] Intake/Output this shift: No intake/output data recorded.  Awake alert oriented strength 5 out of 5 incisions seem clean dry and intact  Lab Results: Recent Labs    09/01/24 1241 09/01/24 1259  WBC  --  19.2*  HGB 13.6 14.1  HCT 40.0 40.9  PLT  --  254   BMET Recent Labs    09/01/24 1259 09/02/24 0805  NA 138 137  K 3.2* 3.9  CL 101 102  CO2 22 25  GLUCOSE 183* 128*  BUN 17 18  CREATININE 1.06 0.93  CALCIUM  8.8* 8.6*    Studies/Results: CT HEAD WO CONTRAST ( ) Result Date: 09/03/2024 EXAM: CT HEAD WITHOUT CONTRAST 09/03/2024 03:33:03 AM TECHNIQUE: CT of the head was performed without the administration of intravenous contrast. Automated exposure control, iterative reconstruction, and/or weight based adjustment of the mA/kV was utilized to reduce the radiation dose to as low as reasonably achievable. COMPARISON: 09/21/2024 CLINICAL HISTORY: Stroke, follow up. Stroke, follow-up. FINDINGS: BRAIN AND VENTRICLES: Unchanged appearance of small subdural hematoma of the right convexity and small subdural hematoma within the right middle cranial fossa. Unchanged appearance of left temporal hemorrhagic contusion with associated pneumocephalus. Surrounding edema has mildly worsened. Subarachnoid hemorrhage over both hemispheres are unchanged, more evident on the left. There is a new area of focal hypoattenuation in the left frontal lobe that may indicate developing contusion. At this time, there is no hemorrhage at this site. No evidence of acute infarct. No  hydrocephalus. No mass effect or midline shift. SINUSES: Fluid in the left mastoid air cells. SOFT TISSUES AND SKULL: Unchanged nondisplaced right frontoparietal skull fracture. No acute soft tissue abnormality. IMPRESSION: 1. New area of focal hypoattenuation in the left frontal lobe, possibly indicating developing contusion. No hemorrhage at this site. 2. Unchanged appearance of left temporal hemorrhagic contusion with associated pneumocephalus. Surrounding edema has mildly worsened. 3. Unchanged subarachnoid hemorrhage over both hemispheres, more evident on the left. 4. Unchanged appearance of small subdural hematoma of the right convexity and small subdural hematoma within the right middle cranial fossa. 5. Unchanged nondisplaced right frontoparietal skull fracture. Electronically signed by: Franky Stanford MD 09/03/2024 03:56 AM EST RP Workstation: [id number]   CT HEAD WO CONTRAST Result Date: 09/01/2024 EXAM: CT HEAD WITHOUT CONTRAST 09/01/2024 08:08:00 PM TECHNIQUE: CT of the head was performed without the administration of intravenous contrast. Automated exposure control, iterative reconstruction, and/or weight based adjustment of the mA/kV was utilized to reduce the radiation dose to as low as reasonably achievable. COMPARISON: 09/01/2024 CLINICAL HISTORY: Subarachnoid hemorrhage [facility]). FINDINGS: BRAIN AND VENTRICLES: Unchanged extra-axial hemorrhage along the right middle cranial fossa, measuring up to 6 mm. Similar appearance of subarachnoid hemorrhage bilaterally. Increased edema and parenchymal contusion along the left temporal tip. Small volume pneumocephalus in the left middle cranial fossa. No evidence of acute infarct. No hydrocephalus. No mass effect or midline shift. ORBITS: No acute abnormality. SINUSES: Partial opacification of the left mastoid. No acute abnormality in other visualized sinuses. SOFT TISSUES AND SKULL: Redemonstrated nondisplaced right frontoparietal skull fracture. Skin staples  are present. No visualized left temporal  bone fracture, but the proximity of the left middle cranial fossa pneumocephalus to the mastoid air cells raises suspicion. Dedicated temporal bone CT recommended. IMPRESSION: 1. Similar appearance of subarachnoid hemorrhage bilaterally. 2. Unchanged extra-axial hemorrhage along the right middle cranial fossa, measuring up to 6 mm. 3. Increased edema and parenchymal contusion along the left temporal tip. 4. Redemonstrated nondisplaced right frontoparietal skull fracture. 5. Small volume pneumocephalus in the left middle cranial fossa and partial opacification of the left mastoid. No visualized left temporal bone fracture, but the proximity of the left middle cranial fossa pneumocephalus to the mastoid air cells raises suspicion. Dedicated temporal bone CT recommended. Electronically signed by: Franky Stanford MD 09/01/2024 08:23 PM EST RP Workstation: [id number]   CT HEAD WO CONTRAST Result Date: 09/01/2024 EXAM: CT HEAD AND FACIAL BONES 09/01/2024 01:03:05 PM TECHNIQUE: CT of the head and facial bones was performed without the administration of intravenous contrast. Multiplanar reformatted images are provided for review. Automated exposure control, iterative reconstruction, and/or weight based adjustment of the mA/kV was utilized to reduce the radiation dose to as low as reasonably achievable. COMPARISON: None available. CLINICAL HISTORY: Head trauma, moderate-severe. FINDINGS: CT HEAD BRAIN AND VENTRICLES: There are foci of hemorrhage along the anterior left frontal convexity and the anterior inferior right frontal convexity, which may represent small hemorrhagic contusions and/or traumatic subarachnoid hemorrhage. There are also hemorrhagic foci along the left middle cranial fossa pain overlying the left tegmen mastoidium. There is extra-axial hemorrhage along the anterior temporal region measuring up to 0.6 cm in thickness (series 3, image 15). There is an additional 0.5  cm right frontal convexity subdural hemorrhage with mild associated mass effect. No significant midline shift. No evidence of acute territorial infarct. No hydrocephalus. SKULL AND SCALP: There is a nondisplaced right frontoparietal calvarial fracture. There is soft tissue swelling and hematoma involving the left frontal scalp, bilateral temporal scalp, and posterior scalp. There is subcutaneous air possibly associated with laceration involving the right pinna. CT FACIAL BONES FACIAL BONES: No acute facial fracture. No mandibular dislocation. No suspicious bone lesion. ORBITS: No acute traumatic injury. SINUSES AND MASTOIDS: Hypoplastic left maxillary sinus. There are mild left mastoid effusion. SOFT TISSUES: There is subcutaneous air possibly associated with laceration involving the right pinna. IMPRESSION: 1. A 0.5 cm right cerebral convexity extra-axial hemorrhage subjacent to the nondisplaced calvarial fracture could represent epidural hematoma. 2. Nondisplaced right frontoparietal calvarial fracture. 3. 0.6 cm extra-axial hemorrhage anterior right middle cranial fossa. 4. Multiple foci of hemorrhagic contusions involving the bilateral frontal and temporal lobes. 5. No acute facial bone fracture. 6. Soft tissue swelling and hematoma involving the left frontal, bilateral temporal, and posterior scalp, with subcutaneous air likely related to laceration at the right pinna. 7. These findings were communicated to Dr. Dreama at 1:52 PM. Electronically signed by: Prentice Spade MD 09/01/2024 02:25 PM EST RP Workstation: [id number]   CT MAXILLOFACIAL WO CONTRAST Result Date: 09/01/2024 EXAM: CT HEAD AND FACIAL BONES 09/01/2024 01:03:05 PM TECHNIQUE: CT of the head and facial bones was performed without the administration of intravenous contrast. Multiplanar reformatted images are provided for review. Automated exposure control, iterative reconstruction, and/or weight based adjustment of the mA/kV was utilized to  reduce the radiation dose to as low as reasonably achievable. COMPARISON: None available. CLINICAL HISTORY: Head trauma, moderate-severe. FINDINGS: CT HEAD BRAIN AND VENTRICLES: There are foci of hemorrhage along the anterior left frontal convexity and the anterior inferior right frontal convexity, which may represent small hemorrhagic contusions and/or traumatic subarachnoid hemorrhage.  There are also hemorrhagic foci along the left middle cranial fossa pain overlying the left tegmen mastoidium. There is extra-axial hemorrhage along the anterior temporal region measuring up to 0.6 cm in thickness (series 3, image 15). There is an additional 0.5 cm right frontal convexity subdural hemorrhage with mild associated mass effect. No significant midline shift. No evidence of acute territorial infarct. No hydrocephalus. SKULL AND SCALP: There is a nondisplaced right frontoparietal calvarial fracture. There is soft tissue swelling and hematoma involving the left frontal scalp, bilateral temporal scalp, and posterior scalp. There is subcutaneous air possibly associated with laceration involving the right pinna. CT FACIAL BONES FACIAL BONES: No acute facial fracture. No mandibular dislocation. No suspicious bone lesion. ORBITS: No acute traumatic injury. SINUSES AND MASTOIDS: Hypoplastic left maxillary sinus. There are mild left mastoid effusion. SOFT TISSUES: There is subcutaneous air possibly associated with laceration involving the right pinna. IMPRESSION: 1. A 0.5 cm right cerebral convexity extra-axial hemorrhage subjacent to the nondisplaced calvarial fracture could represent epidural hematoma. 2. Nondisplaced right frontoparietal calvarial fracture. 3. 0.6 cm extra-axial hemorrhage anterior right middle cranial fossa. 4. Multiple foci of hemorrhagic contusions involving the bilateral frontal and temporal lobes. 5. No acute facial bone fracture. 6. Soft tissue swelling and hematoma involving the left frontal, bilateral  temporal, and posterior scalp, with subcutaneous air likely related to laceration at the right pinna. 7. These findings were communicated to Dr. Dreama at 1:52 PM. Electronically signed by: Prentice Spade MD 09/01/2024 02:25 PM EST RP Workstation: [id number]   CT CHEST ABDOMEN PELVIS W CONTRAST Result Date: 09/01/2024 EXAM: CT CHEST, ABDOMEN AND PELVIS WITH CONTRAST 09/01/2024 01:03:05 PM TECHNIQUE: CT of the chest, abdomen and pelvis was performed with the administration of 75 mL of iohexol  (OMNIPAQUE ) 350 MG/ML intravenous contrast. Multiplanar reformatted images are provided for review. Automated exposure control, iterative reconstruction, and/or weight based adjustment of the mA/kV was utilized to reduce the radiation dose to as low as reasonably achievable. COMPARISON: 06/06/2017. CLINICAL HISTORY: Polytrauma, blunt. FINDINGS: CHEST: MEDIASTINUM AND LYMPH NODES: Multivessel coronary atherosclerosis. Coronary artery bypass graft (CABG) changes. Sternal wires. Small hiatal hernia. Calcified mediastinal and hilar lymph nodes are consistent with chronic granulomatous infection. Heart and pericardium are unremarkable. The central airways are clear. No axillary lymphadenopathy. LUNGS AND PLEURA: Calcified granuloma in the right middle lobe. Posterior bibasilar dependent atelectasis. No pleural effusion. No pneumothorax. ABDOMEN AND PELVIS: LIVER: Unremarkable. GALLBLADDER AND BILE DUCTS: Unremarkable. No biliary ductal dilatation. SPLEEN: No acute abnormality. PANCREAS: No acute abnormality. ADRENAL GLANDS: No acute abnormality. KIDNEYS, URETERS AND BLADDER: 5.3 cm right renal cyst. Per consensus, no follow-up is needed for simple Bosniak type 1 and 2 renal cysts, unless the patient has a malignancy history or risk factors. Decompressed urinary bladder. No stones in the kidneys or ureters. No hydronephrosis. No perinephric or periureteral stranding. GI AND BOWEL: Small hiatal hernia. Stomach demonstrates no  acute abnormality. Decompressed normal appendix. There is no bowel obstruction. REPRODUCTIVE ORGANS: Posteriorly visualized right sided hydrocele. No prostatomegaly. No free pelvic fluid. PERITONEUM AND RETROPERITONEUM: No ascites. No free air. VASCULATURE: Scattered aortoiliac atherosclerosis. Aorta is normal in caliber. ABDOMINAL AND PELVIS LYMPH NODES: No lymphadenopathy. BONES AND SOFT TISSUES: Mildly decreased bony mineralization. Multilevel degenerative disc disease throughout the spine. Advanced facet arthropathy throughout the lumbar spine. No acute osseous abnormality. No focal soft tissue abnormality. IMPRESSION: 1. No acute, traumatic injury within the chest, abdomen, or pelvis. Electronically signed by: Rogelia Myers MD 09/01/2024 01:48 PM EST RP Workstation: [id number]  CT CERVICAL SPINE WO CONTRAST Result Date: 09/01/2024 EXAM: CT CERVICAL SPINE WITHOUT CONTRAST 09/01/2024 01:03:05 PM TECHNIQUE: CT of the cervical spine was performed without the administration of intravenous contrast. Multiplanar reformatted images are provided for review. Automated exposure control, iterative reconstruction, and/or weight based adjustment of the mA/kV was utilized to reduce the radiation dose to as low as reasonably achievable. COMPARISON: None available. CLINICAL HISTORY: Polytrauma, blunt. FINDINGS: BONES AND ALIGNMENT: Trace degenerative anterolisthesis of C2 on C3. Trace degenerative retrolisthesis of C4 on C5. No evidence of traumatic malalignment. No evidence of acute fracture in the cervical spine. DEGENERATIVE CHANGES: Disc space narrowing greatest at C4-C5 and at C6-C7. Disc osteophyte complex at C6-C7 resulting in mild osseous spinal canal stenosis. Facet arthrosis and uncovertebral hypertrophy at multiple levels. There is foraminal stenosis noted at multiple levels particularly at C4-C5, C5-C6, and C6-C7. SOFT TISSUES: Atherosclerosis of the carotid bifurcations. There is focal soft tissue swelling in  the posterior neck at the C1-C2 level right of midline series 5 image 32. No prevertebral soft tissue swelling. IMPRESSION: 1. No evidence of acute traumatic injury. 2. Focal soft tissue swelling in the posterior neck at the C1-C2 level, right of midline. Electronically signed by: Donnice Mania MD 09/01/2024 01:36 PM EST RP Workstation: [id number]   DG Pelvis Portable Result Date: 09/01/2024 EXAM: 1 or 2 VIEW(S) XRAY OF THE PELVIS 09/01/2024 12:33:00 PM COMPARISON: Comparison CT 06/06/2017. CLINICAL HISTORY: Trauma. FINDINGS: BONES AND JOINTS: No acute fracture. No malalignment. Degenerative changes of the visualized lower lumbar spine. SOFT TISSUES: Unremarkable. IMPRESSION: 1. No evidence of acute traumatic injury. Electronically signed by: Katheleen Faes MD 09/01/2024 12:48 PM EST RP Workstation: [id number]   DG Chest Port 1 View Result Date: 09/01/2024 EXAM: 1 VIEW(S) XRAY OF THE CHEST 09/01/2024 12:33:00 PM COMPARISON: 06/25/2017. CLINICAL HISTORY: Trauma. FINDINGS: LINES, TUBES AND DEVICES: Post Coronary Artery Bypass Graft (CABG). Mediastinal surgical clips noted. Intact sternotomy wires. LUNGS AND PLEURA: Low lung volumes. No focal pulmonary opacity. No pleural effusion. No pneumothorax. HEART AND MEDIASTINUM: Minimal aortic calcified plaque. No acute abnormality of the cardiac and mediastinal silhouettes. BONES AND SOFT TISSUES: No acute osseous abnormality. IMPRESSION: 1. No acute findings. Electronically signed by: Dayne Hassell MD 09/01/2024 12:47 PM EST RP Workstation: [id number]    Assessment/Plan: 74 year old gentleman now hospital day 2 closed head injury with right anterior tip epidural hematoma left temporal hemorrhagic contusion stable on follow-up scans this morning scans also look stable with slight evolution of the left-sided temporal contusion.  Will allow the patient to be transferred out of the ICU onto the progressive unit will observe another 24 to 48 hours and probably if patient  cleared by therapy can be discharged home at that point.  LOS: 2 days     Jason Lewis Helling 09/03/2024, 7:38 AM
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
## 2025-04-28 ENCOUNTER — Other Ambulatory Visit (HOSPITAL_COMMUNITY)
